# Patient Record
Sex: Male | Born: 1965
Health system: Southern US, Community
[De-identification: ages and names within clinical notes are randomized; demographics above are authoritative.]

## PROBLEM LIST (undated history)

## (undated) DIAGNOSIS — K219 Gastro-esophageal reflux disease without esophagitis: Secondary | ICD-10-CM

## (undated) DIAGNOSIS — Z8489 Family history of other specified conditions: Secondary | ICD-10-CM

## (undated) DIAGNOSIS — E785 Hyperlipidemia, unspecified: Secondary | ICD-10-CM

## (undated) DIAGNOSIS — G4730 Sleep apnea, unspecified: Secondary | ICD-10-CM

## (undated) DIAGNOSIS — M2669 Other specified disorders of temporomandibular joint: Secondary | ICD-10-CM

## (undated) DIAGNOSIS — R011 Cardiac murmur, unspecified: Secondary | ICD-10-CM

## (undated) DIAGNOSIS — F419 Anxiety disorder, unspecified: Secondary | ICD-10-CM

## (undated) DIAGNOSIS — E119 Type 2 diabetes mellitus without complications: Secondary | ICD-10-CM

## (undated) DIAGNOSIS — I10 Essential (primary) hypertension: Secondary | ICD-10-CM

## (undated) HISTORY — DX: Gastro-esophageal reflux disease without esophagitis: K21.9

## (undated) HISTORY — PX: COLONOSCOPY: SHX174

## (undated) HISTORY — DX: Other specified disorders of temporomandibular joint: M26.69

## (undated) HISTORY — DX: Hyperlipidemia, unspecified: E78.5

## (undated) HISTORY — DX: Essential (primary) hypertension: I10

## (undated) HISTORY — PX: WISDOM TOOTH EXTRACTION: SHX21

## (undated) HISTORY — DX: Type 2 diabetes mellitus without complications: E11.9

## (undated) HISTORY — DX: Cardiac murmur, unspecified: R01.1

---
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
## 2009-08-27 DIAGNOSIS — E785 Hyperlipidemia, unspecified: Secondary | ICD-10-CM

## 2009-08-27 HISTORY — DX: Hyperlipidemia, unspecified: E78.5

## 2011-07-09 ENCOUNTER — Telehealth: Payer: Self-pay | Admitting: Internal Medicine

## 2011-07-09 ENCOUNTER — Encounter: Payer: Self-pay | Admitting: Internal Medicine

## 2011-07-09 ENCOUNTER — Ambulatory Visit (INDEPENDENT_AMBULATORY_CARE_PROVIDER_SITE_OTHER): Payer: 59 | Admitting: Internal Medicine

## 2011-07-09 VITALS — BP 100/70 | HR 83 | Temp 98.3°F | Resp 18 | Ht 68.0 in | Wt 184.0 lb

## 2011-07-09 DIAGNOSIS — E785 Hyperlipidemia, unspecified: Secondary | ICD-10-CM

## 2011-07-09 DIAGNOSIS — Z79899 Other long term (current) drug therapy: Secondary | ICD-10-CM

## 2011-07-09 DIAGNOSIS — R229 Localized swelling, mass and lump, unspecified: Secondary | ICD-10-CM

## 2011-07-09 DIAGNOSIS — R2230 Localized swelling, mass and lump, unspecified upper limb: Secondary | ICD-10-CM

## 2011-07-09 DIAGNOSIS — I10 Essential (primary) hypertension: Secondary | ICD-10-CM

## 2011-07-09 HISTORY — PX: NO PAST SURGERIES: SHX2092

## 2011-07-09 NOTE — Telephone Encounter (Signed)
Lab orders entered for February 2013. 

## 2011-07-09 NOTE — Patient Instructions (Signed)
Please schedule cbc 401.9, chem7 v58.69 and lipid/lft 272.4 prior to next visit

## 2011-07-11 DIAGNOSIS — I10 Essential (primary) hypertension: Secondary | ICD-10-CM | POA: Insufficient documentation

## 2011-07-11 NOTE — Assessment & Plan Note (Signed)
Clinical suspicion for possible benign growth however given axillary location cannot exclude more serious etiology. Discussed further evaluation and recommend general surgery consult for consideration of possible excision.

## 2011-07-11 NOTE — Assessment & Plan Note (Signed)
Normotensive and stable. Continue current regimen. Monitor bp as outpt and followup in clinic as scheduled.  

## 2011-07-11 NOTE — Progress Notes (Signed)
  Subjective:    Patient ID: Shawn Perez, male    DOB: [birth year], 45 y.o.   MRN: [id number]  HPI Pt presents to clinic for evaluation of soft tissue mass. Notes one year h/o right axillary ST mass progressively increasing in size. Area is nontender and without drainage. Denies fever, sweats, fatigue or wt loss and notes no other areas of mass/enlargement. States has undergone ?CT of the area vs ct of chest apparently unremarkable. No alleviating or exacerbating factors. H/o HTN maintained on arb with good control and no side effects. H/o hyperlipidemia maintained on daily medication unspecified but thought to be a statin. No associated myalgias. Declines flu vaccine. No other complaints.  Past Medical History  Diagnosis Date  . Heart murmur     since birth  . Hypertension   . Hyperlipidemia 2011   Past Surgical History  Procedure Date  . No past surgeries 07/09/2011    Denies surgical history    reports that he has quit smoking. He has never used smokeless tobacco. He reports that he drinks alcohol. He reports that he does not use illicit drugs. family history includes Diabetes in his mother and Hypertension in his mother. Allergies  Allergen Reactions  . Penicillins     Rash       Review of Systems  Constitutional: Negative for fever, chills, diaphoresis, fatigue and unexpected weight change.  All other systems reviewed and are negative.       Objective:   Physical Exam  Nursing note and vitals reviewed. Constitutional: He appears well-developed and well-nourished.  HENT:  Head: Normocephalic and atraumatic.  Right Ear: External ear normal.  Left Ear: External ear normal.  Eyes: Conjunctivae are normal. No scleral icterus.  Neck: Neck supple.  Cardiovascular: Normal rate, regular rhythm and normal heart sounds.   Pulmonary/Chest: Effort normal and breath sounds normal. No respiratory distress. He has no wheezes. He has no rales.  Lymphadenopathy:    He has no  cervical adenopathy.  Neurological: He is alert.  Skin: Skin is warm and dry. He is not diaphoretic.       Right axilla: ST mass ~2cm. Mobile and nt. No expressible discharge. Well circumscribed.  Psychiatric: He has a normal mood and affect.          Assessment & Plan:

## 2011-07-24 ENCOUNTER — Ambulatory Visit (INDEPENDENT_AMBULATORY_CARE_PROVIDER_SITE_OTHER): Payer: 59 | Admitting: General Surgery

## 2011-07-30 ENCOUNTER — Ambulatory Visit (INDEPENDENT_AMBULATORY_CARE_PROVIDER_SITE_OTHER): Payer: 59 | Admitting: Surgery

## 2011-08-10 ENCOUNTER — Ambulatory Visit (INDEPENDENT_AMBULATORY_CARE_PROVIDER_SITE_OTHER): Payer: 59 | Admitting: Surgery

## 2011-08-13 ENCOUNTER — Encounter (INDEPENDENT_AMBULATORY_CARE_PROVIDER_SITE_OTHER): Payer: Self-pay | Admitting: Surgery

## 2011-08-13 ENCOUNTER — Ambulatory Visit (INDEPENDENT_AMBULATORY_CARE_PROVIDER_SITE_OTHER): Payer: 59 | Admitting: Surgery

## 2011-08-13 VITALS — BP 126/78 | HR 68 | Temp 97.4°F | Resp 18 | Ht 68.0 in | Wt 181.2 lb

## 2011-08-13 DIAGNOSIS — L723 Sebaceous cyst: Secondary | ICD-10-CM

## 2011-08-13 NOTE — Progress Notes (Signed)
Subjective:     Patient ID: Shawn Perez, male   DOB: 1966/04/04, 45 y.o.   MRN: [id number]  HPI This is a pleasant gentleman referred by Dr. Rodena Medin for evaluation of a small right axillary mass. The patient reports it has been there over a year. It has got slightly larger. It causes no discomfort. He has had no drainage. He is otherwise without complaints.  Review of Systems Unremarkable    Objective:   Physical Exam    On exam, his lungs are clear bilaterally. Cardiovascular is regular rate and rhythm. There is a small 1-1/2 cm subcutaneous nodule in the skin of the right axilla. It is consistent with a sebaceous cyst. There is no erythema. There is no axillary adenopathy Assessment:     Patient with right axilla sebaceous cyst    Plan:     I discussed the diagnosis with the patient. We are going to follow this expectantly as he has no symptoms. I did discuss removal with him. As this is superficial I believe I can do in the office. He will call me back if he wants me to remove the mass

## 2011-09-28 ENCOUNTER — Ambulatory Visit (INDEPENDENT_AMBULATORY_CARE_PROVIDER_SITE_OTHER): Payer: BC Managed Care – PPO | Admitting: Internal Medicine

## 2011-09-28 ENCOUNTER — Telehealth: Payer: Self-pay | Admitting: Internal Medicine

## 2011-09-28 ENCOUNTER — Encounter: Payer: Self-pay | Admitting: Internal Medicine

## 2011-09-28 VITALS — BP 110/62 | HR 76 | Temp 97.7°F | Resp 18 | Wt 181.0 lb

## 2011-09-28 DIAGNOSIS — R109 Unspecified abdominal pain: Secondary | ICD-10-CM

## 2011-09-28 DIAGNOSIS — E785 Hyperlipidemia, unspecified: Secondary | ICD-10-CM

## 2011-09-28 LAB — BASIC METABOLIC PANEL
BUN: 24 mg/dL — ABNORMAL HIGH (ref 6–23)
Calcium: 10.3 mg/dL (ref 8.4–10.5)
Creat: 1.17 mg/dL (ref 0.50–1.35)
Glucose, Bld: 96 mg/dL (ref 70–99)
Potassium: 4.3 mEq/L (ref 3.5–5.3)

## 2011-09-28 LAB — CBC WITH DIFFERENTIAL/PLATELET
Basophils Absolute: 0 10*3/uL (ref 0.0–0.1)
Basophils Relative: 0 % (ref 0–1)
Eosinophils Absolute: 0.1 10*3/uL (ref 0.0–0.7)
HCT: 43.7 % (ref 39.0–52.0)
Hemoglobin: 14.6 g/dL (ref 13.0–17.0)
MCH: 31.1 pg (ref 26.0–34.0)
MCHC: 33.4 g/dL (ref 30.0–36.0)
Monocytes Absolute: 0.6 10*3/uL (ref 0.1–1.0)
Monocytes Relative: 9 % (ref 3–12)
Neutro Abs: 3.8 10*3/uL (ref 1.7–7.7)
RDW: 12.3 % (ref 11.5–15.5)

## 2011-09-28 MED ORDER — VALSARTAN 160 MG PO TABS: 160.0000 mg | ORAL_TABLET | Freq: Every day | ORAL | Status: DC

## 2011-09-28 MED ORDER — CHOLINE FENOFIBRATE 135 MG PO CPDR: 135.0000 mg | DELAYED_RELEASE_CAPSULE | Freq: Every day | ORAL | Status: DC

## 2011-09-28 MED ORDER — LEVOFLOXACIN 500 MG PO TABS: 500.0000 mg | ORAL_TABLET | Freq: Every day | ORAL | Status: AC

## 2011-09-28 NOTE — Patient Instructions (Signed)
Please schedule lipid/lft 272.4 prior to next visit  

## 2011-09-29 LAB — URINALYSIS, ROUTINE W REFLEX MICROSCOPIC
Bilirubin Urine: NEGATIVE
Nitrite: NEGATIVE
Protein, ur: NEGATIVE mg/dL
Urobilinogen, UA: 1 mg/dL (ref 0.0–1.0)

## 2011-10-01 ENCOUNTER — Encounter: Payer: Self-pay | Admitting: Internal Medicine

## 2011-10-04 ENCOUNTER — Telehealth: Payer: Self-pay | Admitting: *Deleted

## 2011-10-04 NOTE — Telephone Encounter (Signed)
Ben called back stating he checked with pt's previous pharmacy and verified they have not filled plain Diovan in the last 6 months. Last refill was 09/12/11 for diovan HCT 160/12.5mg . Please advise.

## 2011-10-04 NOTE — Telephone Encounter (Signed)
Received call from [place] at [facility] pharmacy stating pt told him he is supposed to be taking Diovan HCT 160/12.5mg  instead of Diovan 160mg . Historical medication shows that Diovan HCT was removed from med list in November (reason: error?) and Diovan 160mg  was added in its place. Please advise if it is ok to change med list to Diovan HCT or should pt really be taking Diovan 160mg ? Please advise.

## 2011-10-05 MED ORDER — VALSARTAN-HYDROCHLOROTHIAZIDE 160-12.5 MG PO TABS: 1.0000 | ORAL_TABLET | Freq: Every day | ORAL | Status: DC

## 2011-10-05 NOTE — Telephone Encounter (Signed)
Refill sent to pharmacy for Diovan HCT and medication list has been updated.

## 2011-10-05 NOTE — Telephone Encounter (Signed)
pls fill as diovan hct what he has been receiving from the pharmacy. Changed 11/18 by ?cna i don't recognize

## 2011-10-07 DIAGNOSIS — R109 Unspecified abdominal pain: Secondary | ICD-10-CM | POA: Insufficient documentation

## 2011-10-07 NOTE — Assessment & Plan Note (Signed)
Obtain cbc, chem7, ua. Attempt empiric levaquin x10d. Schedule close follow up.

## 2011-10-07 NOTE — Progress Notes (Signed)
  Subjective:    Patient ID: Shawn Perez, male    DOB: 26-Jul-1966, 46 y.o.   MRN: [id number]  HPI Pt presents to clinic for evaluation of back pain. Notes 34month h/o right lbp that radiates to groin. Initially began as rlq/inguinal pain that radiated to right testicle/scrotum. Then progressed to rlb pain with possible mild radiation down right leg without leg weakness, injury or paresthesia. Denies f/c, urethral discharge, or urinary sx's. No other alleviating or exacerbating factors. No other complaints.  Past Medical History  Diagnosis Date  . Heart murmur     since birth  . Hypertension   . Hyperlipidemia 2011   Past Surgical History  Procedure Date  . No past surgeries 07/09/2011    Denies surgical history    reports that he has quit smoking. He has never used smokeless tobacco. He reports that he drinks alcohol. He reports that he does not use illicit drugs. family history includes Diabetes in his mother and Hypertension in his mother. Allergies  Allergen Reactions  . Penicillins     Rash     Review of Systems see hpi     Objective:   Physical Exam  Nursing note and vitals reviewed. Constitutional: He appears well-developed and well-nourished. No distress.  HENT:  Head: Normocephalic and atraumatic.  Right Ear: External ear normal.  Left Ear: External ear normal.  Eyes: Conjunctivae are normal. No scleral icterus.  Neck: Neck supple.  Abdominal: Soft. Bowel sounds are normal. He exhibits no distension and no mass. There is no tenderness. There is no rebound and no guarding.  Genitourinary:       Possible mild discomfort to palptation along right epididymis. No testicular mass  Neurological: He is alert.  Skin: Skin is warm and dry. He is not diaphoretic.  Psychiatric: He has a normal mood and affect.          Assessment & Plan:

## 2011-10-07 NOTE — Assessment & Plan Note (Signed)
Obtain lipid/lft prior to next visit 

## 2011-10-08 ENCOUNTER — Ambulatory Visit: Payer: 59 | Admitting: Internal Medicine

## 2011-10-09 ENCOUNTER — Telehealth (INDEPENDENT_AMBULATORY_CARE_PROVIDER_SITE_OTHER): Payer: Self-pay | Admitting: Surgery

## 2011-10-09 ENCOUNTER — Encounter (INDEPENDENT_AMBULATORY_CARE_PROVIDER_SITE_OTHER): Payer: Self-pay | Admitting: Surgery

## 2011-10-09 ENCOUNTER — Ambulatory Visit (INDEPENDENT_AMBULATORY_CARE_PROVIDER_SITE_OTHER): Payer: BC Managed Care – PPO | Admitting: Surgery

## 2011-10-09 VITALS — BP 108/66 | HR 70 | Temp 97.8°F | Resp 18 | Ht 68.0 in | Wt 182.6 lb

## 2011-10-09 DIAGNOSIS — L089 Local infection of the skin and subcutaneous tissue, unspecified: Secondary | ICD-10-CM

## 2011-10-09 DIAGNOSIS — L723 Sebaceous cyst: Secondary | ICD-10-CM

## 2011-10-09 NOTE — Progress Notes (Signed)
Subjective:     Patient ID: Shawn Perez, male   DOB: 1966/03/23, 46 y.o.   MRN: [id number]  HPI This is a gentleman that I saw with an asymptomatic sebaceous cyst in his right axilla last year. He now presents with an infected.  Review of Systems     Objective:   Physical Exam On examination, there is erythema and abscess with infected sebaceous cyst in the right axilla    Assessment:     Infected sebaceous cyst    Plan:     After obtaining consent, I cleaned the area Betadine. I then anesthetized with lidocaine. I made an incision with the scalpel. I then drained the purulence and sebaceous debris. I was also able to excise a significant amount of the capsule. I then packed the wound with gauze. I did start him on antibiotics. I also wrote for hydrocodone. I will see him back next Monday.

## 2011-10-15 ENCOUNTER — Encounter (INDEPENDENT_AMBULATORY_CARE_PROVIDER_SITE_OTHER): Payer: Self-pay | Admitting: Surgery

## 2011-10-15 ENCOUNTER — Ambulatory Visit (INDEPENDENT_AMBULATORY_CARE_PROVIDER_SITE_OTHER): Payer: BC Managed Care – PPO | Admitting: Surgery

## 2011-10-15 VITALS — BP 128/80 | HR 77 | Temp 97.5°F | Resp 18 | Ht 68.0 in | Wt 184.2 lb

## 2011-10-15 DIAGNOSIS — Z09 Encounter for follow-up examination after completed treatment for conditions other than malignant neoplasm: Secondary | ICD-10-CM

## 2011-10-15 NOTE — Progress Notes (Signed)
Subjective:     Patient ID: Shawn Perez, male   DOB: 1966/07/08, 46 y.o.   MRN: [id number]  HPI He is here today for recheck of the infected sebaceous cyst which was drained last week. He is doing well and has no complaints. He is finishing his course of antibiotics  Review of Systems     Objective:   Physical Exam On exam, the incision is healing well. There is no erythema or purulence    Assessment:     Infected sebaceous cyst status post incision and drainage    Plan:     He will see me back in one month. If the area of recurrence, he will need to be formally excised

## 2011-10-16 ENCOUNTER — Other Ambulatory Visit: Payer: Self-pay | Admitting: *Deleted

## 2011-10-16 ENCOUNTER — Encounter: Payer: Self-pay | Admitting: Internal Medicine

## 2011-10-16 ENCOUNTER — Ambulatory Visit (INDEPENDENT_AMBULATORY_CARE_PROVIDER_SITE_OTHER): Payer: BC Managed Care – PPO | Admitting: Internal Medicine

## 2011-10-16 ENCOUNTER — Telehealth: Payer: Self-pay | Admitting: Internal Medicine

## 2011-10-16 VITALS — BP 100/60 | HR 48 | Temp 97.7°F | Resp 16 | Ht 68.0 in | Wt 182.0 lb

## 2011-10-16 DIAGNOSIS — Z79899 Other long term (current) drug therapy: Secondary | ICD-10-CM

## 2011-10-16 DIAGNOSIS — N419 Inflammatory disease of prostate, unspecified: Secondary | ICD-10-CM

## 2011-10-16 DIAGNOSIS — E785 Hyperlipidemia, unspecified: Secondary | ICD-10-CM

## 2011-10-16 DIAGNOSIS — I10 Essential (primary) hypertension: Secondary | ICD-10-CM

## 2011-10-16 DIAGNOSIS — I499 Cardiac arrhythmia, unspecified: Secondary | ICD-10-CM

## 2011-10-16 LAB — LIPID PANEL
Cholesterol: 187 mg/dL (ref 0–200)
Triglycerides: 185 mg/dL — ABNORMAL HIGH (ref <150)
VLDL: 37 mg/dL (ref 0–40)

## 2011-10-16 LAB — BASIC METABOLIC PANEL
BUN: 23 mg/dL (ref 6–23)
Creat: 1.25 mg/dL (ref 0.50–1.35)
Potassium: 4.9 mEq/L (ref 3.5–5.3)

## 2011-10-16 LAB — HEPATIC FUNCTION PANEL
ALT: 30 U/L (ref 0–53)
AST: 23 U/L (ref 0–37)
Alkaline Phosphatase: 50 U/L (ref 39–117)
Indirect Bilirubin: 0.4 mg/dL (ref 0.0–0.9)
Total Protein: 7 g/dL (ref 6.0–8.3)

## 2011-10-16 NOTE — Telephone Encounter (Signed)
Lab orders entered for July 2013. 

## 2011-10-16 NOTE — Progress Notes (Signed)
  Subjective:    Patient ID: Shawn Perez, male    DOB: 19-Aug-1966, 46 y.o.   MRN: [id number]  HPI Pt presents to clinic for followup of multiple medical problems. Recently has infected sebaceous cyst now s/p I&D by surgery. Near the end of course of septra per surgery-notes mild nausea with medication. Seen two weeks ago with possible prostatitis vs epididymitis tx'ed with 10 day course of levaquin. Tolerated medication without adverse effect. Sx's resolved 2-3 days ago. No back pain, testicular or groin pain. Heartbeat noted with brief irregularity. asx without palpitations.   Past Medical History  Diagnosis Date  . Heart murmur     since birth  . Hypertension   . Hyperlipidemia 2011  . Sebaceous cyst     axillary   Past Surgical History  Procedure Date  . No past surgeries 07/09/2011    Denies surgical history    reports that he has quit smoking. He has never used smokeless tobacco. He reports that he drinks alcohol. He reports that he does not use illicit drugs. family history includes Diabetes in his mother and Hypertension in his mother. Allergies  Allergen Reactions  . Penicillins Rash    Happened in childhood      Review of Systems see hpi     Objective:   Physical Exam  Nursing note and vitals reviewed. Constitutional: He appears well-developed and well-nourished.  HENT:  Head: Normocephalic and atraumatic.  Neurological: He is alert.  Psychiatric: He has a normal mood and affect.          Assessment & Plan:

## 2011-10-16 NOTE — Assessment & Plan Note (Signed)
Resolved s/p abx. °

## 2011-10-16 NOTE — Assessment & Plan Note (Signed)
Obtain lipid/lft. 

## 2011-10-16 NOTE — Assessment & Plan Note (Signed)
EKG obtained shows NSR with nl intervals and axis. No arrythmia. Pt asx without palpitations. May have represented transient pac or pvc. If develops palpitations proceed with cardiac monitor.

## 2011-10-16 NOTE — Patient Instructions (Signed)
Please schedule chem7-v58.69 and lipid/lft 272.4 prior to next visit 

## 2011-10-17 LAB — CBC
MCV: 92.1 fL (ref 78.0–100.0)
Platelets: 318 10*3/uL (ref 150–400)
RDW: 12.6 % (ref 11.5–15.5)
WBC: 4.4 10*3/uL (ref 4.0–10.5)

## 2011-10-24 ENCOUNTER — Other Ambulatory Visit: Payer: Self-pay | Admitting: Internal Medicine

## 2011-10-24 DIAGNOSIS — R739 Hyperglycemia, unspecified: Secondary | ICD-10-CM

## 2011-11-06 ENCOUNTER — Encounter (INDEPENDENT_AMBULATORY_CARE_PROVIDER_SITE_OTHER): Payer: BC Managed Care – PPO | Admitting: Surgery

## 2011-11-30 ENCOUNTER — Ambulatory Visit (INDEPENDENT_AMBULATORY_CARE_PROVIDER_SITE_OTHER): Payer: BC Managed Care – PPO | Admitting: Surgery

## 2011-11-30 ENCOUNTER — Encounter (INDEPENDENT_AMBULATORY_CARE_PROVIDER_SITE_OTHER): Payer: Self-pay | Admitting: Surgery

## 2011-11-30 VITALS — BP 122/81 | HR 68 | Temp 97.4°F | Resp 14 | Ht 68.0 in | Wt 180.4 lb

## 2011-11-30 DIAGNOSIS — L723 Sebaceous cyst: Secondary | ICD-10-CM

## 2011-11-30 NOTE — Progress Notes (Signed)
Subjective:     Patient ID: Shawn Perez, male   DOB: 1966-07-14, 46 y.o.   MRN: [id number]  HPI He is doing well and has no complaints. He does not feel that the cyst is recurred  Review of Systems     Objective:   Physical Exam On exam, the incision in the axilla is well healed. There is no evidence of recurrent sebaceous cyst    Assessment:     Patient status post incision and drainage of sebaceous cyst    Plan:     I will see him back as needed. He will return should he notice a cyst

## 2011-12-13 ENCOUNTER — Telehealth: Payer: Self-pay | Admitting: *Deleted

## 2011-12-13 MED ORDER — LEVOFLOXACIN 500 MG PO TABS: 500.0000 mg | ORAL_TABLET | Freq: Every day | ORAL | Status: AC

## 2011-12-13 NOTE — Telephone Encounter (Signed)
Rx sent to pharmacy and pt notified  ?

## 2011-12-13 NOTE — Telephone Encounter (Signed)
Received message from pt stating his UTI has returned. Pt states he was told that if his symptoms returned to call us and we would send in an abx. Please advise.

## 2011-12-13 NOTE — Telephone Encounter (Signed)
levaquin 500mg  po qd x 10d

## 2011-12-26 ENCOUNTER — Telehealth: Payer: Self-pay | Admitting: *Deleted

## 2011-12-26 NOTE — Telephone Encounter (Signed)
Received call from pt's wife stating pt reported episode yesterday of sharp chest pain radiating to left arm. Reports family history of heart disease. Pt's wife states pt is at work today but wanted to schedule an appt for tomorrow.  Pt reported that he has only had some chest discomfort today, denies n/v or shortness of breath. Discussed request with Dr Rodena Medin, he advised that it is ok to give pt appt for tomorrow but advised pt's wife that pt should be evaluated in the ER if he has further chest pain and not to wait until appt. Pt's wife voices understanding.

## 2011-12-27 ENCOUNTER — Ambulatory Visit (INDEPENDENT_AMBULATORY_CARE_PROVIDER_SITE_OTHER): Payer: BC Managed Care – PPO | Admitting: Internal Medicine

## 2011-12-27 ENCOUNTER — Ambulatory Visit (HOSPITAL_BASED_OUTPATIENT_CLINIC_OR_DEPARTMENT_OTHER)
Admission: RE | Admit: 2011-12-27 | Discharge: 2011-12-27 | Disposition: A | Discharge status: Home / Self Care | Payer: BC Managed Care – PPO | Source: Ambulatory Visit | Attending: Internal Medicine | Admitting: Internal Medicine

## 2011-12-27 ENCOUNTER — Encounter: Payer: Self-pay | Admitting: Internal Medicine

## 2011-12-27 DIAGNOSIS — R011 Cardiac murmur, unspecified: Secondary | ICD-10-CM

## 2011-12-27 DIAGNOSIS — R079 Chest pain, unspecified: Secondary | ICD-10-CM

## 2011-12-27 DIAGNOSIS — R0602 Shortness of breath: Secondary | ICD-10-CM

## 2011-12-27 DIAGNOSIS — I10 Essential (primary) hypertension: Secondary | ICD-10-CM | POA: Insufficient documentation

## 2011-12-27 MED ORDER — ESOMEPRAZOLE MAGNESIUM 40 MG PO CPDR: 40.0000 mg | DELAYED_RELEASE_CAPSULE | Freq: Every day | ORAL | Status: DC

## 2011-12-27 NOTE — Progress Notes (Signed)
  Subjective:    Patient ID: Shawn Perez, male    DOB: Aug 18, 1966, 46 y.o.   MRN: [id number]  HPI Pt presents to clinic for evaluation of chest pain. Notes 3 d h/o intermittent left sided chest pain. Is non exertional without associated dyspnea, n/v or diaphoresis. Did on one occasion radiate down left upper arm. Duration usually several minutes followed by spontaneous resolution. One occasion belching helped relieve the discomfort. Denies GERD sx's. CAD risk factors include h/o HTN and hyperlipidemia. Currently without sx's.  Past Medical History  Diagnosis Date  . Heart murmur     since birth  . Hypertension   . Hyperlipidemia 2011  . Sebaceous cyst     axillary   Past Surgical History  Procedure Date  . No past surgeries 07/09/2011    Denies surgical history    reports that he has quit smoking. He has never used smokeless tobacco. He reports that he drinks alcohol. He reports that he does not use illicit drugs. family history includes Diabetes in his mother and Hypertension in his mother. Allergies  Allergen Reactions  . Penicillins Rash    Happened in childhood       Review of Systems see hpi     Objective:   Physical Exam  Physical Exam  Nursing note and vitals reviewed. Constitutional: Appears well-developed and well-nourished. No distress.  HENT:  Head: Normocephalic and atraumatic.  Right Ear: External ear normal.  Left Ear: External ear normal.  Eyes: Conjunctivae are normal. No scleral icterus.  Neck: Neck supple. Carotid bruit is not present.  Cardiovascular: Normal rate, regular rhythm and normal heart sounds.  Exam reveals no gallop and no friction rub.   No murmur heard. Pulmonary/Chest: Effort normal and breath sounds normal. No respiratory distress. He has no wheezes. no rales.  Lymphadenopathy:    He has no cervical adenopathy.  Neurological:Alert.  Skin: Skin is warm and dry. Not diaphoretic.  Psychiatric: Has a normal mood and affect.          Assessment & Plan:

## 2011-12-27 NOTE — Assessment & Plan Note (Signed)
Currently asx. EKG obtained shows nsr 63 with nl intervals and axis. No acute evidence of ischemic change. Begin asa 325mg  qd. Obtain CXR. Schedule nuclear stress test for further evaluation. Given nexium 40mg  qd samples #15. Close f/u scheduled.

## 2011-12-27 NOTE — Patient Instructions (Signed)
We are in the process of scheduling your heart stress test

## 2012-01-03 ENCOUNTER — Ambulatory Visit: Payer: BC Managed Care – PPO | Admitting: Internal Medicine

## 2012-01-03 ENCOUNTER — Encounter (HOSPITAL_COMMUNITY): Payer: BC Managed Care – PPO

## 2012-01-08 ENCOUNTER — Ambulatory Visit (HOSPITAL_COMMUNITY): Payer: BC Managed Care – PPO | Attending: Internal Medicine | Admitting: Radiology

## 2012-01-08 DIAGNOSIS — Z87891 Personal history of nicotine dependence: Secondary | ICD-10-CM | POA: Insufficient documentation

## 2012-01-08 DIAGNOSIS — M79609 Pain in unspecified limb: Secondary | ICD-10-CM | POA: Insufficient documentation

## 2012-01-08 DIAGNOSIS — E785 Hyperlipidemia, unspecified: Secondary | ICD-10-CM | POA: Insufficient documentation

## 2012-01-08 DIAGNOSIS — Z8249 Family history of ischemic heart disease and other diseases of the circulatory system: Secondary | ICD-10-CM | POA: Insufficient documentation

## 2012-01-08 DIAGNOSIS — R002 Palpitations: Secondary | ICD-10-CM | POA: Insufficient documentation

## 2012-01-08 DIAGNOSIS — I10 Essential (primary) hypertension: Secondary | ICD-10-CM | POA: Insufficient documentation

## 2012-01-08 DIAGNOSIS — R0602 Shortness of breath: Secondary | ICD-10-CM

## 2012-01-08 DIAGNOSIS — R0609 Other forms of dyspnea: Secondary | ICD-10-CM | POA: Insufficient documentation

## 2012-01-08 DIAGNOSIS — R079 Chest pain, unspecified: Secondary | ICD-10-CM

## 2012-01-08 DIAGNOSIS — I4949 Other premature depolarization: Secondary | ICD-10-CM

## 2012-01-08 DIAGNOSIS — R0989 Other specified symptoms and signs involving the circulatory and respiratory systems: Secondary | ICD-10-CM | POA: Insufficient documentation

## 2012-01-08 DIAGNOSIS — R110 Nausea: Secondary | ICD-10-CM | POA: Insufficient documentation

## 2012-01-08 MED ORDER — TECHNETIUM TC 99M TETROFOSMIN IV KIT
33.0000 | PACK | Freq: Once | INTRAVENOUS | Status: AC | PRN
  Administered 2012-01-08: 33 via INTRAVENOUS

## 2012-01-08 MED ORDER — TECHNETIUM TC 99M TETROFOSMIN IV KIT
11.0000 | PACK | Freq: Once | INTRAVENOUS | Status: AC | PRN
  Administered 2012-01-08: 11 via INTRAVENOUS

## 2012-01-08 NOTE — Progress Notes (Signed)
[facility] SITE 3 NUCLEAR MED [address] [place] [address] [phone]  Cardiology Nuclear Med Study  Shawn Perez is a 46 y.o. male     MRN : [id number]     DOB: Nov 26, 1965  Procedure Date: 01/08/2012  Nuclear Med Background Indication for Stress Test:  Evaluation for Ischemia History:  ~3-4 yrs ago GXT:OK per patient Cardiac Risk Factors: Family History - CAD, History of Smoking, Hypertension and Lipids  Symptoms:  Chest Pain>(L) Arm (last episode of chest discomfort was Saturday, 01/05/12), DOE, Nausea and Palpitations   Nuclear Pre-Procedure Caffeine/Decaff Intake:  None NPO After: 7:30pm   Lungs:  clear O2 Sat: 97% on room air. IV 0.9% NS with Angio Cath:  20g  IV Site: R Hand  IV Started by:  Cathlyn Parsons, RN  Chest Size (in):  42 Cup Size: n/a  Height: 5\' 8"  (1.727 m)  Weight:  178 lb (80.74 kg)  BMI:  Body mass index is 27.06 kg/(m^2). Tech Comments:  n/a    Nuclear Med Study 1 or 2 day study: 1 day  Stress Test Type:  Stress  Reading MD: Marca Ancona, MD  Order Authorizing Provider:  Clifton Custard, MD  Resting Radionuclide: Technetium 98m Tetrofosmin  Resting Radionuclide Dose: 11.0 mCi   Stress Radionuclide:  Technetium 86m Tetrofosmin  Stress Radionuclide Dose: 33.0 mCi           Stress Protocol Rest HR: 70 Stress HR: 153  Rest BP: 114/67 Stress BP: 179/72  Exercise Time (min): 10:00 METS: 11.7   Predicted Max HR: 173 bpm % Max HR: 88.44 bpm Rate Pressure Product: [id number]   Dose of Adenosine (mg):  n/a Dose of Lexiscan: n/a mg  Dose of Atropine (mg): n/a Dose of Dobutamine: n/a mcg/kg/min (at max HR)  Stress Test Technologist: Smiley Houseman, CMA-N  Nuclear Technologist:  Domenic Polite, CNMT     Rest Procedure:  Myocardial perfusion imaging was performed at rest 45 minutes following the intravenous administration of Technetium 44m Tetrofosmin.  Rest ECG: Nonspecific ST-T wave changes, rare PVC noted.  Stress  Procedure:  The patient exercised on the treadmill utilizing the Bruce protocol for ten minutes. He then stopped due to fatigue and denied any chest pain.  There were nonspecific ST-T wave changes and occasional PVC's/PAC's were noted.  Technetium 11m Tetrofosmin was injected at peak exercise and myocardial perfusion imaging was performed after a brief delay.  Stress ECG: Insignificant upsloping ST segment depression.  QPS Raw Data Images:  Normal; no motion artifact; normal heart/lung ratio. Stress Images:  Normal homogeneous uptake in all areas of the myocardium. Rest Images:  Normal homogeneous uptake in all areas of the myocardium. Subtraction (SDS):  There is no evidence of scar or ischemia. Transient Ischemic Dilatation (Normal <1.22):  0.95 Lung/Heart Ratio (Normal <0.45):  0.28  Quantitative Gated Spect Images QGS EDV:  71 ml QGS ESV:  18 ml  Impression Exercise Capacity:  Good exercise capacity. BP Response:  Normal blood pressure response. Clinical Symptoms:  Fatigue, no chest pain.  ECG Impression:  Insignificant upsloping ST segment depression. Comparison with Prior Nuclear Study: No previous nuclear study performed  Overall Impression:  Normal stress nuclear study.  LV Ejection Fraction: 75%.  LV Wall Motion:  NL LV Function; NL Wall Motion  [employer]

## 2012-01-10 ENCOUNTER — Telehealth: Payer: Self-pay | Admitting: *Deleted

## 2012-01-10 NOTE — Telephone Encounter (Signed)
Call placed to patient he was informed per Dr Rodena Medin instructions.

## 2012-01-10 NOTE — Telephone Encounter (Signed)
Message copied by Glendell Docker on Thu Jan 10, 2012 11:01 AM ------      Message from: Staci Righter.      Created: Thu Jan 10, 2012 10:07 AM       pls notify stress test nl

## 2012-02-25 ENCOUNTER — Telehealth: Payer: Self-pay | Admitting: Internal Medicine

## 2012-02-25 MED ORDER — LEVOFLOXACIN 500 MG PO TABS: 500.0000 mg | ORAL_TABLET | Freq: Every day | ORAL | Status: AC

## 2012-02-25 NOTE — Telephone Encounter (Signed)
levaquin 500mg  po qd x10d. If keeps having sx's then may need to talk about further evaluation

## 2012-02-25 NOTE — Telephone Encounter (Signed)
THE PATIENT STATES  DR HODGIN SAID IF HE DEVELOPED A UTI AGAIN HE WOULD CALL HIM IN AN RX

## 2012-02-25 NOTE — Telephone Encounter (Signed)
Rx to pharmacy; patient informed/SLS 

## 2012-03-11 ENCOUNTER — Ambulatory Visit: Payer: BC Managed Care – PPO | Admitting: Internal Medicine

## 2012-03-13 ENCOUNTER — Ambulatory Visit (INDEPENDENT_AMBULATORY_CARE_PROVIDER_SITE_OTHER): Payer: BC Managed Care – PPO | Admitting: Internal Medicine

## 2012-03-13 ENCOUNTER — Encounter: Payer: Self-pay | Admitting: Internal Medicine

## 2012-03-13 VITALS — BP 116/72 | HR 67 | Temp 97.8°F | Resp 16 | Wt 179.0 lb

## 2012-03-13 DIAGNOSIS — I10 Essential (primary) hypertension: Secondary | ICD-10-CM

## 2012-03-13 DIAGNOSIS — K219 Gastro-esophageal reflux disease without esophagitis: Secondary | ICD-10-CM

## 2012-03-13 DIAGNOSIS — E785 Hyperlipidemia, unspecified: Secondary | ICD-10-CM

## 2012-03-13 DIAGNOSIS — F419 Anxiety disorder, unspecified: Secondary | ICD-10-CM

## 2012-03-13 DIAGNOSIS — R7309 Other abnormal glucose: Secondary | ICD-10-CM

## 2012-03-13 DIAGNOSIS — R739 Hyperglycemia, unspecified: Secondary | ICD-10-CM

## 2012-03-13 DIAGNOSIS — Z79899 Other long term (current) drug therapy: Secondary | ICD-10-CM

## 2012-03-13 DIAGNOSIS — F411 Generalized anxiety disorder: Secondary | ICD-10-CM

## 2012-03-13 LAB — HEPATIC FUNCTION PANEL
ALT: 20 U/L (ref 0–53)
AST: 17 U/L (ref 0–37)
Albumin: 4.7 g/dL (ref 3.5–5.2)
Alkaline Phosphatase: 44 U/L (ref 39–117)
Indirect Bilirubin: 0.5 mg/dL (ref 0.0–0.9)
Total Protein: 7 g/dL (ref 6.0–8.3)

## 2012-03-13 LAB — HEMOGLOBIN A1C
Hgb A1c MFr Bld: 6.2 % — ABNORMAL HIGH (ref <5.7)
Mean Plasma Glucose: 131 mg/dL — ABNORMAL HIGH (ref <117)

## 2012-03-13 MED ORDER — ESOMEPRAZOLE MAGNESIUM 40 MG PO CPDR: 40.0000 mg | DELAYED_RELEASE_CAPSULE | Freq: Every day | ORAL | Status: DC

## 2012-03-13 NOTE — Patient Instructions (Signed)
5-HTP or theanine may help with anxiety. Both are over the counter. Please schedule fasting labs prior to next visit Chem-v58.69 and lipid/lft-272.4

## 2012-03-14 DIAGNOSIS — F419 Anxiety disorder, unspecified: Secondary | ICD-10-CM | POA: Insufficient documentation

## 2012-03-14 DIAGNOSIS — K219 Gastro-esophageal reflux disease without esophagitis: Secondary | ICD-10-CM | POA: Insufficient documentation

## 2012-03-14 LAB — BASIC METABOLIC PANEL
Calcium: 10.1 mg/dL (ref 8.4–10.5)
Chloride: 101 mEq/L (ref 96–112)
Creat: 1.07 mg/dL (ref 0.50–1.35)
Sodium: 138 mEq/L (ref 135–145)

## 2012-03-14 LAB — LIPID PANEL: LDL Cholesterol: 128 mg/dL — ABNORMAL HIGH (ref 0–99)

## 2012-03-14 NOTE — Progress Notes (Signed)
  Subjective:    Patient ID: Shawn Perez, male    DOB: February 13, 1966, 46 y.o.   MRN: [id number]  HPI Pt presents to clinic for followup of multiple medical problems. Recent episode of presumed prostatitis resolved with course of levaquin. BP reviewed normotensive. Notes intermittent anxiety without obvious trigger. Not taking medication for the problem. Plans on beginning nexium today for GERD sx's.   Past Medical History  Diagnosis Date  . Heart murmur     since birth  . Hypertension   . Hyperlipidemia 2011  . Sebaceous cyst     axillary   Past Surgical History  Procedure Date  . No past surgeries 07/09/2011    Denies surgical history    reports that he has quit smoking. He has never used smokeless tobacco. He reports that he drinks alcohol. He reports that he does not use illicit drugs. family history includes Diabetes in his mother and Hypertension in his mother. Allergies  Allergen Reactions  . Penicillins Rash    Happened in childhood      Review of Systems see hpi     Objective:   Physical Exam  Physical Exam  Nursing note and vitals reviewed. Constitutional: Appears well-developed and well-nourished. No distress.  HENT:  Head: Normocephalic and atraumatic.  Right Ear: External ear normal.  Left Ear: External ear normal.  Eyes: Conjunctivae are normal. No scleral icterus.  Neck: Neck supple. Carotid bruit is not present.  Cardiovascular: Normal rate, regular rhythm and normal heart sounds.  Exam reveals no gallop and no friction rub.   No murmur heard. Pulmonary/Chest: Effort normal and breath sounds normal. No respiratory distress. He has no wheezes. no rales.  Lymphadenopathy:    He has no cervical adenopathy.  Neurological:Alert.  Skin: Skin is warm and dry. Not diaphoretic.  Psychiatric: Has a normal mood and affect.        Assessment & Plan:

## 2012-03-14 NOTE — Assessment & Plan Note (Signed)
Obtain lipid/lft. 

## 2012-03-14 NOTE — Assessment & Plan Note (Signed)
Discussed treatment options. Currently wishes to avoid prescription medication. Attempt either otc 5HTP or theanine. Followup if no improvement or worsening.

## 2012-03-14 NOTE — Assessment & Plan Note (Signed)
Obtain chem7, a1c 

## 2012-03-14 NOTE — Assessment & Plan Note (Signed)
Normotensive and stable. Continue current regimen. Monitor bp as outpt and followup in clinic as scheduled.  

## 2012-03-24 ENCOUNTER — Telehealth: Payer: Self-pay | Admitting: *Deleted

## 2012-03-24 DIAGNOSIS — Z79899 Other long term (current) drug therapy: Secondary | ICD-10-CM

## 2012-03-24 DIAGNOSIS — E785 Hyperlipidemia, unspecified: Secondary | ICD-10-CM

## 2012-03-24 DIAGNOSIS — R7309 Other abnormal glucose: Secondary | ICD-10-CM

## 2012-03-24 NOTE — Telephone Encounter (Signed)
Patient informed; understood & agreed; future lab orders placed w/added A1C/SLS

## 2012-03-24 NOTE — Telephone Encounter (Signed)
Message copied by Regis Bill on Mon Mar 24, 2012  4:02 PM ------      Message from: Edwyna Perfect      Created: Sat Mar 22, 2012  8:18 AM       Chol and sugar mildly elevated. Low sugar/carb/fat diet, exercise and weight loss. pls add a1c (hyperglycemia) to next visit labs

## 2012-05-05 ENCOUNTER — Other Ambulatory Visit: Payer: Self-pay | Admitting: Internal Medicine

## 2012-05-06 NOTE — Telephone Encounter (Signed)
Rx done/SLS 

## 2012-06-06 ENCOUNTER — Other Ambulatory Visit: Payer: Self-pay | Admitting: *Deleted

## 2012-06-06 MED ORDER — VALSARTAN-HYDROCHLOROTHIAZIDE 160-12.5 MG PO TABS: 1.0000 | ORAL_TABLET | Freq: Every day | ORAL | Status: DC

## 2012-06-10 ENCOUNTER — Other Ambulatory Visit: Payer: Self-pay | Admitting: Internal Medicine

## 2012-09-08 ENCOUNTER — Telehealth: Payer: Self-pay | Admitting: *Deleted

## 2012-09-08 NOTE — Telephone Encounter (Signed)
Received call from [place] at [facility] pharmacy stating pt's insurance is requiring prior authorization for Brand name Diovan HCT. He has faxed form to [place]. Also states that Trilipix is no longer on pt's formulary and pt is requesting a cheaper alternative. He states that Antara 130mg  would be a cheaper alternative for him.  Please advise if this is an appropriate substitution?

## 2012-09-08 NOTE — Telephone Encounter (Signed)
Ok change to antara (fenofibrate) 130mg  qd. Can diovan hct be changed to diovan and hct?

## 2012-09-09 MED ORDER — VALSARTAN 160 MG PO TABS: 160.0000 mg | ORAL_TABLET | Freq: Every day | ORAL | Status: DC

## 2012-09-09 MED ORDER — HYDROCHLOROTHIAZIDE 12.5 MG PO CAPS: 12.5000 mg | ORAL_CAPSULE | Freq: Every day | ORAL | Status: DC

## 2012-09-09 MED ORDER — FENOFIBRATE MICRONIZED 130 MG PO CAPS: 130.0000 mg | ORAL_CAPSULE | Freq: Every day | ORAL | Status: DC

## 2012-09-09 NOTE — Telephone Encounter (Signed)
Instructions given to [place] at pharmacy. She will run scripts and will let us know if brand name Diovan HCT will be covered by insurance if broken down to individual meds.

## 2012-09-09 NOTE — Telephone Encounter (Signed)
Spoke with Phoebe Sharps, She states brand name Diovan will be $4 and HCTZ will be $10. Antara will also have $10 co-pay and will be available for pt tomorrow. Notified pt and he is agreeable to these changes.

## 2012-09-11 ENCOUNTER — Ambulatory Visit: Payer: BC Managed Care – PPO | Admitting: Internal Medicine

## 2012-10-11 ENCOUNTER — Other Ambulatory Visit: Payer: Self-pay

## 2012-12-10 ENCOUNTER — Other Ambulatory Visit: Payer: Self-pay | Admitting: Internal Medicine

## 2012-12-10 NOTE — Telephone Encounter (Signed)
Rx request to pharmacy 30-day only; PATIENT DUE FOR FOLLOW-UP OFFICE VISIT/SLS   

## 2013-01-12 ENCOUNTER — Other Ambulatory Visit: Payer: Self-pay | Admitting: Family

## 2013-01-12 NOTE — Telephone Encounter (Signed)
Rx sent in to pharmacy. Called to advise patient to schedule ov appointment. Explained to patient that he will need to see a provider in order to receive more refills.

## 2013-02-17 ENCOUNTER — Encounter: Payer: Self-pay | Admitting: Family

## 2013-02-17 ENCOUNTER — Ambulatory Visit (INDEPENDENT_AMBULATORY_CARE_PROVIDER_SITE_OTHER): Payer: 59 | Admitting: Family

## 2013-02-17 VITALS — BP 118/72 | HR 63 | Temp 97.8°F | Resp 16 | Ht 68.0 in | Wt 180.1 lb

## 2013-02-17 DIAGNOSIS — E785 Hyperlipidemia, unspecified: Secondary | ICD-10-CM

## 2013-02-17 DIAGNOSIS — K219 Gastro-esophageal reflux disease without esophagitis: Secondary | ICD-10-CM

## 2013-02-17 DIAGNOSIS — I10 Essential (primary) hypertension: Secondary | ICD-10-CM

## 2013-02-17 DIAGNOSIS — F411 Generalized anxiety disorder: Secondary | ICD-10-CM

## 2013-02-17 DIAGNOSIS — R739 Hyperglycemia, unspecified: Secondary | ICD-10-CM

## 2013-02-17 DIAGNOSIS — F419 Anxiety disorder, unspecified: Secondary | ICD-10-CM

## 2013-02-17 DIAGNOSIS — R7309 Other abnormal glucose: Secondary | ICD-10-CM

## 2013-02-17 LAB — LIPID PANEL
Cholesterol: 184 mg/dL (ref 0–200)
HDL: 34 mg/dL — ABNORMAL LOW (ref >39)
Triglycerides: 276 mg/dL — ABNORMAL HIGH (ref <150)

## 2013-02-17 LAB — HEPATIC FUNCTION PANEL
Albumin: 4.4 g/dL (ref 3.5–5.2)
Alkaline Phosphatase: 49 U/L (ref 39–117)
Total Protein: 6.9 g/dL (ref 6.0–8.3)

## 2013-02-17 LAB — HEMOGLOBIN A1C: Hgb A1c MFr Bld: 6.1 % — ABNORMAL HIGH (ref <5.7)

## 2013-02-17 MED ORDER — HYDROCHLOROTHIAZIDE 12.5 MG PO CAPS: 12.5000 mg | ORAL_CAPSULE | ORAL | Status: DC

## 2013-02-17 MED ORDER — VALSARTAN 160 MG PO TABS: 160.0000 mg | ORAL_TABLET | Freq: Every day | ORAL | Status: DC

## 2013-02-17 MED ORDER — FENOFIBRATE MICRONIZED 130 MG PO CAPS: 130.0000 mg | ORAL_CAPSULE | Freq: Every day | ORAL | Status: DC

## 2013-02-17 NOTE — Assessment & Plan Note (Signed)
Mainly job related. Monitor off of meds.

## 2013-02-17 NOTE — Assessment & Plan Note (Addendum)
BP Readings from Last 3 Encounters:  02/17/13 118/72  03/13/12 116/72  01/08/12 114/67   BP remains stable on hctz and diovan. Obtain bmet.

## 2013-02-17 NOTE — Progress Notes (Signed)
  Subjective:    Patient ID: Shawn Perez, male    DOB: April 11, 1966, 47 y.o.   MRN: [id number]  HPI Shawn Perez is a 47 y.o. Male who presents to follow up and needs medicine reflils on Diovan, HCTZ, fenofibrate micronized.  HTN- States is well controled on Diovan.  GERD- States has improved and no longer needs medication (stopped Nexium a few months ago). No current symptoms. Anxiety- Complains of intermittent stress mostly job related. Ocassional panic attack but feels it is controled and does not want meds. Skin tags- new tags under both axilla over the past few months that bother him cosmetically. Not painful or itchy.  Glaucoma- lost vision in central field of Left eye 17 years ago. Takes Latanoprost to control pressure in right eye. No problems with right eye.   Review of Systems  Constitutional: Negative.   HENT: Negative.   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 Negative for pain, discharge, redness and itching.  Respiratory: Negative.   Cardiovascular: Negative for chest pain, palpitations and leg swelling.  Gastrointestinal: Negative.   Endocrine: Negative for polyuria.  Genitourinary: Negative for dysuria, frequency and difficulty urinating.  Neurological: Negative for dizziness, syncope, light-headedness, numbness and headaches.  Psychiatric/Behavioral: Negative.        Objective:   Physical Exam  Constitutional: He appears well-developed and well-nourished. No distress.  HENT:  Head: Normocephalic and atraumatic.  Eyes: Conjunctivae are normal. No scleral icterus.  Neck: Normal range of motion. Neck supple.  Skin: He is not diaphoretic.          Assessment & Plan:

## 2013-02-17 NOTE — Assessment & Plan Note (Signed)
Stable off of PPI  Monitor.  

## 2013-02-17 NOTE — Patient Instructions (Addendum)
Please complete your lab work prior to leaving. Please schedule a follow up appointment in 3 months.  

## 2013-02-17 NOTE — Progress Notes (Signed)
  Subjective:    Patient ID: Shawn Perez, male    DOB: Sep 13, 1965, 47 y.o.   MRN: [id number]  HPI    Review of Systems Past Medical History  Diagnosis Date  . Heart murmur     since birth  . Hypertension   . Hyperlipidemia 2011  . Sebaceous cyst     axillary    History   Social History  . Marital Status: Married    Spouse Name: N/A    Number of Children: N/A  . Years of Education: N/A   Occupational History  . Not on file.   Social History Main Topics  . Smoking status: Former Games developer  . Smokeless tobacco: Never Used     Comment: quit in 2000 smike 1 ppd  . Alcohol Use: Yes  . Drug Use: No  . Sexually Active: Not on file   Other Topics Concern  . Not on file   Social History Narrative  . No narrative on file    Past Surgical History  Procedure Laterality Date  . No past surgeries  07/09/2011    Denies surgical history    Family History  Problem Relation Age of Onset  . Hypertension Mother   . Diabetes Mother     Allergies  Allergen Reactions  . Penicillins Rash    Happened in childhood    Current Outpatient Prescriptions on File Prior to Visit  Medication Sig Dispense Refill  . latanoprost (XALATAN) 0.005 % ophthalmic solution        No current facility-administered medications on file prior to visit.    BP 118/72  Pulse 63  Temp(Src) 97.8 F (36.6 C) (Oral)  Resp 16  Ht 5\' 8"  (1.727 m)  Wt 180 lb 1.9 oz (81.702 kg)  BMI 27.39 kg/m2  SpO2 98%       Objective:   Physical Exam        Assessment & Plan:  I have personally interviewed and examined the pt and agree with Jewel Baize Paulus's PA student's assessment as above.

## 2013-02-18 ENCOUNTER — Other Ambulatory Visit: Payer: Self-pay | Admitting: Family

## 2013-02-18 LAB — BASIC METABOLIC PANEL WITH GFR
BUN: 27 mg/dL — ABNORMAL HIGH (ref 6–23)
Calcium: 9.8 mg/dL (ref 8.4–10.5)
Creat: 1.13 mg/dL (ref 0.50–1.35)
GFR, Est African American: 88 mL/min
GFR, Est Non African American: 76 mL/min

## 2013-02-18 MED ORDER — OMEGA-3 FATTY ACIDS 1000 MG PO CAPS: 2.0000 g | ORAL_CAPSULE | Freq: Two times a day (BID) | ORAL | Status: DC

## 2013-05-12 ENCOUNTER — Ambulatory Visit (INDEPENDENT_AMBULATORY_CARE_PROVIDER_SITE_OTHER): Payer: 59 | Admitting: Family

## 2013-05-12 ENCOUNTER — Encounter: Payer: Self-pay | Admitting: Family

## 2013-05-12 VITALS — BP 110/80 | HR 71 | Temp 98.1°F | Resp 16 | Wt 178.0 lb

## 2013-05-12 DIAGNOSIS — J029 Acute pharyngitis, unspecified: Secondary | ICD-10-CM

## 2013-05-12 DIAGNOSIS — J020 Streptococcal pharyngitis: Secondary | ICD-10-CM

## 2013-05-12 MED ORDER — CEFUROXIME AXETIL 500 MG PO TABS: 500.0000 mg | ORAL_TABLET | Freq: Two times a day (BID) | ORAL | Status: DC

## 2013-05-12 NOTE — Progress Notes (Signed)
Subjective:    Patient ID: Shawn Perez, male    DOB: Apr 29, 1966, 47 y.o.   MRN: [id number]  HPI  Shawn Perez is a 47 yr old male who presents today with chief complaint of sore throat. Symptoms have been present x 1 week.  Wife recently diagnosed with strep. Reports associated sinus drainage.  Denies associated fever.  Reports brief improvement with chloraseptic.   Review of Systems    see HPI  Past Medical History  Diagnosis Date  . Heart murmur     since birth  . Hypertension   . Hyperlipidemia 2011  . Sebaceous cyst     axillary    History   Social History  . Marital Status: Married    Spouse Name: N/A    Number of Children: N/A  . Years of Education: N/A   Occupational History  . Not on file.   Social History Main Topics  . Smoking status: Former Games developer  . Smokeless tobacco: Never Used     Comment: quit in 2000 smike 1 ppd  . Alcohol Use: Yes  . Drug Use: No  . Sexual Activity: Not on file   Other Topics Concern  . Not on file   Social History Narrative  . No narrative on file    Past Surgical History  Procedure Laterality Date  . No past surgeries  07/09/2011    Denies surgical history    Family History  Problem Relation Age of Onset  . Hypertension Mother   . Diabetes Mother     Allergies  Allergen Reactions  . Penicillins Rash    Happened in childhood    Current Outpatient Prescriptions on File Prior to Visit  Medication Sig Dispense Refill  . fenofibrate micronized (ANTARA) 130 MG capsule Take 1 capsule (130 mg total) by mouth daily before breakfast.  30 capsule  5  . fish oil-omega-3 fatty acids 1000 MG capsule Take 2 capsules (2 g total) by mouth 2 (two) times daily.      . hydrochlorothiazide (MICROZIDE) 12.5 MG capsule Take 1 capsule (12.5 mg total) by mouth every morning.  30 capsule  5  . latanoprost (XALATAN) 0.005 % ophthalmic solution       . valsartan (DIOVAN) 160 MG tablet Take 1 tablet (160 mg total) by mouth daily.  30  tablet  5   No current facility-administered medications on file prior to visit.    BP 110/80  Pulse 71  Temp(Src) 98.1 F (36.7 C) (Oral)  Resp 16  Wt 178 lb 0.6 oz (80.758 kg)  BMI 27.08 kg/m2  SpO2 99%    Objective:   Physical Exam  Constitutional: He is oriented to person, place, and time. He appears well-developed and well-nourished. No distress.  HENT:  Head: Normocephalic and atraumatic.  Right Ear: Tympanic membrane and ear canal normal.  Left Ear: Tympanic membrane and ear canal normal.  Mouth/Throat: No oropharyngeal exudate or posterior oropharyngeal edema.  Mild oropharyngeal erythema.  Cardiovascular: Normal rate and regular rhythm.   No murmur heard. Pulmonary/Chest: Effort normal and breath sounds normal. No respiratory distress. He has no wheezes. He has no rales. He exhibits no tenderness.  Musculoskeletal: He exhibits no edema.  Lymphadenopathy:    He has no cervical adenopathy.  Neurological: He is alert and oriented to person, place, and time.  Psychiatric: He has a normal mood and affect. His behavior is normal. Judgment and thought content normal.          Assessment &  Plan:

## 2013-05-12 NOTE — Patient Instructions (Addendum)
Strep Throat  Strep throat is an infection of the throat caused by a bacteria named Streptococcus pyogenes. Your caregiver may call the infection streptococcal "tonsillitis" or "pharyngitis" depending on whether there are signs of inflammation in the tonsils or back of the throat. Strep throat is most common in children aged 49 15 years during the cold months of the year, but it can occur in people of any age during any season. This infection is spread from person to person (contagious) through coughing, sneezing, or other close contact.  SYMPTOMS   · Fever or chills.  · Painful, swollen, red tonsils or throat.  · Pain or difficulty when swallowing.  · White or yellow spots on the tonsils or throat.  · Swollen, tender lymph nodes or "glands" of the neck or under the jaw.  · Red rash all over the body (rare).  DIAGNOSIS   Many different infections can cause the same symptoms. A test must be done to confirm the diagnosis so the right treatment can be given. A "rapid strep test" can help your caregiver make the diagnosis in a few minutes. If this test is not available, a light swab of the infected area can be used for a throat culture test. If a throat culture test is done, results are usually available in a day or two.  TREATMENT   Strep throat is treated with antibiotic medicine.  HOME CARE INSTRUCTIONS   · Gargle with 1 tsp of salt in 1 cup of warm water, 3 4 times per day or as needed for comfort.  · Family members who also have a sore throat or fever should be tested for strep throat and treated with antibiotics if they have the strep infection.  · Make sure everyone in your household washes their hands well.  · Do not share food, drinking cups, or personal items that could cause the infection to spread to others.  · You may need to eat a soft food diet until your sore throat gets better.  · Drink enough water and fluids to keep your urine clear or pale yellow. This will help prevent dehydration.  · Get plenty of  rest.  · Stay home from school, daycare, or work until you have been on antibiotics for 24 hours.  · Only take over-the-counter or prescription medicines for pain, discomfort, or fever as directed by your caregiver.  · If antibiotics are prescribed, take them as directed. Finish them even if you start to feel better.  SEEK MEDICAL CARE IF:   · The glands in your neck continue to enlarge.  · You develop a rash, cough, or earache.  · You cough up green, yellow-brown, or bloody sputum.  · You have pain or discomfort not controlled by medicines.  · Your problems seem to be getting worse rather than better.  SEEK IMMEDIATE MEDICAL CARE IF:   · You develop any new symptoms such as vomiting, severe headache, stiff or painful neck, chest pain, shortness of breath, or trouble swallowing.  · You develop severe throat pain, drooling, or changes in your voice.  · You develop swelling of the neck, or the skin on the neck becomes red and tender.  · You have a fever.  · You develop signs of dehydration, such as fatigue, dry mouth, and decreased urination.  · You become increasingly sleepy, or you cannot wake up completely.  Document Released: 08/10/2000 Document Revised: 07/30/2012 Document Reviewed: 10/12/2010  [employer]® Patient Information ©2014 [employer].

## 2013-05-12 NOTE — Assessment & Plan Note (Signed)
Rapid strep is +. Will rx with ceftin.

## 2013-05-18 ENCOUNTER — Ambulatory Visit (INDEPENDENT_AMBULATORY_CARE_PROVIDER_SITE_OTHER): Payer: 59 | Admitting: Family

## 2013-05-18 ENCOUNTER — Other Ambulatory Visit: Payer: Self-pay | Admitting: Family

## 2013-05-18 VITALS — BP 130/76 | HR 60 | Temp 97.6°F

## 2013-05-18 DIAGNOSIS — F419 Anxiety disorder, unspecified: Secondary | ICD-10-CM

## 2013-05-18 DIAGNOSIS — J020 Streptococcal pharyngitis: Secondary | ICD-10-CM

## 2013-05-18 DIAGNOSIS — R739 Hyperglycemia, unspecified: Secondary | ICD-10-CM

## 2013-05-18 DIAGNOSIS — E785 Hyperlipidemia, unspecified: Secondary | ICD-10-CM

## 2013-05-18 DIAGNOSIS — R7309 Other abnormal glucose: Secondary | ICD-10-CM

## 2013-05-18 DIAGNOSIS — I10 Essential (primary) hypertension: Secondary | ICD-10-CM

## 2013-05-18 DIAGNOSIS — F411 Generalized anxiety disorder: Secondary | ICD-10-CM

## 2013-05-18 LAB — BASIC METABOLIC PANEL
BUN: 21 mg/dL (ref 6–23)
CO2: 29 mEq/L (ref 19–32)
Calcium: 9.9 mg/dL (ref 8.4–10.5)
Creat: 0.99 mg/dL (ref 0.50–1.35)
Glucose, Bld: 118 mg/dL — ABNORMAL HIGH (ref 70–99)

## 2013-05-18 LAB — LIPID PANEL
HDL: 34 mg/dL — ABNORMAL LOW (ref >39)
LDL Cholesterol: 118 mg/dL — ABNORMAL HIGH (ref 0–99)
Triglycerides: 241 mg/dL — ABNORMAL HIGH (ref <150)

## 2013-05-18 NOTE — Progress Notes (Signed)
  Subjective:    Patient ID: Shawn Perez, male    DOB: [birth year], 47 y.o.   MRN: [id number]  HPI  Shawn Perez is a 47 yr old male who presents today for follow up of multiple medical problems:  1) Hyperlipidemia-  He continues fenofibrate and fish oil.  2) HTN- maintained on hctz and diovan. Reports tolerating medications.  3) Strep throat- reports clinically improved.  4) anxiety- reports that work is still stressful but that he is doing well.     Review of Systems See HPI  Denies polyuria.   Past Medical History  Diagnosis Date  . Heart murmur     since birth  . Hypertension   . Hyperlipidemia 2011  . Sebaceous cyst     axillary    History   Social History  . Marital Status: Married    Spouse Name: N/A    Number of Children: N/A  . Years of Education: N/A   Occupational History  . Not on file.   Social History Main Topics  . Smoking status: Former Games developer  . Smokeless tobacco: Never Used     Comment: quit in 2000 smike 1 ppd  . Alcohol Use: Yes  . Drug Use: No  . Sexual Activity: Not on file   Other Topics Concern  . Not on file   Social History Narrative  . No narrative on file    Past Surgical History  Procedure Laterality Date  . No past surgeries  07/09/2011    Denies surgical history    Family History  Problem Relation Age of Onset  . Hypertension Mother   . Diabetes Mother     Allergies  Allergen Reactions  . Penicillins Rash    Happened in childhood    Current Outpatient Prescriptions on File Prior to Visit  Medication Sig Dispense Refill  . cefUROXime (CEFTIN) 500 MG tablet Take 1 tablet (500 mg total) by mouth 2 (two) times daily.  20 tablet  0  . fenofibrate micronized (ANTARA) 130 MG capsule Take 1 capsule (130 mg total) by mouth daily before breakfast.  30 capsule  5  . fish oil-omega-3 fatty acids 1000 MG capsule Take 2 capsules (2 g total) by mouth 2 (two) times daily.      . hydrochlorothiazide (MICROZIDE) 12.5 MG  capsule Take 1 capsule (12.5 mg total) by mouth every morning.  30 capsule  5  . latanoprost (XALATAN) 0.005 % ophthalmic solution       . valsartan (DIOVAN) 160 MG tablet Take 1 tablet (160 mg total) by mouth daily.  30 tablet  5   No current facility-administered medications on file prior to visit.    BP 130/76  Pulse 60  Temp(Src) 97.6 F (36.4 C)  SpO2 98%       Objective:   Physical Exam  Constitutional: He is oriented to person, place, and time. He appears well-developed and well-nourished. No distress.  Cardiovascular: Normal rate and regular rhythm.   No murmur heard. Pulmonary/Chest: Effort normal and breath sounds normal. No respiratory distress. He has no wheezes. He has no rales. He exhibits no tenderness.  Musculoskeletal: He exhibits no edema.  Neurological: He is alert and oriented to person, place, and time.  Psychiatric: He has a normal mood and affect. His behavior is normal. Judgment and thought content normal.          Assessment & Plan:

## 2013-05-18 NOTE — Assessment & Plan Note (Signed)
Stable on current meds. Obtain BMET. 

## 2013-05-18 NOTE — Assessment & Plan Note (Signed)
Reports well controlled off meds.

## 2013-05-18 NOTE — Assessment & Plan Note (Signed)
Clinically stable obtain A1C.  

## 2013-05-18 NOTE — Assessment & Plan Note (Signed)
Obtain lipid panel. He did have some cream in his coffee this AM. Declines flu shot.

## 2013-05-18 NOTE — Assessment & Plan Note (Signed)
Resolved. Complete ceftin.

## 2013-05-19 ENCOUNTER — Other Ambulatory Visit: Payer: Self-pay | Admitting: Family

## 2013-05-19 DIAGNOSIS — E785 Hyperlipidemia, unspecified: Secondary | ICD-10-CM

## 2013-05-19 LAB — HEMOGLOBIN A1C: Mean Plasma Glucose: 148 mg/dL — ABNORMAL HIGH (ref <117)

## 2013-05-19 NOTE — Telephone Encounter (Signed)
LDL and trigs above goal.  I would recommend low dose statin (pended below) along with fenofibrate and fish oil.   Repeat lft and flp in 6 weeks fasting (dx hyperlipidemia). Call if unusual muscle pain develops while on statin.  Diabetes remains at goal but worse from last visit. A1C increased from 6.1 to 6.8.

## 2013-05-20 MED ORDER — ATORVASTATIN CALCIUM 10 MG PO TABS: 10.0000 mg | ORAL_TABLET | Freq: Every day | ORAL | Status: DC

## 2013-05-20 NOTE — Telephone Encounter (Signed)
Notified pt and he voices understanding. Rx sent to pharmacy. Lab order placed.

## 2013-07-02 ENCOUNTER — Other Ambulatory Visit: Payer: Self-pay

## 2013-07-17 ENCOUNTER — Telehealth: Payer: Self-pay | Admitting: Family

## 2013-07-17 NOTE — Telephone Encounter (Signed)
Patient is requesting to transfer care to [facility].

## 2013-07-17 NOTE — Telephone Encounter (Signed)
OK with me.

## 2013-07-20 ENCOUNTER — Ambulatory Visit (INDEPENDENT_AMBULATORY_CARE_PROVIDER_SITE_OTHER): Payer: 59 | Admitting: Nurse Practitioner

## 2013-07-20 ENCOUNTER — Encounter: Payer: Self-pay | Admitting: Nurse Practitioner

## 2013-07-20 VITALS — BP 108/58 | HR 85 | Temp 97.6°F | Ht 68.0 in | Wt 177.2 lb

## 2013-07-20 DIAGNOSIS — R7309 Other abnormal glucose: Secondary | ICD-10-CM

## 2013-07-20 DIAGNOSIS — I10 Essential (primary) hypertension: Secondary | ICD-10-CM

## 2013-07-20 DIAGNOSIS — R7989 Other specified abnormal findings of blood chemistry: Secondary | ICD-10-CM

## 2013-07-20 DIAGNOSIS — R079 Chest pain, unspecified: Secondary | ICD-10-CM

## 2013-07-20 DIAGNOSIS — E785 Hyperlipidemia, unspecified: Secondary | ICD-10-CM

## 2013-07-20 DIAGNOSIS — F1010 Alcohol abuse, uncomplicated: Secondary | ICD-10-CM

## 2013-07-20 MED ORDER — ESOMEPRAZOLE MAGNESIUM 40 MG PO CPDR: 40.0000 mg | DELAYED_RELEASE_CAPSULE | Freq: Every day | ORAL | Status: DC

## 2013-07-20 NOTE — Patient Instructions (Addendum)
Get labs done in 4 weeks. Our office will call you with results. In the meantime, limit alcohol intake to no more than 2 beers daily or 10 oz wine. Continue to abstain from sweet foods and beverages (EXCEPT FRUIT). Exercise 30 minutes daily-take a walk or whatever you like to do to help with carbohydrate metabolism. Contact [facility] for the pre-diabetes program [phone]. The director of the program is Doran Durand.  Take 40 mg nexium daily for the next 4 weeks. The episodes you have had may be related to ulcer disease. Other possibilities are gallbladder, possibly cardiac. Your stress test 5/13 was normal and your ECG today is normal.  Continue to take fenofibrate and 1000 to 2000 mg fish oil daily. We will re-evaluate medicines after your labs are done.  Great to meet you!

## 2013-07-20 NOTE — Progress Notes (Signed)
Pre-visit discussion using our clinic review tool. No additional management support is needed unless otherwise documented below in the visit note.  

## 2013-07-21 ENCOUNTER — Other Ambulatory Visit (HOSPITAL_BASED_OUTPATIENT_CLINIC_OR_DEPARTMENT_OTHER): Payer: 59

## 2013-07-21 ENCOUNTER — Ambulatory Visit (HOSPITAL_BASED_OUTPATIENT_CLINIC_OR_DEPARTMENT_OTHER)
Admission: RE | Admit: 2013-07-21 | Discharge: 2013-07-21 | Disposition: A | Discharge status: Home / Self Care | Payer: 59 | Source: Ambulatory Visit | Attending: Nurse Practitioner | Admitting: Nurse Practitioner

## 2013-07-21 DIAGNOSIS — R079 Chest pain, unspecified: Secondary | ICD-10-CM

## 2013-07-21 DIAGNOSIS — R935 Abnormal findings on diagnostic imaging of other abdominal regions, including retroperitoneum: Secondary | ICD-10-CM | POA: Insufficient documentation

## 2013-07-21 DIAGNOSIS — R0789 Other chest pain: Secondary | ICD-10-CM | POA: Insufficient documentation

## 2013-07-21 DIAGNOSIS — I10 Essential (primary) hypertension: Secondary | ICD-10-CM | POA: Insufficient documentation

## 2013-07-22 DIAGNOSIS — E785 Hyperlipidemia, unspecified: Secondary | ICD-10-CM | POA: Insufficient documentation

## 2013-07-22 NOTE — Progress Notes (Signed)
Subjective:    Patient ID: Shawn Perez, male    DOB: 1965-09-21, 47 y.o.   MRN: [id number]  HPI Comments: Accompanied by wife, Shawn Perez.  Gastrophageal Reflux He complains of chest pain (raditates to back between shoulder blades, causes bilat arm numbness & flush of heat & diaphoresis to head & face) and heartburn. He reports no abdominal pain, no coughing, no nausea, no sore throat, no water brash or no wheezing. about 5 episodes, lasting several minutes. This is a new (gerd is longstanding, cp episodes w/radiation to back are new) problem. The current episode started 1 to 4 weeks ago (3 episodes over last 5d). The problem occurs occasionally. The problem has been unchanged. The heartburn duration is several minutes. The heartburn is located in the substernum. The heartburn is of moderate intensity. The heartburn does not wake him from sleep. The heartburn limits his activity. The heartburn doesn't change with position. Nothing (episodes have occured while at rest & with activity-twice while drinking coffee) aggravates the symptoms. Pertinent negatives include no fatigue. Risk factors include caffeine use, ETOH use and lack of exercise (former smoker). He has tried a PPI and ETOH reduction (recently stopped taking esomeprazole 20 mg qd. Recent reduction of alcohol-less than 2 beers daily rather than 6.) for the symptoms. The treatment provided no relief.  Rest stress myoview 5/13: Normal stress nuclear study. Multiple ECG-all nml..      Review of Systems  Constitutional: Positive for diaphoresis and activity change (drinking less ETOH since episodes occuring). Negative for fever, chills, appetite change, fatigue and unexpected weight change.  HENT: Negative for congestion, sore throat, trouble swallowing and voice change.   Respiratory: Negative for cough, chest tightness, shortness of breath and wheezing.   Cardiovascular: Positive for chest pain (raditates to back between shoulder blades, causes  bilat arm numbness & flush of heat & diaphoresis to head & face). Negative for palpitations and leg swelling.  Gastrointestinal: Positive for heartburn and abdominal distention (epigastric). Negative for nausea, vomiting, abdominal pain and diarrhea.  Musculoskeletal: Positive for back pain (pain radiates to back between shoulders).  Skin: Positive for color change (gets pale with episodes of pain & sweating).  Neurological: Positive for numbness (bilat arm numbness). Negative for dizziness, light-headedness and headaches.  Hematological: Negative for adenopathy.       Objective:   Physical Exam  Vitals reviewed. Constitutional: He is oriented to person, place, and time. He appears well-developed and well-nourished. No distress.  HENT:  Head: Normocephalic and atraumatic.  Eyes: Conjunctivae are normal. Right eye exhibits no discharge. Left eye exhibits no discharge.  Neck: No tracheal deviation present. No thyromegaly present.  Cardiovascular: Normal rate, regular rhythm and normal heart sounds.   No murmur heard. Pulmonary/Chest: Effort normal and breath sounds normal. No respiratory distress. He has no wheezes.  Abdominal: Soft. Bowel sounds are normal. He exhibits no distension and no mass. There is no tenderness. There is no rebound and no guarding.  Musculoskeletal: He exhibits no edema (no LE extremity edema).  Neurological: He is alert and oriented to person, place, and time.  Skin: Skin is warm and dry.  Psychiatric: He has a normal mood and affect. His behavior is normal. Thought content normal.          Assessment & Plan:  1. Chest pain radiating to back between shoulder blades, accompanied by bilat arm numbness, nausea, diaphoresis, pallor Cardiac Risk factors: Strong fam Hx CVD-mother & brother, dyslipidemia: elevated triglycerides & LDL, low HDL. 10 yr  risk ASCVD is 4.12 %. Advised against statin for now. GI Risk factors: excessive ETOH, former smoker, overweight DD:  cardiogenic, PUD, GERD, alcohol induced gastritis, GB disease - EKG 12-Lead-NSR - US Abdomen Complete- no GB disease, slightly thickened liver wall c/w fatty liver, o/w nml scan - esomeprazole (NEXIUM) 40 MG capsule; Take 1 capsule (40 mg total) by mouth daily at 12 noon.  Dispense: 30 capsule; Refill: 2  2. Elevated hemoglobin A1c 6.8 from 6.1. Advise abstain or limit ETOH to 2 beers daily. Discussed high fiber carbs. Recommend pre-diabetes program at [facility]. Contact info given.   3 Hypertension. Well controlled. Taking HCTZ & Diovan. Pt Wants to stop HCTZ. Advise to check BP at home, if above 140/90, resume med. Instructed on BP monitoring.   4 dyslipidemia:Tot 200/tri 241/LDL 118/HDL 34. 10 yr risk ASCVD is 4.12 %. Advised against statin for now. Pt did not start lipitor-hesitant as his mother could not tolerate. Continue fenofibrate & 2000 mg omega 3. F/u 4 weeks or sooner if episodes do not improve w/nexium & ETOH reduction.

## 2013-07-27 ENCOUNTER — Telehealth: Payer: Self-pay | Admitting: Nurse Practitioner

## 2013-07-27 NOTE — Telephone Encounter (Signed)
Abd US shows no GB disease. CP likely r/t PUD. Pt had nml cardiac work up last year & nml ECG in ofc. Should continue esomeprazole & ETOH abstainence.

## 2013-07-30 ENCOUNTER — Other Ambulatory Visit: Payer: Self-pay | Admitting: Nurse Practitioner

## 2013-07-30 ENCOUNTER — Telehealth: Payer: Self-pay | Admitting: *Deleted

## 2013-07-30 DIAGNOSIS — K219 Gastro-esophageal reflux disease without esophagitis: Secondary | ICD-10-CM

## 2013-07-30 MED ORDER — OMEPRAZOLE 40 MG PO CPDR: 40.0000 mg | DELAYED_RELEASE_CAPSULE | Freq: Every day | ORAL | Status: DC

## 2013-07-30 NOTE — Progress Notes (Signed)
Patient notified of rx change. 

## 2013-07-30 NOTE — Telephone Encounter (Signed)
Patients Nexium prior authorization was denied. Patient must have tried or been unable to take omeprazole or pantoprazole to be approved of Nexium. Please advise?

## 2013-08-18 ENCOUNTER — Ambulatory Visit (INDEPENDENT_AMBULATORY_CARE_PROVIDER_SITE_OTHER): Payer: 59 | Admitting: Nurse Practitioner

## 2013-08-18 ENCOUNTER — Telehealth: Payer: Self-pay | Admitting: Nurse Practitioner

## 2013-08-18 ENCOUNTER — Encounter: Payer: Self-pay | Admitting: Nurse Practitioner

## 2013-08-18 VITALS — BP 120/70 | HR 67 | Temp 98.0°F | Ht 68.0 in | Wt 176.2 lb

## 2013-08-18 DIAGNOSIS — F411 Generalized anxiety disorder: Secondary | ICD-10-CM

## 2013-08-18 DIAGNOSIS — M26609 Unspecified temporomandibular joint disorder, unspecified side: Secondary | ICD-10-CM

## 2013-08-18 DIAGNOSIS — I10 Essential (primary) hypertension: Secondary | ICD-10-CM

## 2013-08-18 DIAGNOSIS — K219 Gastro-esophageal reflux disease without esophagitis: Secondary | ICD-10-CM

## 2013-08-18 MED ORDER — VALSARTAN 80 MG PO TABS: 80.0000 mg | ORAL_TABLET | Freq: Every day | ORAL | Status: DC

## 2013-08-18 MED ORDER — FLUOXETINE HCL 20 MG PO TABS: 20.0000 mg | ORAL_TABLET | Freq: Every day | ORAL | Status: DC

## 2013-08-18 MED ORDER — FLUOXETINE HCL 10 MG PO CAPS: 10.0000 mg | ORAL_CAPSULE | Freq: Every day | ORAL | Status: DC

## 2013-08-18 NOTE — Progress Notes (Signed)
Pre-visit discussion using our clinic review tool. No additional management support is needed unless otherwise documented below in the visit note.  

## 2013-08-18 NOTE — Telephone Encounter (Signed)
LMOVM for patient to return call concerning Bp medication changes.

## 2013-08-18 NOTE — Telephone Encounter (Signed)
Decrease losartan to 80 mg from 160 mg.

## 2013-08-18 NOTE — Patient Instructions (Signed)
Continue prilosec 40 mg for 4 more weeks, then we will consider cutting back to 20 mg. Continue to limit alcohol. The stool test for H. Pylori (causes gastritis) works best when you are not taking prilosec. Let's hold off until next visit. Start prozac. You will increase dose in 1 week. This may help TMJ. Let's re-evaluate in 6 weeks. Exercise goal: walk 30 minutes most days of the week. This helps with stress and lowers your risk for many diseases. Merry Christmas!  Temporomandibular Problems  Temporomandibular joint (TMJ) dysfunction means there are problems with the joint between your jaw and your skull. This is a joint lined by cartilage like other joints in your body but also has a small disc in the joint which keeps the bones from rubbing on each other. These joints are like other joints and can get inflamed (sore) from arthritis and other problems. When this joint gets sore, it can cause headaches and pain in the jaw and the face. CAUSES  Usually the arthritic types of problems are caused by soreness in the joint. Soreness in the joint can also be caused by overuse. This may come from grinding your teeth. It may also come from mis-alignment in the joint. DIAGNOSIS Diagnosis of this condition can often be made by history and exam. Sometimes your caregiver may need X-rays or an MRI scan to determine the exact cause. It may be necessary to see your dentist to determine if your teeth and jaws are lined up correctly. TREATMENT  Most of the time this problem is not serious; however, sometimes it can persist (become chronic). When this happens medications that will cut down on inflammation (soreness) help. Sometimes a shot of cortisone into the joint will be helpful. If your teeth are not aligned it may help for your dentist to make a splint for your mouth that can help this problem. If no physical problems can be found, the problem may come from tension. If tension is found to be the cause, biofeedback  or relaxation techniques may be helpful. HOME CARE INSTRUCTIONS   Later in the day, applications of ice packs may be helpful. Ice can be used in a plastic bag with a towel around it to prevent frostbite to skin. This may be used about every 2 hours for 20 to 30 minutes, as needed while awake, or as directed by your caregiver.  Only take over-the-counter or prescription medicines for pain, discomfort, or fever as directed by your caregiver.  If physical therapy was prescribed, follow your caregiver's directions.  Wear mouth appliances as directed if they were given. Document Released: 05/08/2001 Document Revised: 11/05/2011 Document Reviewed: 08/15/2008 [facility] Patient Information 2014 [place], [place].

## 2013-08-18 NOTE — Progress Notes (Signed)
Subjective:     Shawn Perez is a 47 y.o. male and is here for f/u of reflux. In addition, he mentions that he grinds his teeth at night & day causing considerable pain, and often wakes at night, having trouble falling back to sleep.  Regarding reflux, he is feeling better since increasing PPI to 40 mg daily and cutting back alcohol. He had 1 more episode of epigastric discomfort since ;last appointment. GB studies were normal. Regarding TMJ-he was diagnosed in the past, used OTC mouth guard without relief. He finds himself grinding teeth during the day-gets his attention when his jaw starts hurting. He is not taking anything for pain. In addition, he describes often feeling fidgety and has trouble staying asleep, although, he is sleeping better since he cut back ETOH. He states he has had no ETOH in 10 days.   History   Social History  . Marital Status: Married    Spouse Name: Larita Fife    Number of Children: N/A  . Years of Education: N/A   Occupational History  .     Social History Main Topics  . Smoking status: Former Games developer  . Smokeless tobacco: Never Used     Comment: quit in 2000 smike 1 ppd  . Alcohol Use: Yes  . Drug Use: No  . Sexual Activity: Not on file   Other Topics Concern  . Not on file   Social History Narrative  . No narrative on file   Health Maintenance  Topic Date Due  . Influenza Vaccine  07/20/2014  . Tetanus/tdap  09/28/2015    The following portions of the patient's history were reviewed and updated as appropriate: allergies, current medications, past medical history, past surgical history and problem list.  Review of Systems Constitutional: negative for chills, fatigue, fevers and night sweats Cardiovascular: negative for chest pain, chest pressure/discomfort, irregular heart beat, lower extremity edema and near-syncope Gastrointestinal: positive for epigastric pain times 1 since last visit. overall reflux symptoms better with prilosec & less ETOH.,  negative for change in bowel habits, nausea and vomiting Musculoskeletal:positive for jaw pain, grindoing teeth during night & day Behavioral/Psych: positive for anxiety and describes feeling fidgety & grinding teeth for many years, negative for decreased appetite, depression, illegal drug usage, tobacco use and has cut back on ETOH   Objective:    BP 120/70  Pulse 67  Temp(Src) 98 F (36.7 C) (Oral)  Ht 5\' 8"  (1.727 m)  Wt 176 lb 4 oz (79.946 kg)  BMI 26.80 kg/m2  SpO2 97% General appearance: alert, cooperative, appears stated age and no distress Head: Normocephalic, without obvious abnormality, atraumatic Eyes: negative findings: lids and lashes normal and conjunctivae and sclerae normal    Assessment:    1 HTN, well controlled. Pt states home readings are usually 110/60. Taking valsartan 160 mg & 12.5 HCTZ. 2 GERD, hx epigastric pain episodes assoc w/ nausea. NML cardiac & GB studies. Improvement since increased prilosec & cut back ETOH. 3 TMJ/ jaw pain. Grinds teeth day & night. Mouth guard not helpful. .    4 anxiety -historical & currently reports feeling fidgety, trouble staying asleep, grinds teeth. Plan:    1 Decrease valsartan from 160 mg to 80 mg. Continue exercise. 2 Continue prilosec 40 mg. H pylori stool test in 6 wks-will stop prilosec for 2 weeks prior. Cont to limit ETOH. 3 & 4 start prozac low dose. NSAIDS sparingly. Will refer to ENT or oral surgeon if prozac not helpful.  See  After Visit Summary for Counseling Recommendations

## 2013-08-19 NOTE — Telephone Encounter (Signed)
Patient returned call and was given info

## 2013-08-19 NOTE — Telephone Encounter (Signed)
LMOVM, 2nd message.

## 2013-08-24 ENCOUNTER — Other Ambulatory Visit: Payer: Self-pay | Admitting: Family

## 2013-08-24 ENCOUNTER — Other Ambulatory Visit: Payer: Self-pay | Admitting: Nurse Practitioner

## 2013-08-24 DIAGNOSIS — E785 Hyperlipidemia, unspecified: Secondary | ICD-10-CM

## 2013-08-24 NOTE — Telephone Encounter (Signed)
Patient advised that he needs to get his labs done. Patient stated that he would have labs done.

## 2013-08-24 NOTE — Telephone Encounter (Signed)
Please call pt ask him to get labs done at [facility] in [place]. I don't know if this med is working because he hasn't gotten labs done.

## 2013-08-25 ENCOUNTER — Other Ambulatory Visit: Payer: Self-pay | Admitting: Nurse Practitioner

## 2013-08-25 ENCOUNTER — Other Ambulatory Visit: Payer: Self-pay | Admitting: *Deleted

## 2013-08-25 DIAGNOSIS — D582 Other hemoglobinopathies: Secondary | ICD-10-CM

## 2013-08-25 DIAGNOSIS — I10 Essential (primary) hypertension: Secondary | ICD-10-CM

## 2013-08-25 DIAGNOSIS — F1010 Alcohol abuse, uncomplicated: Secondary | ICD-10-CM

## 2013-08-25 DIAGNOSIS — E785 Hyperlipidemia, unspecified: Secondary | ICD-10-CM

## 2013-08-25 LAB — HEMOGLOBIN A1C
Hgb A1c MFr Bld: 6.8 % — ABNORMAL HIGH (ref <5.7)
Mean Plasma Glucose: 148 mg/dL — ABNORMAL HIGH (ref <117)

## 2013-08-25 LAB — LIPID PANEL
Cholesterol: 178 mg/dL (ref 0–200)
HDL: 32 mg/dL — ABNORMAL LOW (ref >39)
Total CHOL/HDL Ratio: 5.6 Ratio
VLDL: 47 mg/dL — ABNORMAL HIGH (ref 0–40)

## 2013-08-25 LAB — CBC
HCT: 42.7 % (ref 39.0–52.0)
Hemoglobin: 14.6 g/dL (ref 13.0–17.0)
MCH: 30.5 pg (ref 26.0–34.0)
Platelets: 456 10*3/uL — ABNORMAL HIGH (ref 150–400)
RBC: 4.78 MIL/uL (ref 4.22–5.81)

## 2013-08-26 ENCOUNTER — Telehealth: Payer: Self-pay | Admitting: Nurse Practitioner

## 2013-08-26 ENCOUNTER — Other Ambulatory Visit: Payer: Self-pay | Admitting: Nurse Practitioner

## 2013-08-26 DIAGNOSIS — R7309 Other abnormal glucose: Secondary | ICD-10-CM

## 2013-08-26 DIAGNOSIS — E1169 Type 2 diabetes mellitus with other specified complication: Secondary | ICD-10-CM

## 2013-08-26 DIAGNOSIS — I10 Essential (primary) hypertension: Secondary | ICD-10-CM

## 2013-08-26 LAB — COMPREHENSIVE METABOLIC PANEL

## 2013-08-26 LAB — GLUTAMIC ACID DECARBOXYLASE AUTO ABS

## 2013-08-26 MED ORDER — ROSUVASTATIN CALCIUM 10 MG PO TABS: 10.0000 mg | ORAL_TABLET | Freq: Every day | ORAL | Status: DC

## 2013-08-26 MED ORDER — VALSARTAN 80 MG PO TABS: 80.0000 mg | ORAL_TABLET | Freq: Every day | ORAL | Status: DC

## 2013-08-26 NOTE — Telephone Encounter (Signed)
Spoke w/pharmacy. [facility] ins co wants PA for brand name. Pt wants brand name. Will not be able to fill out pa until next week. Spoke w/pt : he is willing to get a few generic tabs until paperwork can be processed. Sent order.

## 2013-08-26 NOTE — Telephone Encounter (Signed)
Pt has newly diagnosed DM: last 2 HgbA1c are 6.8. Waiting on labs to see if pt has LADA. If so, will start an insulin scretagogue. If not will start metformin. Change chol med to crestor as better trial data with diabetes r/t dyslipidemia. Pt to call & schedule ov for next week to discuss Diabetes & treatment. Will refer for diabetes ed classes.

## 2013-08-26 NOTE — Telephone Encounter (Signed)
[facility] who stated that we will still need to do a PA for this medication. Pharmacy faxed over a PA form.

## 2013-08-27 ENCOUNTER — Other Ambulatory Visit: Payer: Self-pay | Admitting: Nurse Practitioner

## 2013-08-27 DIAGNOSIS — R7309 Other abnormal glucose: Secondary | ICD-10-CM

## 2013-09-01 ENCOUNTER — Ambulatory Visit (INDEPENDENT_AMBULATORY_CARE_PROVIDER_SITE_OTHER): Payer: 59 | Admitting: Nurse Practitioner

## 2013-09-01 ENCOUNTER — Encounter: Payer: Self-pay | Admitting: Nurse Practitioner

## 2013-09-01 VITALS — BP 102/50 | HR 69 | Temp 97.8°F | Ht 68.0 in | Wt 174.4 lb

## 2013-09-01 DIAGNOSIS — E785 Hyperlipidemia, unspecified: Secondary | ICD-10-CM

## 2013-09-01 DIAGNOSIS — I10 Essential (primary) hypertension: Secondary | ICD-10-CM

## 2013-09-01 DIAGNOSIS — E119 Type 2 diabetes mellitus without complications: Secondary | ICD-10-CM

## 2013-09-01 NOTE — Patient Instructions (Signed)
Our office will call you with lab results and info about new medicine for diabetes. In the meantime, continue to limit sugar & alcoholic beverages.  If your blood pressure is under 130/80 you do not need blood pressure medicine. Please monitor blood pressure at home. Do not take blood pressure medicine if your blood pressure is consistently low. You are 10 pounds lighter than you were 2 years ago-weight loss decreases blood pressure. Headaches may be medication related. Continue cholesterol medicine for a full 10-14 days to see if headache gets better-often side effects of medicines go away after 32 weeks. If headache is persistent, call our office. Please attend diabetes education classes.   Diabetes and Exercise Exercising regularly is important. It is not just about losing weight. It has many health benefits, such as:  Improving your overall fitness, flexibility, and endurance.  Increasing your bone density.  Helping with weight control.  Decreasing your body fat.  Increasing your muscle strength.  Reducing stress and tension.  Improving your overall health. People with diabetes who exercise gain additional benefits because exercise:  Reduces appetite.  Improves the body's use of blood sugar (glucose).  Helps lower or control blood glucose.  Decreases blood pressure.  Helps control blood lipids (such as cholesterol and triglycerides).  Improves the body's use of the hormone insulin by:  Increasing the body's insulin sensitivity.  Reducing the body's insulin needs.  Decreases the risk for heart disease because exercising:  Lowers cholesterol and triglycerides levels.  Increases the levels of good cholesterol (such as high-density lipoproteins [HDL]) in the body.  Lowers blood glucose levels. YOUR ACTIVITY PLAN  Choose an activity that you enjoy and set realistic goals. Your health care provider or diabetes educator can help you make an activity plan that works for  you. You can break activities into 2 or 3 sessions throughout the day. Doing so is as good as one long session. Exercise ideas include:  Taking the dog for a walk.  Taking the stairs instead of the elevator.  Dancing to your favorite song.  Doing your favorite exercise with a friend. RECOMMENDATIONS FOR EXERCISING WITH TYPE 1 OR TYPE 2 DIABETES   Check your blood glucose before exercising. If blood glucose levels are greater than 240 mg/dL, check for urine ketones. Do not exercise if ketones are present.  Avoid injecting insulin into areas of the body that are going to be exercised. For example, avoid injecting insulin into:  The arms when playing tennis.  The legs when jogging.  Keep a record of:  Food intake before and after you exercise.  Expected peak times of insulin action.  Blood glucose levels before and after you exercise.  The type and amount of exercise you have done.  Review your records with your health care provider. Your health care provider will help you to develop guidelines for adjusting food intake and insulin amounts before and after exercising.  If you take insulin or oral hypoglycemic agents, watch for signs and symptoms of hypoglycemia. They include:  Dizziness.  Shaking.  Sweating.  Chills.  Confusion.  Drink plenty of water while you exercise to prevent dehydration or heat stroke. Body water is lost during exercise and must be replaced.  Talk to your health care provider before starting an exercise program to make sure it is safe for you. Remember, almost any type of activity is better than none. Document Released: 11/03/2003 Document Revised: 04/15/2013 Document Reviewed: 01/20/2013 [facility] Patient Information 2014 [place].

## 2013-09-01 NOTE — Progress Notes (Signed)
Pre-visit discussion using our clinic review tool. No additional management support is needed unless otherwise documented below in the visit note.  

## 2013-09-02 ENCOUNTER — Telehealth: Payer: Self-pay

## 2013-09-02 DIAGNOSIS — H4010X0 Unspecified open-angle glaucoma, stage unspecified: Secondary | ICD-10-CM | POA: Insufficient documentation

## 2013-09-02 DIAGNOSIS — E119 Type 2 diabetes mellitus without complications: Secondary | ICD-10-CM | POA: Insufficient documentation

## 2013-09-02 LAB — COMPREHENSIVE METABOLIC PANEL
ALK PHOS: 62 U/L (ref 39–117)
ALT: 53 U/L (ref 0–53)
AST: 43 U/L — AB (ref 0–37)
Albumin: 4.8 g/dL (ref 3.5–5.2)
BUN: 20 mg/dL (ref 6–23)
CO2: 30 mEq/L (ref 19–32)
Calcium: 10 mg/dL (ref 8.4–10.5)
Chloride: 103 mEq/L (ref 96–112)
Creatinine, Ser: 1.1 mg/dL (ref 0.4–1.5)
GFR: 78.72 mL/min (ref >60.00)
Glucose, Bld: 123 mg/dL — ABNORMAL HIGH (ref 70–99)
Potassium: 4.2 mEq/L (ref 3.5–5.1)
Sodium: 140 mEq/L (ref 135–145)
Total Bilirubin: 0.7 mg/dL (ref 0.3–1.2)
Total Protein: 7.2 g/dL (ref 6.0–8.3)

## 2013-09-02 NOTE — Telephone Encounter (Signed)
Relevant patient education assigned to patient using Emmi. ° °

## 2013-09-02 NOTE — Progress Notes (Signed)
Subjective:    Shawn Perez is a 48 y.o. male who presents for an initial discussion of newly diagnosed diabetes mellitus. Pt is slightly over weight, BMI 26. I wonder if he has LADA (Latent autoimmune diabetes in adults). Anti-glutamic acid test is pending. Current symptoms/problems include hyperglycemia, nausea and feels jittery at times and have been stable. Symptoms have been present for several months. He has had 2 elevated HgbA1C results 3 mos apart: both 6.8. He is being treated for hyperlipidemia, hypertension, glaucoma, and reflux. Today I will discuss the disease state, diagnosis, treatment plan, how it affects his body, lifestyle changes.  The patient was initially diagnosed with Type 2 diabetes mellitus based on the following criteria:  2 separate HgbA1C tests, both 6.8; 4 fasting glucose readings: 213, 113, 130, 118.  Known diabetic complications: retinopathy Cardiovascular risk factors: diabetes mellitus, dyslipidemia, hypertension, male gender and smoking/ tobacco exposure Current diabetic medications include none.   Eye exam current (within one year): yes Weight trend: decreasing steadily Prior visit with dietician: no Current diet: in general, an "unhealthy" diet, does not eat a lot of sugar, has made recent changes-limiting alcohol intake. Current exercise: goes to gym few days week  Current monitoring regimen: none  Is He on ACE inhibitor or angiotensin II receptor blocker?  Yes   valsartan (Diovan)  The following portions of the patient's history were reviewed and updated as appropriate: allergies, current medications, past medical history, past social history, past surgical history and problem list.  Review of Systems Constitutional: negative for chills, fatigue, fevers and positive for intentional weight loss as he has made healthy diet changes. Eyes: positive for contacts/glasses and glaucoma Cardiovascular: positive for irregular heart beat Gastrointestinal:  positive for abdominal pain, dyspepsia, nausea and reflux symptoms Integument/breast: positive for history of axillary absess Neurological: positive for headaches and pt states HA went away once he was diagnosed w/HTN in his 20's. Recently, HA has returned w/med changes: decreased valsartan to 80 mg from 160 mg & d/c fenofibrate & started crestor. Behavioral/Psych: positive for anxiety, excessive alcohol consumption and remote Hx tobacco use-quit 15 ya., recently dramatically cut back on ETOH Endocrine: positive for diabetic symptoms including blurry vision and increased fatigue, negative for diabetic symptoms including polydipsia, polyphagia and polyuria    Objective:    BP 102/50  Pulse 69  Temp(Src) 97.8 F (36.6 C) (Oral)  Ht 5\' 8"  (1.727 m)  Wt 174 lb 6 oz (79.096 kg)  BMI 26.52 kg/m2  SpO2 96%  General:  alert, cooperative, appears stated age and no distress                                       Lab Review Glucose, Bld (mg/dL)  Date Value  08/25/2013 TEST NOT PERFORMED   05/18/2013 118*  02/17/2013 130*     CO2 (mEq/L)  Date Value  08/25/2013 TEST NOT PERFORMED   05/18/2013 29   02/17/2013 28      BUN (mg/dL)  Date Value  08/25/2013 TEST NOT PERFORMED   05/18/2013 21   02/17/2013 27*     Creat (mg/dL)  Date Value  08/25/2013 TEST NOT PERFORMED   05/18/2013 0.99   02/17/2013 1.13      Assessment:    Diabetes Mellitus , May have LADA, test pending  HTN-home readings 120-110/80. Low in ofc today in spite of decreasing valsartan by half. Hyperlipidemia d/c fenofibrate, started  10 mg crestor few days ago-pt having HA Anxiety-started prozac, Pt took for about 1 week, made feel drowsy & stopped.  Plan:    1.  Plan to start metformin once labs result.  Education: Reviewed 'ABCs' of diabetes management (respective goals in parentheses):  A1C (<7), blood pressure (<130/80), and cholesterol (LDL <100). Compliance at present is estimated to be excellent. Efforts  to improve compliance (if necessary) will be directed at dietary modifications: cut out sugar & alcohol, learn about quality carbs high in fiber, increased exercise and regular blood sugar monitoring: 2  times daily. 2. Adv to monitor BP at home. If pressure below 130/80, stop valsartan 3 Continue crestor for another week, if HA persists, stop med, if HA goes away, will restart fenofibrate 4 will plan on ativan PRN if pt desires, but will not another new med now.

## 2013-09-07 ENCOUNTER — Telehealth: Payer: Self-pay | Admitting: *Deleted

## 2013-09-07 DIAGNOSIS — E119 Type 2 diabetes mellitus without complications: Secondary | ICD-10-CM

## 2013-09-07 DIAGNOSIS — K219 Gastro-esophageal reflux disease without esophagitis: Secondary | ICD-10-CM

## 2013-09-07 LAB — GLUTAMIC ACID DECARBOXYLASE AUTO ABS: Glutamic Acid Decarb Ab: <1 U/mL (ref <=1.0)

## 2013-09-07 MED ORDER — OMEPRAZOLE 20 MG PO CPDR: 20.0000 mg | DELAYED_RELEASE_CAPSULE | Freq: Every day | ORAL | Status: DC

## 2013-09-07 MED ORDER — METFORMIN HCL 500 MG PO TABS: ORAL_TABLET | ORAL | Status: DC

## 2013-09-07 NOTE — Telephone Encounter (Signed)
Patient left vm requesting results for labs he had done on 09/01/13.

## 2013-09-07 NOTE — Telephone Encounter (Signed)
No glutamic acid antibodies, so no LADA. Will treat as Type 2 . Start metformin 500 mg qd. F/u 3 mos. AST slightly elevated, will monitor. Home bp ranging 120-108/50, no dizziness or lightheadedness on 80 mg valsartan. Headaches have resolved. Continue crestor. No more epigastric /CP episodes since starting omeprazole. Will continue at reduced dose 20 mg. F/u in 3 mos.

## 2013-09-08 NOTE — Telephone Encounter (Signed)
Called patient no answer. Will call again later. Need to schedule follow-up appointment in March for diabetes medication, labs & Bp.

## 2013-09-15 ENCOUNTER — Telehealth: Payer: Self-pay | Admitting: *Deleted

## 2013-09-15 NOTE — Telephone Encounter (Signed)
Patient wanted you to know that he has stopped taking his Crestor. Patient stated that he started having back pain shortly after starting Crestor, so yesterday patient decided to stop taking medication to see if there was a difference with his back pain. Patient stated that the back pain was less today, he wants to stay off Crestor for a while and see if it is the med causing the pain.

## 2013-09-15 NOTE — Telephone Encounter (Signed)
Patient informed of follow-up appointment.

## 2013-09-25 ENCOUNTER — Telehealth: Payer: Self-pay | Admitting: Nurse Practitioner

## 2013-09-25 NOTE — Telephone Encounter (Signed)
Relevant patient education mailed to patient.  

## 2013-10-05 ENCOUNTER — Ambulatory Visit (INDEPENDENT_AMBULATORY_CARE_PROVIDER_SITE_OTHER): Payer: 59 | Admitting: Nurse Practitioner

## 2013-10-05 ENCOUNTER — Telehealth: Payer: Self-pay | Admitting: Nurse Practitioner

## 2013-10-05 ENCOUNTER — Encounter: Payer: Self-pay | Admitting: Nurse Practitioner

## 2013-10-05 VITALS — BP 133/72 | HR 74 | Temp 97.7°F | Resp 18 | Ht 68.0 in | Wt 178.0 lb

## 2013-10-05 DIAGNOSIS — M549 Dorsalgia, unspecified: Secondary | ICD-10-CM

## 2013-10-05 MED ORDER — CYCLOBENZAPRINE HCL 10 MG PO TABS: ORAL_TABLET | ORAL | Status: DC

## 2013-10-05 NOTE — Telephone Encounter (Signed)
Patient Information:  Caller Name: Nicolae  Phone: [phone]  Patient: Shawn Perez, Shawn Perez  Gender: Male  DOB: [birth year]  Age: 48 Years  PCP: Nicky Pugh  Office Follow Up:  Does the office need to follow up with this patient?: No  Instructions For The Office: N/A   Symptoms  Reason For Call & Symptoms: Pt started Crestor about 07/2013 and now has stopped taking it as he thinks this is causing his back pain.    Now he is having back pain that started 08/2013 and pain is on his left flank area.  He is also having some numbness in his right leg.  Reviewed Health History In EMR: Yes  Reviewed Medications In EMR: Yes  Reviewed Allergies In EMR: Yes  Reviewed Surgeries / Procedures: Yes  Date of Onset of Symptoms: 08/10/2013  Treatments Tried: ice packs  Treatments Tried Worked: No  Guideline(s) Used:  Flank Pain  Disposition Per Guideline:   Go to ED Now (or to Office with PCP Approval)  Reason For Disposition Reached:   Weakness of a leg or foot (e.g., unable to bear weight, dragging foot)  Advice Given:  N/A  Patient Will Follow Care Advice:  YES  Appointment Scheduled:  10/05/2013 15:30:00 Appointment Scheduled Provider:  Nicky Pugh

## 2013-10-05 NOTE — Patient Instructions (Signed)
Do back exercises & stretches daily. Take 400 mg ibuprophen twice daily for 2-3 days then as needed for back pain. Caution: ibuprophen can flare gastritis. Stop taking if you experience stomach pain or heart burn. If no improvement, I recommend short course prednisone and physical therapy. Let us know if no improvement or if you develop worse pain, leg weakness, bowel or bladder incontinence. Our office will call with lab results.  Back Exercises Back exercises help treat and prevent back injuries. The goal of back exercises is to increase the strength of your abdominal and back muscles and the flexibility of your back. These exercises should be started when you no longer have back pain. Back exercises include:  Pelvic Tilt. Lie on your back with your knees bent. Tilt your pelvis until the lower part of your back is against the floor. Hold this position 5 to 10 sec and repeat 5 to 10 times.  Knee to Chest. Pull first 1 knee up against your chest and hold for 20 to 30 seconds, repeat this with the other knee, and then both knees. This may be done with the other leg straight or bent, whichever feels better.  Sit-Ups or Curl-Ups. Bend your knees 90 degrees. Start with tilting your pelvis, and do a partial, slow sit-up, lifting your trunk only 30 to 45 degrees off the floor. Take at least 2 to 3 seconds for each sit-up. Do not do sit-ups with your knees out straight. If partial sit-ups are difficult, simply do the above but with only tightening your abdominal muscles and holding it as directed.  Hip-Lift. Lie on your back with your knees flexed 90 degrees. Push down with your feet and shoulders as you raise your hips a couple inches off the floor; hold for 10 seconds, repeat 5 to 10 times.  Shoulder-Lifts. Lie face down with arms beside your body. Keep hips and torso pressed to floor as you slowly lift your head and shoulders off the floor. Do not overdo your exercises, especially in the beginning.  Exercises may cause you some mild back discomfort which lasts for a few minutes; however, if the pain is more severe, or lasts for more than 15 minutes, do not continue exercises until you see your caregiver. Improvement with exercise therapy for back problems is slow.  See your caregivers for assistance with developing a proper back exercise program. Document Released: 09/20/2004 Document Revised: 11/05/2011 Document Reviewed: 06/14/2011 [facility] Patient Information 2014 [place].

## 2013-10-05 NOTE — Progress Notes (Signed)
   Subjective:    Patient ID: Shawn Perez, male    DOB: Sep 12, 1965, 48 y.o.   MRN: [id number]  HPI Comments: Pain started when started crestor. Pt thought crestor was causing pain. He stopped med for few days, pain persisted. He restarted crestor. Does not exacerbate pain.  Back Pain This is a chronic problem. The current episode started 1 to 4 weeks ago (4 wks). The problem occurs daily. The problem is unchanged. The pain is present in the sacro-iliac. The quality of the pain is described as aching. The pain radiates to the left thigh. The pain is moderate. The pain is worse during the night. The symptoms are aggravated by bending, lying down and sitting. Associated symptoms include leg pain (L lateral thigh) and weakness. Pertinent negatives include no abdominal pain, bladder incontinence, bowel incontinence, chest pain, dysuria, fever, headaches, numbness, paresis, paresthesias, pelvic pain, perianal numbness, tingling or weight loss. Risk factors include lack of exercise and sedentary lifestyle. He has tried nothing for the symptoms.      Review of Systems  Constitutional: Negative for fever, chills, weight loss, activity change, appetite change and fatigue.  HENT: Negative for congestion.   Respiratory: Negative for cough.   Cardiovascular: Negative for chest pain.  Gastrointestinal: Negative for nausea, abdominal pain, diarrhea, rectal pain and bowel incontinence.  Genitourinary: Positive for flank pain. Negative for bladder incontinence, dysuria, frequency, hematuria, penile pain, testicular pain and pelvic pain.  Musculoskeletal: Positive for back pain.  Neurological: Positive for weakness. Negative for tingling, numbness, headaches and paresthesias.       Objective:   Physical Exam  Vitals reviewed. Constitutional: He is oriented to person, place, and time. He appears well-developed and well-nourished. No distress.  HENT:  Head: Normocephalic and atraumatic.  Eyes:  Conjunctivae are normal. Right eye exhibits no discharge. Left eye exhibits no discharge.  Cardiovascular: Normal rate.   Pulmonary/Chest: Effort normal.  Abdominal: Soft. Bowel sounds are normal. He exhibits no distension and no mass. There is no tenderness. There is no rebound and no guarding.  Musculoskeletal: He exhibits no tenderness (no CVA tenderness, no spinal tenderness, not able to reporoduce pain w/palpation, but pt c/o worse pain w/forward flexion. LROM w/forward flexion.).  No pain on sitting straight leg raise.  Neurological: He is alert and oriented to person, place, and time.  Strength equal LE.  Skin: Skin is warm and dry.  Psychiatric: He has a normal mood and affect. His behavior is normal. Thought content normal.          Assessment & Plan:  1. Back pain with radiation Likely MSK, but will r/o UTI. - cyclobenzaprine (FLEXERIL) 10 MG tablet; Take 1/2 to 1 T po BID PRN back pain.  Dispense: 30 tablet; Refill: 0 Ibuprophen PRN.  Back stretches daily.  - POCT Urinalysis, Dipstick; Standing - POCT Urinalysis, Dipstick - Urinalysis, Routine w reflex microscopic See pt instructions. - Urine culture

## 2013-10-06 ENCOUNTER — Ambulatory Visit: Payer: 59

## 2013-10-06 LAB — URINE CULTURE
Colony Count: NO GROWTH
Organism ID, Bacteria: NO GROWTH

## 2013-10-06 LAB — URINALYSIS, ROUTINE W REFLEX MICROSCOPIC
BILIRUBIN URINE: NEGATIVE
Hgb urine dipstick: NEGATIVE
Ketones, ur: NEGATIVE
Leukocytes, UA: NEGATIVE
Nitrite: NEGATIVE
PH: 7 (ref 5.0–8.0)
Specific Gravity, Urine: 1.01 (ref 1.000–1.030)
TOTAL PROTEIN, URINE-UPE24: NEGATIVE
URINE GLUCOSE: NEGATIVE
Urobilinogen, UA: 0.2 (ref 0.0–1.0)
WBC, UA: NONE SEEN — AB (ref 0–?)

## 2013-10-08 ENCOUNTER — Telehealth: Payer: Self-pay | Admitting: *Deleted

## 2013-10-08 ENCOUNTER — Other Ambulatory Visit: Payer: Self-pay | Admitting: Nurse Practitioner

## 2013-10-08 DIAGNOSIS — E785 Hyperlipidemia, unspecified: Secondary | ICD-10-CM

## 2013-10-08 MED ORDER — FENOFIBRATE MICRONIZED 130 MG PO CAPS: 130.0000 mg | ORAL_CAPSULE | Freq: Every day | ORAL | Status: DC

## 2013-10-08 NOTE — Telephone Encounter (Signed)
Patient notified

## 2013-10-08 NOTE — Telephone Encounter (Signed)
Message copied by Julieta Bellini on Thu Oct 08, 2013  1:32 PM ------      Message from: WEAVER, [place] [facility]      Created: Wed Oct 07, 2013 11:47 PM       pls call pt:      Back pain is not due to urinary infection. Probably musculoskeletal. ------

## 2013-10-08 NOTE — Telephone Encounter (Signed)
Patient took himself off Crestor. Pt would like to try fenofibrate for 30 days. Pt took this med in the past. He wants to see if it makes a difference with his back pain. Please advise?

## 2013-10-08 NOTE — Telephone Encounter (Signed)
Yes he can stop crestor. Re-start fenobibrate. I will order. PLs notify pt.

## 2013-10-13 ENCOUNTER — Ambulatory Visit: Payer: 59

## 2013-10-15 ENCOUNTER — Encounter: Payer: 59 | Attending: Nurse Practitioner

## 2013-10-15 VITALS — Ht 68.0 in | Wt 175.4 lb

## 2013-10-15 DIAGNOSIS — E119 Type 2 diabetes mellitus without complications: Secondary | ICD-10-CM | POA: Insufficient documentation

## 2013-10-16 NOTE — Progress Notes (Signed)
Patient was seen on 10/15/13 for the second of a series of three diabetes self-management courses at [facility]. The following learning objectives were met by the patient during this class:   Describe the role of different macronutrients on glucose  Explain how carbohydrates affect blood glucose  State what foods contain the most carbohydrates  Demonstrate carbohydrate counting  Demonstrate how to read Nutrition Facts food label  Describe effects of various fats on heart health  Describe the importance of good nutrition for health and healthy eating strategies  Describe techniques for managing your shopping, cooking and meal planning  List strategies to follow meal plan when dining out  Describe the effects of alcohol on glucose and how to use it safely  Goals:  Follow Diabetes Meal Plan as instructed  Eat 3 meals and 2 snacks, every 3-5 hrs  Limit carbohydrate intake to 45 grams carbohydrate/meal Limit carbohydrate intake to 15 grams carbohydrate/snack Add lean protein foods to meals/snacks  Monitor glucose levels as instructed by your doctor   Follow-Up Plan:  Attend Core 3 and core 1  Work towards following your personal food plan.

## 2013-10-20 DIAGNOSIS — E119 Type 2 diabetes mellitus without complications: Secondary | ICD-10-CM

## 2013-10-21 ENCOUNTER — Telehealth: Payer: Self-pay | Admitting: Nurse Practitioner

## 2013-10-21 NOTE — Telephone Encounter (Signed)
Pt has appointment for 11/06/2013.

## 2013-10-21 NOTE — Progress Notes (Signed)
Patient was seen on 10/20/13 for the third of a series of three diabetes self-management courses at the [facility]. The following learning objectives were met by the patient during this class:    State the amount of activity recommended for healthy living   Describe activities suitable for individual needs   Identify ways to regularly incorporate activity into daily life   Identify barriers to activity and ways to over come these barriers  Identify diabetes medications being personally used and their primary action for lowering glucose and possible side effects   Describe role of stress on blood glucose and develop strategies to address psychosocial issues   Identify diabetes complications and ways to prevent them  Explain how to manage diabetes during illness   Evaluate success in meeting personal goal   Establish 2-3 goals that they will plan to diligently work on until they return for the  17-monthfollow-up visit  Goals:  Follow Diabetes Meal Plan as instructed  Aim for 15-30 mins of physical activity daily as tolerated  Bring food record and glucose log to your follow up visit  Your patient will establish 4 month goals once he attends Core 1 which he plans to attend on 10/30/13:

## 2013-10-21 NOTE — Telephone Encounter (Signed)
Started metformin 09/07/13. Need to see pt in April to eval metformin: will check hgbA1C, med tolerance & thyroid function.

## 2013-10-30 ENCOUNTER — Encounter: Payer: 59 | Attending: Nurse Practitioner

## 2013-10-30 DIAGNOSIS — E119 Type 2 diabetes mellitus without complications: Secondary | ICD-10-CM | POA: Insufficient documentation

## 2013-11-06 ENCOUNTER — Ambulatory Visit (INDEPENDENT_AMBULATORY_CARE_PROVIDER_SITE_OTHER): Payer: 59 | Admitting: Nurse Practitioner

## 2013-11-06 ENCOUNTER — Encounter: Payer: Self-pay | Admitting: Nurse Practitioner

## 2013-11-06 ENCOUNTER — Ambulatory Visit (HOSPITAL_BASED_OUTPATIENT_CLINIC_OR_DEPARTMENT_OTHER)
Admission: RE | Admit: 2013-11-06 | Discharge: 2013-11-06 | Disposition: A | Discharge status: Home / Self Care | Payer: 59 | Source: Ambulatory Visit | Attending: Nurse Practitioner | Admitting: Nurse Practitioner

## 2013-11-06 VITALS — BP 120/74 | HR 66 | Temp 98.0°F | Ht 68.0 in | Wt 173.5 lb

## 2013-11-06 DIAGNOSIS — M5432 Sciatica, left side: Secondary | ICD-10-CM

## 2013-11-06 DIAGNOSIS — E785 Hyperlipidemia, unspecified: Secondary | ICD-10-CM

## 2013-11-06 DIAGNOSIS — M545 Low back pain, unspecified: Secondary | ICD-10-CM | POA: Insufficient documentation

## 2013-11-06 DIAGNOSIS — M5430 Sciatica, unspecified side: Secondary | ICD-10-CM

## 2013-11-06 DIAGNOSIS — E119 Type 2 diabetes mellitus without complications: Secondary | ICD-10-CM

## 2013-11-06 DIAGNOSIS — I10 Essential (primary) hypertension: Secondary | ICD-10-CM

## 2013-11-06 DIAGNOSIS — K219 Gastro-esophageal reflux disease without esophagitis: Secondary | ICD-10-CM

## 2013-11-06 LAB — HEMOGLOBIN A1C: HEMOGLOBIN A1C: 6.6 % — AB (ref 4.6–6.5)

## 2013-11-06 MED ORDER — METHYLPREDNISOLONE ACETATE 40 MG/ML IJ SUSP
40.0000 mg | Freq: Once | INTRAMUSCULAR | Status: AC
  Administered 2013-11-06: 40 mg via INTRAMUSCULAR

## 2013-11-06 MED ORDER — PREDNISONE 10 MG PO TABS: ORAL_TABLET | ORAL | Status: DC

## 2013-11-06 NOTE — Assessment & Plan Note (Addendum)
Pt reports pain worse w/standing. Lumbar & L buttock. Muscle relaxer & NSAID not helpful. No spinal tenderness. Limited forward flexion. No scoliosis or kyphosis or loss of lordosis. No CVA tenderness. Inj depomedrol today followed by prednisone taper. Xray lumbar spine. Next step PT if Xray indicates strain.

## 2013-11-06 NOTE — Patient Instructions (Signed)
I think your back pain is musculoskeletal. If steroids help pain, that will confirm that pain is musculoskeletal. Our office will let you know lab results. Continue with metformin & exercise & diet changes. Great job with weight loss! Keep up the good work!  Lumbosacral Strain Lumbosacral strain is a strain of any of the parts that make up your lumbosacral vertebrae. Your lumbosacral vertebrae are the bones that make up the lower third of your backbone. Your lumbosacral vertebrae are held together by muscles and tough, fibrous tissue (ligaments).  CAUSES  A sudden blow to your back can cause lumbosacral strain. Also, anything that causes an excessive stretch of the muscles in the low back can cause this strain. This is typically seen when people exert themselves strenuously, fall, lift heavy objects, bend, or crouch repeatedly. RISK FACTORS  Physically demanding work.  Participation in pushing or pulling sports or sports that require sudden twist of the back (tennis, golf, baseball).  Weight lifting.  Excessive lower back curvature.  Forward-tilted pelvis.  Weak back or abdominal muscles or both.  Tight hamstrings. SIGNS AND SYMPTOMS  Lumbosacral strain may cause pain in the area of your injury or pain that moves (radiates) down your leg.  DIAGNOSIS Your health care provider can often diagnose lumbosacral strain through a physical exam. In some cases, you may need tests such as X-ray exams.  TREATMENT  Treatment for your lower back injury depends on many factors that your clinician will have to evaluate. However, most treatment will include the use of anti-inflammatory medicines. HOME CARE INSTRUCTIONS   Avoid hard physical activities (tennis, racquetball, waterskiing) if you are not in proper physical condition for it. This may aggravate or create problems.  If you have a back problem, avoid sports requiring sudden body movements. Swimming and walking are generally safer  activities.  Maintain good posture.  Maintain a healthy weight.  For acute conditions, you may put ice on the injured area.  Put ice in a plastic bag.  Place a towel between your skin and the bag.  Leave the ice on for 20 minutes, 2 3 times a day.  When the low back starts healing, stretching and strengthening exercises may be recommended. SEEK MEDICAL CARE IF:  Your back pain is getting worse.  You experience severe back pain not relieved with medicines. SEEK IMMEDIATE MEDICAL CARE IF:   You have numbness, tingling, weakness, or problems with the use of your arms or legs.  There is a change in bowel or bladder control.  You have increasing pain in any area of the body, including your belly (abdomen).  You notice shortness of breath, dizziness, or feel faint.  You feel sick to your stomach (nauseous), are throwing up (vomiting), or become sweaty.  You notice discoloration of your toes or legs, or your feet get very cold. MAKE SURE YOU:   Understand these instructions.  Will watch your condition.  Will get help right away if you are not doing well or get worse. Document Released: 05/23/2005 Document Revised: 06/03/2013 Document Reviewed: 04/01/2013 [facility] Patient Information 2014 [place], [place].

## 2013-11-06 NOTE — Assessment & Plan Note (Signed)
Pt was instructed to check BP at home:If < 130/80, stop valsartan. All readings < 130/80. Pt stopped valsartan 9 da.  Well controlled in ofc.  He has made diet changes & is exercising 3 times weekly. Feels well. Newly diagnosed w/diabetes. Will monitor progress. Will consider ACEI if HgbA1C over 7.0.

## 2013-11-06 NOTE — Assessment & Plan Note (Signed)
Started metformin 10 wks ago.Pt tolerating well. Went to nutrition classes-pt states very helpful. Will check HgbA1C today. Continue metformin, diet changes, exercise. Will f/u in 3 mos.

## 2013-11-06 NOTE — Progress Notes (Signed)
Pre visit review using our clinic review tool, if applicable. No additional management support is needed unless otherwise documented below in the visit note. 

## 2013-11-06 NOTE — Progress Notes (Signed)
Subjective:     Shawn Perez is a 48 y.o. male. He returns for f/u of newly diagnosed DM, GERD, HTN, hyperlipidemia, back pain.   The following portions of the patient's history were reviewed and updated as appropriate: allergies, current medications, past medical history, past social history, past surgical history and problem list.  Review of Systems Constitutional: negative for chills, fatigue and fevers Respiratory: negative for cough and dyspnea on exertion Cardiovascular: negative for chest pain, irregular heart beat and lower extremity edema Genitourinary:positive for dysuria, negative for frequency, hematuria and hesitancy Musculoskeletal:positive for back pain Behavioral/Psych: pt has cut back ETOH intake Endocrine: negative for diabetic symptoms including polydipsia, polyphagia and polyuria    Objective:    BP 120/74  Pulse 66  Temp(Src) 98 F (36.7 C) (Oral)  Ht 5\' 8"  (1.727 m)  Wt 173 lb 8 oz (78.699 kg)  BMI 26.39 kg/m2  SpO2 98% BP 120/74  Pulse 66  Temp(Src) 98 F (36.7 C) (Oral)  Ht 5\' 8"  (1.727 m)  Wt 173 lb 8 oz (78.699 kg)  BMI 26.39 kg/m2  SpO2 98% General appearance: alert, cooperative, appears stated age and no distress Head: Normocephalic, without obvious abnormality, atraumatic Eyes: negative findings: lids and lashes normal, conjunctivae and sclerae normal and wearing glasses Back: no kyphosis present, no scoliosis present, no tenderness to percussion or palpation, limited forward flexion    Assessment:     1. Back pain with left-sided sciatica   2. Diabetes   3. HTN (hypertension)   4. GERD (gastroesophageal reflux disease)   5. Hyperlipidemia LDL goal <100         Plan:     See prob list for plans. See pt instructions.

## 2013-11-09 ENCOUNTER — Telehealth: Payer: Self-pay | Admitting: Nurse Practitioner

## 2013-11-09 NOTE — Telephone Encounter (Signed)
Patient notified of results.

## 2013-11-09 NOTE — Telephone Encounter (Signed)
Relevant patient education assigned to patient using Emmi. ° °

## 2013-11-09 NOTE — Telephone Encounter (Signed)
HgbA1C in pre-diabetes range. Pt should continue w/metformin, exercise, & diet changes. Will check again in 3 mos. Will consider stopping metformin if HgbA1C < 6.7. Alternatively, may continue as long as between 5.7- 6.6.

## 2013-11-13 ENCOUNTER — Telehealth: Payer: Self-pay | Admitting: *Deleted

## 2013-11-13 DIAGNOSIS — M545 Low back pain, unspecified: Secondary | ICD-10-CM

## 2013-11-13 DIAGNOSIS — N410 Acute prostatitis: Secondary | ICD-10-CM

## 2013-11-13 DIAGNOSIS — R1032 Left lower quadrant pain: Secondary | ICD-10-CM

## 2013-11-13 DIAGNOSIS — R1031 Right lower quadrant pain: Secondary | ICD-10-CM

## 2013-11-13 MED ORDER — OXYCODONE-ACETAMINOPHEN 5-300 MG PO TABS: 1.0000 | ORAL_TABLET | ORAL | Status: DC | PRN

## 2013-11-13 MED ORDER — CIPROFLOXACIN HCL 500 MG PO TABS: 500.0000 mg | ORAL_TABLET | Freq: Two times a day (BID) | ORAL | Status: DC

## 2013-11-13 NOTE — Telephone Encounter (Signed)
Wife called: states pt is having increased pain in back & groin, not sleeping due to pain. Muscle relaxer & prednisone have not helped.  Will start cipro. Pt will come in Monday- will check PSA & DRE. If tender, confirms prostatitis. Will repeat PSA. If no tenderness & no improvement, get imaging ( MRI lumbar & CT/abd pelvis).

## 2013-11-13 NOTE — Telephone Encounter (Signed)
Patient's wife Shawn Perez called and stated that pt is still in a lot of pain. The medication is not working for him. Pt is having pain in his groan area now and the pain is excruciating. Shawn Perez is requesting that Layne call her sometime today. I explained to Shawn Perez that it may be the end of the day before Layne will be able to talk with her. Shawn Perez said that that will be fine.

## 2013-11-16 ENCOUNTER — Ambulatory Visit (INDEPENDENT_AMBULATORY_CARE_PROVIDER_SITE_OTHER): Payer: 59 | Admitting: Nurse Practitioner

## 2013-11-16 ENCOUNTER — Encounter: Payer: Self-pay | Admitting: Nurse Practitioner

## 2013-11-16 VITALS — BP 133/76 | HR 69 | Temp 98.1°F | Ht 68.0 in | Wt 171.5 lb

## 2013-11-16 DIAGNOSIS — R1031 Right lower quadrant pain: Secondary | ICD-10-CM

## 2013-11-16 DIAGNOSIS — R109 Unspecified abdominal pain: Secondary | ICD-10-CM

## 2013-11-16 DIAGNOSIS — R1032 Left lower quadrant pain: Principal | ICD-10-CM

## 2013-11-16 NOTE — Patient Instructions (Signed)
I think groin pain is from a local infection called prostatitis. The antibiotic you are taking will treat the infection. I am not certain what is causing L sided back pain.  It may be related to the infection. The lab tests will help determine cause. Our office will call with lab results. PSA should be checked again in 6 weeks. If all pain is related to infection, all pain will be relieved with antibiotic treatment. Please call my office if you are still experiencing pain-you may need MRI of back. See you in 6 weeks.  Prostatitis The prostate gland is about the size and shape of a walnut. It is located just below your bladder. It produces one of the components of semen, which is made up of sperm and the fluids that help nourish and transport it out from the testicles. Prostatitis is inflammation of the prostate gland.  There are four types of prostatitis:  Acute bacterial prostatitis This is the least common type of prostatitis. It starts quickly and usually is associated with a bladder infection, high fever, and shaking chills. It can occur at any age.  Chronic bacterial prostatitis This is a persistent bacterial infection in the prostate. It usually develops from repeated acute bacterial prostatitis or acute bacterial prostatitis that was not properly treated. It can occur in men of any age but is most common in middle-aged men whose prostate has begun to enlarge. The symptoms are not as severe as those in acute bacterial prostatitis. Discomfort in the part of your body that is in front of your rectum and below your scrotum (perineum), lower abdomen, or in the head of your penis (glans) may represent your primary discomfort.  Chronic prostatitis (nonbacterial) This is the most common type of prostatitis. It is inflammation of the prostate gland that is not caused by a bacterial infection. The cause is unknown and may be associated with a viral infection or autoimmune disorder.  Prostatodynia (pelvic  floor disorder) This is associated with increased muscular tone in the pelvis surrounding the prostate. CAUSES The causes of bacterial prostatitis are bacterial infection. The causes of the other types of prostatitis are unknown.  SYMPTOMS  Symptoms can vary depending upon the type of prostatitis that exists. There can also be overlap in symptoms. Possible symptoms for each type of prostatitis are listed below. Acute Bacterial Prostatitis  Painful urination.  Fever or chills.  Muscle or joint pains.  Low back pain.  Low abdominal pain.  Inability to empty bladder completely. Chronic Bacterial Prostatitis, Chronic Nonbacterial Prostatitis, and Prostatodynia  Sudden urge to urinate.  Frequent urination.  Difficulty starting urine stream.  Weak urine stream.  Discharge from the urethra.  Dribbling after urination.  Rectal pain.  Pain in the testicles, penis, or tip of the penis.  Pain in the perineum.  Problems with sexual function.  Painful ejaculation.  Bloody semen. DIAGNOSIS  In order to diagnose prostatitis, your health care provider will ask about your symptoms. One or more urine samples will be taken and tested (urinalysis). If the urinalysis result is negative for bacteria, your health care provider may use a finger to feel your prostate (digital rectal exam). This exam helps your health care provider determine if your prostate is swollen and tender. It will also produce a specimen of semen that can be analyzed. TREATMENT  Treatment for prostatitis depends on the cause. If a bacterial infection is the cause, it can be treated with antibiotic medicine. In cases of chronic bacterial prostatitis, the  use of antibiotics for up to 1 month or 6 weeks may be necessary. Your health care provider may instruct you to take sitz baths to help relieve pain. A sitz bath is a bath of hot water in which your hips and buttocks are under water. This relaxes the pelvic floor muscles  and often helps to relieve the pressure on your prostate. HOME CARE INSTRUCTIONS   Take all medicines as directed by your health care provider.  Take sitz baths as directed by your health care provider. SEEK MEDICAL CARE IF:   Your symptoms get worse, not better.  You have a fever. SEEK IMMEDIATE MEDICAL CARE IF:   You have chills.  You feel nauseous or vomit.  You feel lightheaded or faint.  You are unable to urinate.  You have blood or blood clots in your urine. Document Released: 08/10/2000 Document Revised: 06/03/2013 Document Reviewed: 03/02/2013 [facility] Patient Information 2014 [place].

## 2013-11-16 NOTE — Progress Notes (Signed)
   Subjective:    Patient ID: Shawn Perez, male    DOB: 1965-09-19, 48 y.o.   MRN: [id number]  Back Pain This is a chronic problem. The current episode started 1 to 4 weeks ago (3-4 weeks). The problem occurs constantly. The problem is unchanged. The quality of the pain is described as aching. The pain does not radiate. The pain is moderate. The pain is the same all the time. The symptoms are aggravated by standing. Associated symptoms include pelvic pain (bilateral groin pain). Pertinent negatives include no abdominal pain, bladder incontinence, dysuria, fever, numbness, paresis, paresthesias, tingling or weakness. Risk factors: Hx of prostatitis. Treatments tried: no improvement with prednisone taper, muscle relaxer, cipro  The treatment provided significant (groin pain significantly improved on cipro. back pain persistent) relief.      Review of Systems  Constitutional: Negative for fever, chills, activity change, appetite change and fatigue.  Respiratory: Negative for cough and shortness of breath.   Gastrointestinal: Negative for nausea, vomiting, abdominal pain, diarrhea and constipation.  Genitourinary: Positive for flank pain (Left), testicular pain (bilateral) and pelvic pain (bilateral groin pain). Negative for bladder incontinence, dysuria, urgency, frequency, hematuria, decreased urine volume, scrotal swelling and penile pain.  Musculoskeletal: Positive for back pain (L sided thoraco- lumbar pain.). Negative for gait problem, myalgias and neck stiffness.  Neurological: Negative for tingling, weakness, numbness and paresthesias.       Objective:   Physical Exam  Vitals reviewed. Constitutional: He is oriented to person, place, and time. He appears well-developed and well-nourished. No distress.  HENT:  Head: Normocephalic and atraumatic.  Right Ear: External ear normal.  Left Ear: External ear normal.  Cardiovascular: Normal rate.   Pulmonary/Chest: Effort normal. No  respiratory distress.  Musculoskeletal: He exhibits tenderness (Mild L CVA tenderness ).  No point tenderness, ROM limited w/forward flexion. No scoliosis, kyphosis.  Neurological: He is alert and oriented to person, place, and time.  Skin: Skin is warm and dry.  Psychiatric: He has a normal mood and affect. His behavior is normal. Thought content normal.          Assessment & Plan:  1. Bilateral groin pain Likely prostatitis, pain improved in 48 hrs after starting cipro DD: orchitis, epididymitis - PSA, repeat in 6 weeks - Urine culture - POCT Urinalysis, Dipstick Continue cipro  2. Left flank pain Persistant, not relieved by prednisone, nml lumbar xray DD: prostatitis, Pyelo, musculoskeletal - Urine culture - POCT Urinalysis, Dipstick Call back if not relieved w/Tx for prostatitis, will consider PT, MRI thoracolumbar

## 2013-11-16 NOTE — Progress Notes (Signed)
Pre visit review using our clinic review tool, if applicable. No additional management support is needed unless otherwise documented below in the visit note. 

## 2013-11-17 LAB — PSA: PSA: 0.75 ng/mL (ref 0.10–4.00)

## 2013-11-19 ENCOUNTER — Telehealth: Payer: Self-pay | Admitting: Nurse Practitioner

## 2013-11-19 NOTE — Telephone Encounter (Signed)
Patient states that he would like to talk to a nurse regarding his back pain. He would not go into details. He says that he has seen Layne regarding this.

## 2013-11-19 NOTE — Telephone Encounter (Signed)
Patient is still having urinary symptoms. Pt would like a refill on cipro. Pt stated that cipro seemed to help his symptoms. Pt wanting results for urine culture.

## 2013-11-20 ENCOUNTER — Encounter: Payer: Self-pay | Admitting: Family

## 2013-11-20 NOTE — Telephone Encounter (Signed)
Please check on urine culture results.

## 2013-11-20 NOTE — Telephone Encounter (Signed)
I called [facility], they stated they didn't receive the specimen.

## 2013-12-09 NOTE — Telephone Encounter (Signed)
Lab order

## 2013-12-10 ENCOUNTER — Other Ambulatory Visit: Payer: Self-pay

## 2013-12-11 ENCOUNTER — Other Ambulatory Visit: Payer: Self-pay

## 2013-12-11 MED ORDER — VALSARTAN 80 MG PO TABS: 80.0000 mg | ORAL_TABLET | Freq: Every day | ORAL | Status: DC

## 2014-01-05 ENCOUNTER — Other Ambulatory Visit: Payer: Self-pay | Admitting: *Deleted

## 2014-01-05 DIAGNOSIS — E785 Hyperlipidemia, unspecified: Secondary | ICD-10-CM

## 2014-01-05 MED ORDER — FENOFIBRATE MICRONIZED 130 MG PO CAPS: 130.0000 mg | ORAL_CAPSULE | Freq: Every day | ORAL | Status: DC

## 2014-02-09 ENCOUNTER — Ambulatory Visit: Payer: 59 | Admitting: Nurse Practitioner

## 2014-02-15 DIAGNOSIS — J61 Pneumoconiosis due to asbestos and other mineral fibers: Secondary | ICD-10-CM | POA: Insufficient documentation

## 2014-02-15 DIAGNOSIS — H3562 Retinal hemorrhage, left eye: Secondary | ICD-10-CM | POA: Insufficient documentation

## 2014-02-15 DIAGNOSIS — R739 Hyperglycemia, unspecified: Secondary | ICD-10-CM | POA: Insufficient documentation

## 2014-02-15 DIAGNOSIS — R42 Dizziness and giddiness: Secondary | ICD-10-CM | POA: Insufficient documentation

## 2014-02-15 DIAGNOSIS — M79673 Pain in unspecified foot: Secondary | ICD-10-CM | POA: Insufficient documentation

## 2014-02-15 DIAGNOSIS — E781 Pure hyperglyceridemia: Secondary | ICD-10-CM | POA: Insufficient documentation

## 2014-02-15 DIAGNOSIS — J309 Allergic rhinitis, unspecified: Secondary | ICD-10-CM | POA: Insufficient documentation

## 2014-02-15 DIAGNOSIS — H547 Unspecified visual loss: Secondary | ICD-10-CM | POA: Insufficient documentation

## 2014-02-15 DIAGNOSIS — M2550 Pain in unspecified joint: Secondary | ICD-10-CM | POA: Insufficient documentation

## 2014-02-15 DIAGNOSIS — L570 Actinic keratosis: Secondary | ICD-10-CM | POA: Insufficient documentation

## 2014-02-15 DIAGNOSIS — J209 Acute bronchitis, unspecified: Secondary | ICD-10-CM | POA: Insufficient documentation

## 2014-02-15 DIAGNOSIS — M7740 Metatarsalgia, unspecified foot: Secondary | ICD-10-CM | POA: Insufficient documentation

## 2014-02-15 DIAGNOSIS — R7301 Impaired fasting glucose: Secondary | ICD-10-CM | POA: Insufficient documentation

## 2014-02-15 DIAGNOSIS — K760 Fatty (change of) liver, not elsewhere classified: Secondary | ICD-10-CM | POA: Insufficient documentation

## 2014-03-08 ENCOUNTER — Ambulatory Visit: Payer: 59 | Admitting: Nurse Practitioner

## 2014-03-19 ENCOUNTER — Other Ambulatory Visit: Payer: Self-pay

## 2014-03-19 MED ORDER — VALSARTAN 80 MG PO TABS: 80.0000 mg | ORAL_TABLET | Freq: Every day | ORAL | Status: DC

## 2014-04-02 ENCOUNTER — Encounter: Payer: Self-pay | Admitting: Nurse Practitioner

## 2014-04-02 ENCOUNTER — Ambulatory Visit: Payer: 59 | Admitting: Nurse Practitioner

## 2014-04-02 ENCOUNTER — Ambulatory Visit (INDEPENDENT_AMBULATORY_CARE_PROVIDER_SITE_OTHER): Payer: 59 | Admitting: Nurse Practitioner

## 2014-04-02 VITALS — BP 151/75 | HR 74 | Temp 98.2°F | Ht 68.0 in | Wt 181.0 lb

## 2014-04-02 DIAGNOSIS — E785 Hyperlipidemia, unspecified: Secondary | ICD-10-CM

## 2014-04-02 DIAGNOSIS — I10 Essential (primary) hypertension: Secondary | ICD-10-CM

## 2014-04-02 DIAGNOSIS — Z87898 Personal history of other specified conditions: Secondary | ICD-10-CM

## 2014-04-02 DIAGNOSIS — E119 Type 2 diabetes mellitus without complications: Secondary | ICD-10-CM

## 2014-04-02 DIAGNOSIS — R1012 Left upper quadrant pain: Secondary | ICD-10-CM

## 2014-04-02 DIAGNOSIS — Z87438 Personal history of other diseases of male genital organs: Secondary | ICD-10-CM

## 2014-04-02 DIAGNOSIS — Z8249 Family history of ischemic heart disease and other diseases of the circulatory system: Secondary | ICD-10-CM

## 2014-04-02 LAB — HEMOGLOBIN A1C
Hgb A1c MFr Bld: 6.3 % — ABNORMAL HIGH (ref <5.7)
Mean Plasma Glucose: 134 mg/dL — ABNORMAL HIGH (ref <117)

## 2014-04-02 MED ORDER — FLUVASTATIN SODIUM 40 MG PO CAPS: 40.0000 mg | ORAL_CAPSULE | Freq: Every day | ORAL | Status: DC

## 2014-04-02 MED ORDER — ESOMEPRAZOLE MAGNESIUM 40 MG PO CPDR: 40.0000 mg | DELAYED_RELEASE_CAPSULE | Freq: Every day | ORAL | Status: DC

## 2014-04-02 MED ORDER — VALSARTAN 80 MG PO TABS: 80.0000 mg | ORAL_TABLET | Freq: Every day | ORAL | Status: DC

## 2014-04-02 MED ORDER — METFORMIN HCL 500 MG PO TABS: ORAL_TABLET | ORAL | Status: DC

## 2014-04-02 NOTE — Progress Notes (Signed)
Pre visit review using our clinic review tool, if applicable. No additional management support is needed unless otherwise documented below in the visit note. 

## 2014-04-02 NOTE — Patient Instructions (Addendum)
See cardiology.  Consider reading Eat to Live by Excell Seltzer and begin implementing principles. You can reverse heart disease and diabetes.  Start fluvastatin for cholesterol. Stop fenofibrate. Studies show lower death rates related to heart disease in people with diabetes taking statins.   Start omeprazole. I think stomach pain is related to gastritis/possibly ulcer. I will check pancreatitis labs.  My office will call with lab results.  I want to see you back in 3 months.  I am sorry to hear of your brother's passing.

## 2014-04-03 DIAGNOSIS — Z8249 Family history of ischemic heart disease and other diseases of the circulatory system: Secondary | ICD-10-CM | POA: Insufficient documentation

## 2014-04-03 DIAGNOSIS — R1012 Left upper quadrant pain: Secondary | ICD-10-CM | POA: Insufficient documentation

## 2014-04-03 LAB — PSA: PSA: 0.82 ng/mL (ref <=4.00)

## 2014-04-03 LAB — COMPREHENSIVE METABOLIC PANEL
ALT: 28 U/L (ref 0–53)
AST: 18 U/L (ref 0–37)
Albumin: 4.7 g/dL (ref 3.5–5.2)
Alkaline Phosphatase: 54 U/L (ref 39–117)
BILIRUBIN TOTAL: 0.5 mg/dL (ref 0.2–1.2)
BUN: 21 mg/dL (ref 6–23)
CO2: 27 mEq/L (ref 19–32)
CREATININE: 1.16 mg/dL (ref 0.50–1.35)
Calcium: 9.7 mg/dL (ref 8.4–10.5)
Chloride: 102 mEq/L (ref 96–112)
Glucose, Bld: 113 mg/dL — ABNORMAL HIGH (ref 70–99)
Potassium: 4 mEq/L (ref 3.5–5.3)
Sodium: 138 mEq/L (ref 135–145)
Total Protein: 6.8 g/dL (ref 6.0–8.3)

## 2014-04-03 LAB — MICROALBUMIN / CREATININE URINE RATIO
Creatinine, Urine: 99.9 mg/dL
Microalb Creat Ratio: 5 mg/g (ref 0.0–30.0)
Microalb, Ur: 0.5 mg/dL (ref 0.00–1.89)

## 2014-04-03 LAB — LIPASE: LIPASE: 20 U/L (ref 0–75)

## 2014-04-03 LAB — AMYLASE: AMYLASE: 42 U/L (ref 0–105)

## 2014-04-03 NOTE — Assessment & Plan Note (Signed)
Recent 10 lb. Weight gain. Not exercising. Compliant w/meds, no intol SE. Continue metformin. Get back to exercise & healthy diet. A1C, CMET today

## 2014-04-03 NOTE — Assessment & Plan Note (Signed)
PSA today

## 2014-04-03 NOTE — Assessment & Plan Note (Signed)
Given strong fam Hx of early death & MI, goal is now LDL under 2. Change to statin: ins will cover fluvastatin. Start at 40 mg qd. Must exercise. Cut out refined sugars, flours, ETOH. Lipids today.

## 2014-04-03 NOTE — Assessment & Plan Note (Signed)
Continue current med. Recent 10 lb. Weight gain. Get back to exercise & healthy diet.

## 2014-04-03 NOTE — Assessment & Plan Note (Addendum)
No abd tenderness.No HSM or masses. No diarrhea or constipation. Sometimes associated w/nausea. Prior imaging: ABD [place] 11/14- IMPRESSION: Possibly fatty liver, o/w nml DD: PUD, gastritis, pancreatitis, GB disease, heart disease Re-start nexium 40 mg qd. F/u 4 weeks.

## 2014-04-03 NOTE — Assessment & Plan Note (Signed)
LDL goal <70,  Daily exercise, healthy diet. Control HTN, ECG 11/14 NSR. Stress myoview 2013. Ref to cardio for eval & Tx.

## 2014-04-03 NOTE — Progress Notes (Signed)
Subjective:     Shawn Perez is a 48 y.o. male presents for follow up of chronic conditions: newly diagnosed DM, hyperlipidemia, HTN. He has new complaint of LUQ pain. In addition, he is concerned about his family Hx of premature heart disease as his 30 yo. brother died suddenly last week, from apparent MI. Mr. Muhl missed has last f/u appointment. It appears he went to see his historical provider, 1 month ago, Dr Ruben Gottron, w/[facility]. He has gained 10 llbs. in 4 mos., states he stopped exercising and reverted back to poor eating habits. Last OV he was c/o back pain which appeared to be MSK but began to radiate into groin. I treated him for prostatitis w/10 days cipro, pain resolved & not returned.  He is tolerating low dose metformin w/o SE.  He continues on fenofibrate because he has been resistant to switch to statin, although I have explained that studies show lower mortality rates in DM due to CVD. He thought statin caused back pain last spring, but appears to have been prostatitis.  I updated his family Hx: 3 of 5 siblings have had MI w/stent, 1 sibling dies at 14 of MI, Mother & MGM died before 21 of MI. Mat aunts & uncles had MI.  Regarding LUQ pain: started few days ago, radiates to back. Sometimes nauseous. No diarrhea or constipation, fever. He has had RUQ pain in past that radiated to back & resolved w/cutting back ETOH & starting nexium.   The following portions of the patient's history were reviewed and updated as appropriate: allergies, current medications, past family history, past medical history, past social history, past surgical history and problem list.  Review of Systems Pertinent items are noted in HPI.    Objective:    BP 151/75  Pulse 74  Temp(Src) 98.2 F (36.8 C) (Temporal)  Ht '5\' 8"'  (1.727 m)  Wt 181 lb (82.101 kg)  BMI 27.53 kg/m2  SpO2 95% BP 151/75  Pulse 74  Temp(Src) 98.2 F (36.8 C) (Temporal)  Ht '5\' 8"'  (1.727 m)  Wt 181 lb (82.101 kg)  BMI  27.53 kg/m2  SpO2 95% General appearance: alert, cooperative, appears stated age and no distress Head: Normocephalic, without obvious abnormality, atraumatic Eyes: negative findings: lids and lashes normal and conjunctivae and sclerae normal Lungs: clear to auscultation bilaterally Heart: regular rate and rhythm, S1, S2 normal, no murmur, click, rub or gallop Abdomen: soft, non-tender; bowel sounds normal; no masses,  no organomegaly Extremities: extremities normal, atraumatic, no cyanosis or edema Pulses: 2+ and symmetric    Assessment:     1. Diabetes mellitus type 2 in nonobese - metFORMIN (GLUCOPHAGE) 500 MG tablet; Take 1 T po qd with largest meal of day.  Dispense: 30 tablet; Refill: 3 - Comprehensive metabolic panel - Hemoglobin A1c - Amylase - Lipase - Urine Microalbumin w/creat. ratio  2. Essential hypertension, benign - Comprehensive metabolic panel -microalbumin w/creat. ratio  3. Abdominal pain, left upper quadrant - Comprehensive metabolic panel - PSA - Amylase - Lipase - esomeprazole (NEXIUM) 40 MG capsule; Take 1 capsule (40 mg total) by mouth daily.  Dispense: 30 capsule; Refill: 3  4. Family history of premature CAD - Ambulatory referral to Cardiology  5. History of prostatitis - PSA 6. Hyperlipidemia LDL goal <70 - fluvastatin (LESCOL) 40 MG capsule; Take 1 capsule (40 mg total) by mouth at bedtime.  Dispense: 30 capsule; Refill: 3 - Comprehensive metabolic panel Ret for fasting appt. For lipids.  See problem list  for complete A&P See pt instructions. F/u 3 mos fasting, lipids, A1C

## 2014-04-06 ENCOUNTER — Telehealth: Payer: Self-pay | Admitting: Nurse Practitioner

## 2014-04-06 DIAGNOSIS — E785 Hyperlipidemia, unspecified: Secondary | ICD-10-CM

## 2014-04-06 MED ORDER — ROSUVASTATIN CALCIUM 10 MG PO TABS: 10.0000 mg | ORAL_TABLET | Freq: Every day | ORAL | Status: DC

## 2014-04-06 NOTE — Telephone Encounter (Signed)
Spoke w/pt, discussed lab results. A1C improved although he stopped metformin-didn't tell me that at last appt. Although he gained 10 lbs, A1C improved. Will d/c, continue w/diet changes.  Took 1 tab fluvastatin- felt dizzy next am. Wants to go back to [facility]-will prescribe. asked pt to remain w/1 provider (saw Dr Geoffry Paradise 01/2014). Pt states he will continue w/ [facility]. Has cardio appt. Next mo. Stomach pain improved w/re-starting nexium. Will continue at 40 mg for 1 mo, then decrease to 20 mg is no symptoms. Amylase & lipase nml.

## 2014-05-06 ENCOUNTER — Ambulatory Visit: Payer: 59 | Admitting: Internal Medicine

## 2014-05-14 ENCOUNTER — Ambulatory Visit (HOSPITAL_COMMUNITY): Payer: 59 | Attending: Internal Medicine | Admitting: Cardiology

## 2014-05-14 ENCOUNTER — Encounter: Payer: Self-pay | Admitting: Internal Medicine

## 2014-05-14 ENCOUNTER — Ambulatory Visit (INDEPENDENT_AMBULATORY_CARE_PROVIDER_SITE_OTHER): Payer: 59 | Admitting: Internal Medicine

## 2014-05-14 VITALS — BP 125/77 | HR 67 | Ht 68.0 in | Wt 178.0 lb

## 2014-05-14 DIAGNOSIS — E785 Hyperlipidemia, unspecified: Secondary | ICD-10-CM

## 2014-05-14 DIAGNOSIS — I10 Essential (primary) hypertension: Secondary | ICD-10-CM

## 2014-05-14 DIAGNOSIS — Z8249 Family history of ischemic heart disease and other diseases of the circulatory system: Secondary | ICD-10-CM

## 2014-05-14 NOTE — Progress Notes (Signed)
HPI Patient is a 48 yo who is referred for cardiac risk stratificaitoin. The patinet has a history of HTN, type II DM, HL   He also has a strong Fx of CAD. She was seen in medicine clinic recently.  Did have some complaints of back pain  Worse with motion of back  Not associated with walking  No SOB  He is fairly active.  Denies CP with activity.    Allergies  Allergen Reactions  . Penicillins Rash    Happened in childhood    Current Outpatient Prescriptions  Medication Sig Dispense Refill  . esomeprazole (NEXIUM) 40 MG capsule Take 1 capsule (40 mg total) by mouth daily.  30 capsule  3  . latanoprost (XALATAN) 0.005 % ophthalmic solution Place 1 drop into both eyes at bedtime.       . metFORMIN (GLUCOPHAGE) 500 MG tablet 1 TAB BY MOUTH DAILY      . rosuvastatin (CRESTOR) 10 MG tablet Take 1 tablet (10 mg total) by mouth daily.  30 tablet  3  . valsartan (DIOVAN) 80 MG tablet Take 1 tablet (80 mg total) by mouth daily.  30 tablet  3   No current facility-administered medications for this visit.    Past Medical History  Diagnosis Date  . Heart murmur     since birth  . Hypertension   . Hyperlipidemia 2011  . Sebaceous cyst     axillary  . Temporomandibular jaw dysfunction   . Diabetes mellitus without complication     Past Surgical History  Procedure Laterality Date  . No past surgeries  07/09/2011    Denies surgical history    Family History  Problem Relation Age of Onset  . Hypertension Mother   . Diabetes Mother   . Early death Mother   . Heart disease Mother   . Heart disease Sister   . Early death Brother   . Heart disease Brother   . Hyperlipidemia Brother   . Hypertension Brother   . Early death Maternal Aunt   . Heart disease Maternal Aunt   . Early death Maternal Uncle   . Heart disease Maternal Uncle   . Heart disease Maternal Grandmother   . Early death Maternal Grandmother   . Heart disease Brother     History   Social History  . Marital  Status: Married    Spouse Name: Jeani Hawking    Number of Children: N/A  . Years of Education: N/A   Occupational History  .     Social History Main Topics  . Smoking status: Former Research scientist (life sciences)  . Smokeless tobacco: Never Used     Comment: quit in 2000 smike 1 ppd  . Alcohol Use: Yes  . Drug Use: No  . Sexual Activity: Yes   Other Topics Concern  . Not on file   Social History Narrative  . No narrative on file    Review of Systems:  All systems reviewed.  They are negative to the above problem except as previously stated.  Vital Signs: BP 125/77  Pulse 67  Ht 5\' 8"  (1.727 m)  Wt 178 lb (80.74 kg)  BMI 27.07 kg/m2  Physical Exam  HEENT:  Normocephalic, atraumatic. EOMI, PERRLA.  Neck: JVP is normal.  No bruits.  Lungs: clear to auscultation. No rales no wheezes.  Heart: Regular rate and rhythm. Normal S1, S2. No S3.   No significant murmurs. PMI not displaced.  Abdomen:  Supple, nontender. Normal bowel sounds. No masses. No  hepatomegaly.  Extremities:   Good distal pulses throughout. No lower extremity edema.  Musculoskeletal :moving all extremities.  Neuro:   alert and oriented x3.  CN II-XII grossly intact.  EKG  SR 67   Assessment and Plan:  Patient is a a89 yo with no known CAD  Does have a very strong FHx of CAD  He also has HTN, HL and DM which is being treated. I am not convinced his pains are cardiac in orign  I would recomm vascular screen to look at plaquing.  2. me regimen for now  Last lipids were 2014  LDL was 95  Further goals will be determined by results of vascuscreen  Encouraged him to stay active.  Find out details on family (lipids)

## 2014-05-14 NOTE — Progress Notes (Signed)
Carotid and Aorta vascular screening performed

## 2014-05-14 NOTE — Patient Instructions (Signed)
Your physician recommends that you continue on your current medications as directed. Please refer to the Current Medication list given to you today.  Today you can schedule a vascuscreen.  This will be an ultrasound of your carotid arteries and aorta.

## 2014-07-02 ENCOUNTER — Encounter: Payer: Self-pay | Admitting: Nurse Practitioner

## 2014-07-02 ENCOUNTER — Ambulatory Visit (INDEPENDENT_AMBULATORY_CARE_PROVIDER_SITE_OTHER): Payer: 59 | Admitting: Nurse Practitioner

## 2014-07-02 VITALS — BP 134/81 | HR 72 | Temp 98.0°F | Resp 18 | Ht 68.0 in | Wt 177.0 lb

## 2014-07-02 DIAGNOSIS — M778 Other enthesopathies, not elsewhere classified: Secondary | ICD-10-CM

## 2014-07-02 DIAGNOSIS — R7989 Other specified abnormal findings of blood chemistry: Secondary | ICD-10-CM | POA: Insufficient documentation

## 2014-07-02 DIAGNOSIS — D473 Essential (hemorrhagic) thrombocythemia: Secondary | ICD-10-CM

## 2014-07-02 DIAGNOSIS — E559 Vitamin D deficiency, unspecified: Secondary | ICD-10-CM

## 2014-07-02 DIAGNOSIS — F411 Generalized anxiety disorder: Secondary | ICD-10-CM

## 2014-07-02 DIAGNOSIS — E785 Hyperlipidemia, unspecified: Secondary | ICD-10-CM

## 2014-07-02 DIAGNOSIS — M659 Synovitis and tenosynovitis, unspecified: Secondary | ICD-10-CM

## 2014-07-02 DIAGNOSIS — I10 Essential (primary) hypertension: Secondary | ICD-10-CM

## 2014-07-02 DIAGNOSIS — E119 Type 2 diabetes mellitus without complications: Secondary | ICD-10-CM

## 2014-07-02 DIAGNOSIS — IMO0002 Reserved for concepts with insufficient information to code with codable children: Secondary | ICD-10-CM | POA: Insufficient documentation

## 2014-07-02 MED ORDER — FLUOXETINE HCL 20 MG PO TABS: 20.0000 mg | ORAL_TABLET | Freq: Every day | ORAL | Status: DC

## 2014-07-02 MED ORDER — VALSARTAN 80 MG PO TABS: 80.0000 mg | ORAL_TABLET | Freq: Every day | ORAL | Status: DC

## 2014-07-02 NOTE — Patient Instructions (Signed)
Avoid torsion & bending of elbow. Apply aspercream to tendon three times daily-nickel-sized amount. See orthopedist.  Get labs done. My office will call with follow up.  Continue meds: Diovan in  Morning Metformin at lunch fluvastatin at supper Fluoxetine at bedtime.  Continue exercise & healthy food choices.  See you in 6 months.

## 2014-07-02 NOTE — Assessment & Plan Note (Signed)
Continue current med Lipids today Continue exercise & healthy food choices as discussed.

## 2014-07-02 NOTE — Assessment & Plan Note (Signed)
Pt started fluoxetine 20 mg qd about 5 weeks ago. Feels better when taking-"takes edge off". Continue meds.

## 2014-07-02 NOTE — Progress Notes (Signed)
Pre visit review using our clinic review tool, if applicable. No additional management support is needed unless otherwise documented below in the visit note. 

## 2014-07-02 NOTE — Assessment & Plan Note (Signed)
Able to reproduce pain at proximal ulnar tendon.  Aspercream tid Avoid twisting of forearm, keep elbow extended more than flexed Ref to sports med.

## 2014-07-02 NOTE — Progress Notes (Signed)
Subjective:     Shawn Perez is a 48 y.o. male returns for follow up of chronic conditions-htn, DM, dylipidemia; and has new complaint of L elbow pain that radiates to first 3 fingers & shoulder.  Re HTN, he has fair control in ofc today. RF incl male, dyslipidemia, fam Hx premature Heart disease. No microalbuminuria. CMET nml. Tolerating diovan w/no SE. Re DM: I started metformin 500 mg qd after 2 elevated A1C ( 6.8 & 6.6) . Pt took medication for a while, but stopped. Last A1C had improved w/diet & exercise-6.3. Will repeat today. No reported s&s of diabetes. 4 lb. Wt loss since last OV. Re dyslipidemia: current med fluvastatin-tolerating w/out SE. Repeat lipids today. In reviewing previous labs, platelet count slightly elevated, will repeat CBC. No Hx of bleeding easily ot thrombosis.  Shawn Perez has new complaint of L elbow pain started 1 week ago, pain radiated to shoulder, wrist & hand. He thought it was related to diabetes, so he started metformin again. Coincidentally, elbow pain is better, but he still has tingling sensation in 1st 3 fingers & wrist. Pain is low to moderate.  The following portions of the patient's history were reviewed and updated as appropriate: allergies, current medications, past family history, past medical history, past social history, past surgical history and problem list.  Review of Systems Pertinent items are noted in HPI.    Objective:    BP 134/81 mmHg  Pulse 72  Temp(Src) 98 F (36.7 C) (Temporal)  Resp 18  Ht 5\' 8"  (1.727 m)  Wt 177 lb (80.287 kg)  BMI 26.92 kg/m2  SpO2 95% BP 134/81 mmHg  Pulse 72  Temp(Src) 98 F (36.7 C) (Temporal)  Resp 18  Ht 5\' 8"  (1.727 m)  Wt 177 lb (80.287 kg)  BMI 26.92 kg/m2  SpO2 95% General appearance: alert, cooperative, appears stated age and no distress Head: Normocephalic, without obvious abnormality, atraumatic Eyes: negative findings: lids and lashes normal, conjunctivae and sclerae normal and wearing  glasses Neck: no carotid bruit and supple, symmetrical, trachea midline Lungs: clear to auscultation bilaterally Heart: regular rate and rhythm, S1, S2 normal, no murmur, click, rub or gallop Extremities: FROM in neck, FROM in shoulders & elbow. pos Tinel's sign. Tender at extensor radialis   Back: no tenderness over c-s or T-s.   Assessment:     1. Left elbow tendonitis - Ambulatory referral to Orthopedics  2. Generalized anxiety disorder - FLUoxetine (PROZAC) 20 MG tablet; Take 1 tablet (20 mg total) by mouth daily.  Dispense: 90 tablet; Refill: 1  3. Essential hypertension - valsartan (DIOVAN) 80 MG tablet; Take 1 tablet (80 mg total) by mouth daily.  Dispense: 90 tablet; Refill: 1 - CBC; Future  4. Dyslipidemia - Lipid panel; Future  5. Elevated platelet count - CBC; Future  6. Vitamin D deficiency - Vit D  25 hydroxy (rtn osteoporosis monitoring); Future  7. Diabetes mellitus type 2 in nonobese - Hemoglobin A1c; Future  See patient instructions for complete plan. See problem list for complete A&P F/u 6 mos.

## 2014-07-02 NOTE — Assessment & Plan Note (Signed)
BP well controlled RF: male, dyslipidemia, fam Hx heart ds incl premature death No microalbuminuria Continue current meds.

## 2014-07-08 ENCOUNTER — Ambulatory Visit (INDEPENDENT_AMBULATORY_CARE_PROVIDER_SITE_OTHER): Payer: 59 | Admitting: Family Medicine

## 2014-07-08 ENCOUNTER — Encounter: Payer: Self-pay | Admitting: Family Medicine

## 2014-07-08 ENCOUNTER — Other Ambulatory Visit: Payer: 59

## 2014-07-08 VITALS — BP 130/84 | HR 67 | Ht 68.0 in | Wt 171.0 lb

## 2014-07-08 DIAGNOSIS — R7989 Other specified abnormal findings of blood chemistry: Secondary | ICD-10-CM

## 2014-07-08 DIAGNOSIS — E785 Hyperlipidemia, unspecified: Secondary | ICD-10-CM

## 2014-07-08 DIAGNOSIS — I10 Essential (primary) hypertension: Secondary | ICD-10-CM

## 2014-07-08 DIAGNOSIS — M659 Synovitis and tenosynovitis, unspecified: Secondary | ICD-10-CM

## 2014-07-08 DIAGNOSIS — M25522 Pain in left elbow: Secondary | ICD-10-CM

## 2014-07-08 DIAGNOSIS — E119 Type 2 diabetes mellitus without complications: Secondary | ICD-10-CM

## 2014-07-08 DIAGNOSIS — M7712 Lateral epicondylitis, left elbow: Secondary | ICD-10-CM

## 2014-07-08 DIAGNOSIS — E559 Vitamin D deficiency, unspecified: Secondary | ICD-10-CM

## 2014-07-08 DIAGNOSIS — M778 Other enthesopathies, not elsewhere classified: Secondary | ICD-10-CM

## 2014-07-08 DIAGNOSIS — M542 Cervicalgia: Secondary | ICD-10-CM

## 2014-07-08 LAB — HEMOGLOBIN A1C
Hgb A1c MFr Bld: 6.7 % — ABNORMAL HIGH (ref <5.7)
Mean Plasma Glucose: 146 mg/dL — ABNORMAL HIGH (ref <117)

## 2014-07-08 LAB — CBC
HCT: 45.5 % (ref 39.0–52.0)
Hemoglobin: 15.7 g/dL (ref 13.0–17.0)
MCH: 31.7 pg (ref 26.0–34.0)
MCHC: 34.5 g/dL (ref 30.0–36.0)
MCV: 91.7 fL (ref 78.0–100.0)
Platelets: 273 10*3/uL (ref 150–400)
RBC: 4.96 MIL/uL (ref 4.22–5.81)
RDW: 13.2 % (ref 11.5–15.5)
WBC: 5.6 10*3/uL (ref 4.0–10.5)

## 2014-07-08 LAB — LIPID PANEL
CHOL/HDL RATIO: 4.5 ratio
Cholesterol: 183 mg/dL (ref 0–200)
HDL: 41 mg/dL (ref >39)
LDL Cholesterol: 100 mg/dL — ABNORMAL HIGH (ref 0–99)
TRIGLYCERIDES: 212 mg/dL — AB (ref <150)
VLDL: 42 mg/dL — AB (ref 0–40)

## 2014-07-08 MED ORDER — METHOCARBAMOL 500 MG PO TABS: 500.0000 mg | ORAL_TABLET | Freq: Three times a day (TID) | ORAL | Status: DC | PRN

## 2014-07-08 NOTE — Patient Instructions (Signed)
You have lateral epicondylitis Try to avoid painful activities as much as possible. Ice the area 3-4 times a day for 15 minutes at a time. Tylenol or aleve as needed for pain. Counterforce brace as directed can help unload area - wear this regularly if it provides you with relief. Hammer exercises and wrist extension with 1 pound weight 3 sets of 10 once a day Consider physical therapy for this. Consider injection for short term pain relief if the above is not helping. Consider nitro patches in future also if not improving.  You have a cervical strain. Aleve 2 tabs twice a day with food OR ibuprofen 600mg  three times a day with food for pain and inflammation. Robaxin three times a day as needed for muscle spasms (can make you sleepy - if so do not drive while taking this). Simple range of motion exercises within limits of pain to prevent further stiffness. See handout for exercises. Consider physical therapy for stretching, exercises, traction, and modalities. Call us if your insurance would cover this and it isn't too expensive and we will put the referral in. Heat 15 minutes at a time 3-4 times a day to help with spasms. Watch head position when on computers, texting, when sleeping in bed - should in line with back to prevent further spasms. Follow up with me in 5-6 weeks.

## 2014-07-09 ENCOUNTER — Telehealth: Payer: Self-pay | Admitting: Nurse Practitioner

## 2014-07-09 LAB — VITAMIN D 25 HYDROXY (VIT D DEFICIENCY, FRACTURES): Vit D, 25-Hydroxy: 35 ng/mL (ref 30–89)

## 2014-07-09 NOTE — Telephone Encounter (Signed)
LMOVM for pt to return call 

## 2014-07-09 NOTE — Telephone Encounter (Signed)
pls call pt: Advise cholsterol looks about same-slight improvement. A1c looks worse/diabetes-needs to take metformin. Vit D low-take 1000 iu D3 daily with meal.  Make sure he has f/u apt. In 3 mos.

## 2014-07-09 NOTE — Telephone Encounter (Signed)
Patient notified of results. Patient expressed understanding.  

## 2014-07-12 ENCOUNTER — Encounter: Payer: Self-pay | Admitting: Family Medicine

## 2014-07-12 ENCOUNTER — Encounter: Payer: Self-pay | Admitting: Nurse Practitioner

## 2014-07-12 DIAGNOSIS — M25522 Pain in left elbow: Secondary | ICD-10-CM | POA: Insufficient documentation

## 2014-07-12 DIAGNOSIS — M542 Cervicalgia: Secondary | ICD-10-CM | POA: Insufficient documentation

## 2014-07-12 NOTE — Assessment & Plan Note (Signed)
2/2 lateral epicondylitis.  Has improved recently.  Shown home exercises to do daily.  Counterforce brace with icing as needed.  Consider physical therapy, injection, nitro patches if not improving.

## 2014-07-12 NOTE — Progress Notes (Signed)
PCP and referred by: Irene Pap, NP  Subjective:   HPI: Patient is a 48 y.o. male here for left elbow and neck pain.  Patient reports for about a month he has had lateral left elbow pain. No known injury. 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 No swelling or bruising. Gets tingling sensations dorsal hand at times. Has improved recently however. Also reports neck pain more on right side. Constant every day pain that is worse at nighttime. This has also been going on about a month. No radiation into extremities. No prior issues with neck or elbow.  Past Medical History  Diagnosis Date  . Heart murmur     since birth  . Hypertension   . Hyperlipidemia 2011  . Sebaceous cyst     axillary  . Temporomandibular jaw dysfunction   . Diabetes mellitus without complication     Current Outpatient Prescriptions on File Prior to Visit  Medication Sig Dispense Refill  . esomeprazole (NEXIUM) 40 MG capsule Take 1 capsule (40 mg total) by mouth daily. 30 capsule 3  . FLUoxetine (PROZAC) 20 MG tablet Take 1 tablet (20 mg total) by mouth daily. 90 tablet 1  . fluvastatin (LESCOL) 40 MG capsule Take 40 mg by mouth daily.  3  . latanoprost (XALATAN) 0.005 % ophthalmic solution Place 1 drop into both eyes at bedtime.     . metFORMIN (GLUCOPHAGE) 500 MG tablet 1 TAB BY MOUTH DAILY    . valsartan (DIOVAN) 80 MG tablet Take 1 tablet (80 mg total) by mouth daily. 90 tablet 1   No current facility-administered medications on file prior to visit.    Past Surgical History  Procedure Laterality Date  . No past surgeries  07/09/2011    Denies surgical history    Allergies  Allergen Reactions  . Penicillins Rash    Happened in childhood    History   Social History  . Marital Status: Married    Spouse Name: Jeani Hawking    Number of Children: N/A  . Years of Education: N/A   Occupational History  .     Social History Main Topics  . Smoking status: Former Research scientist (life sciences)  .  Smokeless tobacco: Never Used     Comment: quit in 2000 smike 1 ppd  . Alcohol Use: 0.0 oz/week    0 Not specified per week  . Drug Use: No  . Sexual Activity: Yes   Other Topics Concern  . Not on file   Social History Narrative    Family History  Problem Relation Age of Onset  . Hypertension Mother   . Diabetes Mother   . Early death Mother   . Heart disease Mother   . Heart disease Sister   . Early death Brother   . Heart disease Brother   . Hyperlipidemia Brother   . Hypertension Brother   . Early death Maternal Aunt   . Heart disease Maternal Aunt   . Early death Maternal Uncle   . Heart disease Maternal Uncle   . Heart disease Maternal Grandmother   . Early death Maternal Grandmother   . Heart disease Brother     BP 130/84 mmHg  Pulse 67  Ht 5\' 8"  (1.727 m)  Wt 171 lb (77.565 kg)  BMI 26.01 kg/m2  Review of Systems: See HPI above.    Objective:  Physical Exam:  Gen: NAD  Left elbow: No gross deformity, swelling, bruising. Mild TTP lateral epicondyle. FROM elbow without  pain.  Mild pain resisted wrist extension only. Collateral ligaments intact. NVI distally.  Neck: No gross deformity, swelling, bruising. TTP mildly right cervical paraspinal region.  No midline/bony TTP. FROM neck - pain mildly with bilateral lateral rotations on right side. BUE strength 5/5.   Sensation intact to light touch currently.   2+ equal reflexes in triceps, biceps, brachioradialis tendons. Negative spurlings. NV intact distal BUEs.    Assessment & Plan:  1. Left elbow pain - 2/2 lateral epicondylitis.  Has improved recently.  Shown home exercises to do daily.  Counterforce brace with icing as needed.  Consider physical therapy, injection, nitro patches if not improving.  2. Neck pain - 2/2 cervical strain.  Consider physical therapy.  Handout provided as well as home exercises/stretches to do.  NSAIDs with robaxin as needed.  Ergonomic issues discussed.  F/u in 5-6  weeks.

## 2014-07-12 NOTE — Assessment & Plan Note (Signed)
2/2 cervical strain.  Consider physical therapy.  Handout provided as well as home exercises/stretches to do.  NSAIDs with robaxin as needed.  Ergonomic issues discussed.  F/u in 5-6 weeks.

## 2014-07-20 ENCOUNTER — Encounter: Payer: Self-pay | Admitting: *Deleted

## 2014-08-10 ENCOUNTER — Telehealth: Payer: Self-pay | Admitting: Nurse Practitioner

## 2014-08-10 NOTE — Telephone Encounter (Signed)
Patient called in. FreeStyle is too expensive, can patient get One Touch instead? Needs Rx called in to [facility] in [place]. He will need kits, strips, & lancets.

## 2014-08-10 NOTE — Telephone Encounter (Signed)
Please advise 

## 2014-08-11 ENCOUNTER — Other Ambulatory Visit: Payer: Self-pay | Admitting: Nurse Practitioner

## 2014-08-11 DIAGNOSIS — E119 Type 2 diabetes mellitus without complications: Secondary | ICD-10-CM

## 2014-08-11 MED ORDER — ONETOUCH ULTRA SYSTEM W/DEVICE KIT: 1.0000 | PACK | Freq: Once | Status: DC

## 2014-08-11 MED ORDER — GLUCOSE BLOOD VI STRP: ORAL_STRIP | Status: DC

## 2014-08-11 MED ORDER — ONETOUCH ULTRASOFT LANCETS MISC: Status: DC

## 2014-08-11 NOTE — Progress Notes (Signed)
Patient notified. Patient stated that he does not need any teaching.

## 2014-08-11 NOTE — Progress Notes (Signed)
LMOVM for pt to return call 

## 2014-08-12 ENCOUNTER — Ambulatory Visit: Payer: 59 | Admitting: Family Medicine

## 2014-10-11 ENCOUNTER — Telehealth: Payer: Self-pay | Admitting: Nurse Practitioner

## 2014-10-11 ENCOUNTER — Other Ambulatory Visit: Payer: Self-pay | Admitting: Nurse Practitioner

## 2014-10-11 DIAGNOSIS — E785 Hyperlipidemia, unspecified: Secondary | ICD-10-CM

## 2014-10-11 MED ORDER — FLUVASTATIN SODIUM 40 MG PO CAPS: 40.0000 mg | ORAL_CAPSULE | Freq: Every day | ORAL | Status: DC

## 2014-10-11 NOTE — Telephone Encounter (Signed)
Fluvastatin NA 40MG  Cap last fill date 09/01/14

## 2014-10-19 NOTE — Telephone Encounter (Signed)
Epic shows that rx was filled 10/11/14 #30 x3 refills. Called pharmacy to make sure they received rx. Pharmacy did receive rx and they stated they pt had already picked med up. Done.

## 2014-11-17 ENCOUNTER — Telehealth: Payer: Self-pay | Admitting: Family Medicine

## 2014-11-17 NOTE — Telephone Encounter (Signed)
Pt insisted that he talk to [facility].  He didn't want to speak to anyone else.  Please advise.

## 2014-11-17 NOTE — Telephone Encounter (Signed)
LMOVM asking patient to CB 

## 2014-11-17 NOTE — Telephone Encounter (Signed)
pls call pt: Advise I am with patients. Ask what is nature of call.

## 2014-11-18 NOTE — Telephone Encounter (Signed)
LMOVM, will await a call back. I am going to close encounter.

## 2014-12-06 DIAGNOSIS — R5383 Other fatigue: Secondary | ICD-10-CM | POA: Insufficient documentation

## 2014-12-06 DIAGNOSIS — G4719 Other hypersomnia: Secondary | ICD-10-CM | POA: Insufficient documentation

## 2014-12-13 ENCOUNTER — Other Ambulatory Visit: Payer: Self-pay

## 2014-12-13 DIAGNOSIS — E119 Type 2 diabetes mellitus without complications: Secondary | ICD-10-CM

## 2014-12-13 MED ORDER — METFORMIN HCL 500 MG PO TABS: 500.0000 mg | ORAL_TABLET | Freq: Every day | ORAL | Status: DC

## 2014-12-13 NOTE — Telephone Encounter (Signed)
Please Advise Refill Request? Refill request for- Metformin 500mg  Last filled by MD on - 04/02/14 Last Appt - 07/02/14        Next Appt - 12/31/14 Pharmacy- [facility] [place]

## 2014-12-31 ENCOUNTER — Ambulatory Visit: Payer: 59 | Admitting: Nurse Practitioner

## 2015-02-08 ENCOUNTER — Ambulatory Visit: Payer: 59 | Admitting: Nurse Practitioner

## 2015-02-16 ENCOUNTER — Other Ambulatory Visit: Payer: Self-pay | Admitting: Family Medicine

## 2015-02-16 DIAGNOSIS — F411 Generalized anxiety disorder: Secondary | ICD-10-CM

## 2015-02-16 DIAGNOSIS — E119 Type 2 diabetes mellitus without complications: Secondary | ICD-10-CM

## 2015-02-16 MED ORDER — METFORMIN HCL 500 MG PO TABS: 500.0000 mg | ORAL_TABLET | Freq: Every day | ORAL | Status: DC

## 2015-02-16 MED ORDER — FLUOXETINE HCL 20 MG PO TABS: 20.0000 mg | ORAL_TABLET | Freq: Every day | ORAL | Status: DC

## 2015-02-16 NOTE — Telephone Encounter (Signed)
I sent 30 days of meds. He needs OV. Thnx

## 2015-02-21 ENCOUNTER — Other Ambulatory Visit: Payer: Self-pay | Admitting: Family Medicine

## 2015-02-21 DIAGNOSIS — E785 Hyperlipidemia, unspecified: Secondary | ICD-10-CM

## 2015-02-21 MED ORDER — FLUVASTATIN SODIUM 40 MG PO CAPS: 40.0000 mg | ORAL_CAPSULE | Freq: Every day | ORAL | Status: DC

## 2015-02-21 NOTE — Telephone Encounter (Signed)
Pt was to come in feb, didn't.  I will send in 30 days of fluvastatin. No more until he comes in for appt & labs. He needs to come fasting.

## 2015-02-21 NOTE — Telephone Encounter (Signed)
Patient aware.

## 2015-02-21 NOTE — Telephone Encounter (Signed)
Patient requesting rf of fluvastatin.  Upcoming appointment 03/02/15.

## 2015-03-02 ENCOUNTER — Encounter: Payer: Self-pay | Admitting: Nurse Practitioner

## 2015-03-02 ENCOUNTER — Ambulatory Visit (HOSPITAL_BASED_OUTPATIENT_CLINIC_OR_DEPARTMENT_OTHER)
Admission: RE | Admit: 2015-03-02 | Discharge: 2015-03-02 | Disposition: A | Discharge status: Home / Self Care | Payer: 59 | Source: Ambulatory Visit | Attending: Nurse Practitioner | Admitting: Nurse Practitioner

## 2015-03-02 ENCOUNTER — Ambulatory Visit (INDEPENDENT_AMBULATORY_CARE_PROVIDER_SITE_OTHER): Payer: 59 | Admitting: Nurse Practitioner

## 2015-03-02 ENCOUNTER — Telehealth: Payer: Self-pay | Admitting: Nurse Practitioner

## 2015-03-02 VITALS — BP 131/83 | HR 69 | Temp 98.4°F | Resp 16 | Ht 68.0 in | Wt 180.0 lb

## 2015-03-02 DIAGNOSIS — R05 Cough: Secondary | ICD-10-CM

## 2015-03-02 DIAGNOSIS — E119 Type 2 diabetes mellitus without complications: Secondary | ICD-10-CM | POA: Diagnosis not present

## 2015-03-02 DIAGNOSIS — E785 Hyperlipidemia, unspecified: Secondary | ICD-10-CM | POA: Diagnosis not present

## 2015-03-02 DIAGNOSIS — I10 Essential (primary) hypertension: Secondary | ICD-10-CM | POA: Diagnosis not present

## 2015-03-02 DIAGNOSIS — G4730 Sleep apnea, unspecified: Secondary | ICD-10-CM

## 2015-03-02 DIAGNOSIS — E559 Vitamin D deficiency, unspecified: Secondary | ICD-10-CM | POA: Diagnosis not present

## 2015-03-02 DIAGNOSIS — R059 Cough, unspecified: Secondary | ICD-10-CM | POA: Insufficient documentation

## 2015-03-02 DIAGNOSIS — R062 Wheezing: Secondary | ICD-10-CM

## 2015-03-02 DIAGNOSIS — R0989 Other specified symptoms and signs involving the circulatory and respiratory systems: Secondary | ICD-10-CM | POA: Diagnosis not present

## 2015-03-02 DIAGNOSIS — F411 Generalized anxiety disorder: Secondary | ICD-10-CM

## 2015-03-02 LAB — COMPREHENSIVE METABOLIC PANEL
ALK PHOS: 70 U/L (ref 39–117)
ALT: 36 U/L (ref 0–53)
AST: 24 U/L (ref 0–37)
Albumin: 4.3 g/dL (ref 3.5–5.2)
BILIRUBIN TOTAL: 0.7 mg/dL (ref 0.2–1.2)
BUN: 22 mg/dL (ref 6–23)
CO2: 27 mEq/L (ref 19–32)
Calcium: 9.7 mg/dL (ref 8.4–10.5)
Chloride: 101 mEq/L (ref 96–112)
Creatinine, Ser: 0.86 mg/dL (ref 0.40–1.50)
GFR: 99.6 mL/min (ref >60.00)
Glucose, Bld: 120 mg/dL — ABNORMAL HIGH (ref 70–99)
Potassium: 4.7 mEq/L (ref 3.5–5.1)
Sodium: 139 mEq/L (ref 135–145)
Total Protein: 7.1 g/dL (ref 6.0–8.3)

## 2015-03-02 LAB — LIPID PANEL
Cholesterol: 185 mg/dL (ref 0–200)
HDL: 39 mg/dL — AB (ref >39.00)
NONHDL: 146
TRIGLYCERIDES: 288 mg/dL — AB (ref 0.0–149.0)
Total CHOL/HDL Ratio: 5
VLDL: 57.6 mg/dL — AB (ref 0.0–40.0)

## 2015-03-02 LAB — LDL CHOLESTEROL, DIRECT: LDL DIRECT: 101 mg/dL

## 2015-03-02 LAB — VITAMIN D 25 HYDROXY (VIT D DEFICIENCY, FRACTURES): VITD: 23.68 ng/mL — ABNORMAL LOW (ref 30.00–100.00)

## 2015-03-02 LAB — HEMOGLOBIN A1C: Hgb A1c MFr Bld: 6.7 % — ABNORMAL HIGH (ref 4.6–6.5)

## 2015-03-02 LAB — MICROALBUMIN / CREATININE URINE RATIO
Creatinine,U: 151 mg/dL
MICROALB UR: 0.7 mg/dL (ref 0.0–1.9)
MICROALB/CREAT RATIO: 0.5 mg/g (ref 0.0–30.0)

## 2015-03-02 LAB — VITAMIN B12: Vitamin B-12: 344 pg/mL (ref 211–911)

## 2015-03-02 MED ORDER — METFORMIN HCL 500 MG PO TABS
500.0000 mg | ORAL_TABLET | Freq: Every day | ORAL | Status: DC
Start: 2015-03-02 — End: 2015-10-17

## 2015-03-02 MED ORDER — FLUOXETINE HCL 20 MG PO TABS
20.0000 mg | ORAL_TABLET | Freq: Every day | ORAL | Status: DC
Start: 2015-03-02 — End: 2015-09-21

## 2015-03-02 MED ORDER — FLUVASTATIN SODIUM 40 MG PO CAPS: 40.0000 mg | ORAL_CAPSULE | Freq: Every day | ORAL | Status: DC

## 2015-03-02 MED ORDER — VALSARTAN 80 MG PO TABS: 80.0000 mg | ORAL_TABLET | Freq: Every day | ORAL | Status: DC

## 2015-03-02 NOTE — Telephone Encounter (Signed)
pls call pt: Advise CXR indicates acute bronchitis. This is viral infection. No change in instructions.

## 2015-03-02 NOTE — Progress Notes (Signed)
Pre visit review using our clinic review tool, if applicable. No additional management support is needed unless otherwise documented below in the visit note. 

## 2015-03-02 NOTE — Progress Notes (Signed)
Subjective:     AADIL SUR is a 49 y.o. male returns for f/u of HTN, DM2, hyperlipidemia, anxiety, vit d def, GERD. He has new c/o cough. HTN: good control on valsartan. RF: male, DM, fam Hx premature heart disease. No apparent end organ damage. DM2: taking 500 mg metformin. Fairly new onset. Home bs range 98-120. No intol SE from metformin. Will check B12. Hyperlipidemia: taking fluvastatin w/no intol SE.  Anxiety: taking fluoxetine 20 mg w/no intol SE. Feels less "uptight" happy with med effects.  vit d def: check level today. GERD: Taking nexium 40 mg PRN for about 1 mo. No increase in symptoms. Takes about 1-2/mo. new c/o cough: onset 1 week. Wife & grand daughter have been sick. No fever, sore throat, nasal congestion. Cough is productive & has occasional wheeze. No CP w/inspiration. Pt reports he was evaluated for sleep apnea at [facility]. He started using Cpap about 10 days ago.   The following portions of the patient's history were reviewed and updated as appropriate: allergies, current medications, past family history, past medical history, past social history, past surgical history and problem list.  Review of Systems Pertinent items are noted in HPI.    Objective:    BP 131/83 mmHg  Pulse 69  Temp(Src) 98.4 F (36.9 C) (Temporal)  Resp 16  Ht 5\' 8"  (1.727 m)  Wt 180 lb (81.647 kg)  BMI 27.38 kg/m2  SpO2 96% BP 131/83 mmHg  Pulse 69  Temp(Src) 98.4 F (36.9 C) (Temporal)  Resp 16  Ht 5\' 8"  (1.727 m)  Wt 180 lb (81.647 kg)  BMI 27.38 kg/m2  SpO2 96% General appearance: alert, cooperative, appears stated age and no distress Eyes: negative findings: lids and lashes normal, conjunctivae and sclerae normal and wearing glasses Ears: normal TM's and external ear canals both ears Throat: lips, mucosa, and tongue normal; teeth and gums normal Lungs: wheezes R lung fields, BS louder R lung fields, L lung fields clear and . Heart: regular rate and rhythm, S1, S2 normal, no  murmur, click, rub or gallop Neurologic: Grossly normal    Assessment:Plan     1. Type 2 diabetes mellitus without complication Continue metformin, exercise, diet changes - Hemoglobin A1c - Lipid panel - Vitamin B12  2. Vitamin D deficiency - Vit D  25 hydroxy (rtn osteoporosis monitoring)  3. Essential hypertension, benign Continue valsartan - Comprehensive metabolic panel - Microalbumin / creatinine urine ratio  4. Cough Likely viral illness, but will r/o pneumonia - DG Chest 2 View; Future  5. Wheezing - DG Chest 2 View; Future  6. Sleep apnea F/u w/provider  F/u 6 mos-DM, HTN, anxiety, lipids

## 2015-03-02 NOTE — Telephone Encounter (Signed)
LM for pt to CB

## 2015-03-02 NOTE — Telephone Encounter (Signed)
Patient aware.

## 2015-03-02 NOTE — Patient Instructions (Signed)
Continue medicines as directed.  My office will call with lab results and any medication changes.  Start mucinex. Take twice day for about 1 week. Sip fluids every hour for best results. Cough may last 3 weeks. Let us know if you develop chest pain with inspiration or fever. Please get chest xray. My office will call with results.  Stay active. Eat a low sugar diet. Minimize alcohol intake. Eat a plant rich diet for best health.  It has been a pleasure to care for you!

## 2015-03-07 ENCOUNTER — Telehealth: Payer: Self-pay | Admitting: Nurse Practitioner

## 2015-03-07 DIAGNOSIS — E559 Vitamin D deficiency, unspecified: Secondary | ICD-10-CM

## 2015-03-07 MED ORDER — VITAMIN D3 1.25 MG (50000 UT) PO CAPS: 1.0000 | ORAL_CAPSULE | ORAL | Status: DC

## 2015-03-07 NOTE — Telephone Encounter (Signed)
pls call pt: Advise A few concerns regarding labs: vit D is too low. Start prescription D3-script sent. Take 1 capsule weekly for 12 weeks, then have level checked again.   B12 is borderline. Start OTC B complex to get 1200 mcg or 1 MG of B12 daily. Level will be checked with vit D in 3 mos.  Cholesterol panel is about the same-increasing exercise will improve good cholesterol. Cut out sweet foods & beverages, limit alcoholic drinks to 2/daily to decrease triglycerides & best diabetes management. Refer to diet hand out given.  A1c has not changed-still 6.7. Take meds daily. Needs to f/u in 3 mos.

## 2015-03-07 NOTE — Telephone Encounter (Signed)
Called patient and advised of the following: A few concerns regarding labs: vit D is too low. Start prescription D3-script sent. Take 1 capsule weekly for 12 weeks, then have level checked again.   B12 is borderline. Start OTC B complex to get 1200 mcg or 1 MG of B12 daily. Level will be checked with vit D in 3 mos.  Cholesterol panel is about the same-increasing exercise will improve good cholesterol. Cut out sweet foods & beverages, limit alcoholic drinks to 2/daily to decrease triglycerides & best diabetes management. Refer to diet hand out given.  A1c has not changed-still 6.7. Take meds daily. Needs to f/u in 3 mos.

## 2015-05-19 ENCOUNTER — Encounter: Payer: Self-pay | Admitting: Family Medicine

## 2015-05-19 ENCOUNTER — Ambulatory Visit (INDEPENDENT_AMBULATORY_CARE_PROVIDER_SITE_OTHER): Payer: 59 | Admitting: Family Medicine

## 2015-05-19 VITALS — BP 130/78 | HR 65 | Temp 98.0°F | Resp 16 | Wt 178.5 lb

## 2015-05-19 DIAGNOSIS — R319 Hematuria, unspecified: Secondary | ICD-10-CM | POA: Diagnosis not present

## 2015-05-19 DIAGNOSIS — G8929 Other chronic pain: Secondary | ICD-10-CM | POA: Insufficient documentation

## 2015-05-19 DIAGNOSIS — M545 Low back pain, unspecified: Secondary | ICD-10-CM | POA: Insufficient documentation

## 2015-05-19 LAB — POCT URINALYSIS DIPSTICK
Bilirubin, UA: NEGATIVE
GLUCOSE UA: NEGATIVE
KETONES UA: NEGATIVE
Leukocytes, UA: NEGATIVE
Nitrite, UA: NEGATIVE
Protein, UA: NEGATIVE
Urobilinogen, UA: 0.2
pH, UA: 6

## 2015-05-19 MED ORDER — MELOXICAM 15 MG PO TABS: 15.0000 mg | ORAL_TABLET | Freq: Every day | ORAL | Status: DC

## 2015-05-19 NOTE — Progress Notes (Signed)
   Subjective:    Patient ID: Shawn Perez, male    DOB: 1966/06/19, 49 y.o.   MRN: [id number]  HPI Back pain- bilateral low back pain.  sxs started ~3 weeks ago.  Will radiate into buttocks but no radiation of pain down legs.  No weakness or numbness of legs.  No burning w/ urination.  Denies frequency of urination.  No recent change in activity, heavy lifting, long car rides.  Pt has hx of similar on R side and was tx'd for UTI.  Nothing improves pain.  Pain worsens w/ increased water intake.  No worsening of pain w/ movement.  Has not taken any OTC meds for pain relief.   Review of Systems For ROS see HPI     Objective:   Physical Exam  Constitutional: He is oriented to person, place, and time. He appears well-developed and well-nourished. No distress.  HENT:  Head: Normocephalic and atraumatic.  Cardiovascular: Intact distal pulses.   Pulmonary/Chest: Effort normal. No respiratory distress.  Abdominal: Soft. Bowel sounds are normal. He exhibits no distension. There is no tenderness. There is no rebound and no guarding.  Musculoskeletal: He exhibits tenderness (TTP over SI joints bilaterally).  Good forward flexion and extension of back  Neurological: He is alert and oriented to person, place, and time. He has normal reflexes. No cranial nerve deficit. Coordination normal.  (-) SLR bilaterally  Skin: Skin is warm and dry.  Psychiatric: He has a normal mood and affect. His behavior is normal.  Vitals reviewed.         Assessment & Plan:

## 2015-05-19 NOTE — Progress Notes (Signed)
Pre visit review using our clinic review tool, if applicable. No additional management support is needed unless otherwise documented below in the visit note. 

## 2015-05-19 NOTE — Patient Instructions (Signed)
Follow up as needed- particularly if not improving We'll notify you of your urine culture results and treat any infection if present.  If no infection, we'll have you come in and repeat your urine to make sure the small amounts of blood have cleared Increase your water intake Start the Mobic once daily- take w/ food- for pain/inflammation.  Take daily for 5-7 days and then as needed Use a heating pad for pain relief Call with any questions or concerns Hang in there!!!

## 2015-05-20 LAB — URINE CULTURE: Colony Count: 1000

## 2015-05-22 NOTE — Assessment & Plan Note (Signed)
New to provider.  Pt w/ hx of similar.  No sxs of UTI on exam but will send urine for culture as pt had trace blood present on UA.  Pt's pain is localized to SI joints.  Start daily NSAID.  Reviewed supportive care and red flags that should prompt return.  Pt expressed understanding and is in agreement w/ plan.

## 2015-08-19 ENCOUNTER — Other Ambulatory Visit: Payer: Self-pay | Admitting: *Deleted

## 2015-08-19 DIAGNOSIS — E119 Type 2 diabetes mellitus without complications: Secondary | ICD-10-CM

## 2015-08-19 MED ORDER — GLUCOSE BLOOD VI STRP: ORAL_STRIP | Status: DC

## 2015-09-19 ENCOUNTER — Other Ambulatory Visit: Payer: Self-pay | Admitting: *Deleted

## 2015-09-19 DIAGNOSIS — E785 Hyperlipidemia, unspecified: Secondary | ICD-10-CM

## 2015-09-19 MED ORDER — FLUVASTATIN SODIUM 40 MG PO CAPS: 40.0000 mg | ORAL_CAPSULE | Freq: Every day | ORAL | Status: DC

## 2015-09-19 MED FILL — FLUVASTATIN NA 40 MG CAP: 40 | 30 days supply | Qty: 30 | Fill #0

## 2015-09-19 MED FILL — VALSARTAN 80 MG TABLET: 80 | 25 days supply | Qty: 25 | Fill #5

## 2015-09-19 MED FILL — metFORMIN HCL 500 MG TABS: 500 | 30 days supply | Qty: 30 | Fill #4

## 2015-09-19 NOTE — Telephone Encounter (Signed)
Refilled flovastatin for 30 day supply per Rx protocol patient needs office visit prior to anymore refills.

## 2015-09-21 ENCOUNTER — Other Ambulatory Visit: Payer: Self-pay | Admitting: *Deleted

## 2015-09-21 DIAGNOSIS — F411 Generalized anxiety disorder: Secondary | ICD-10-CM

## 2015-09-21 MED ORDER — FLUOXETINE HCL 20 MG PO TABS: 20.0000 mg | ORAL_TABLET | Freq: Every day | ORAL | Status: DC

## 2015-09-21 MED FILL — FLUoxetine HCL 20 MG TABS: 20 | 30 days supply | Qty: 30 | Fill #0

## 2015-09-21 NOTE — Telephone Encounter (Signed)
Refill 30 day supply of prozac sent to pharmacy . Patient needs OV prior to anymore refills.

## 2015-10-17 ENCOUNTER — Encounter: Payer: Self-pay | Admitting: Family Medicine

## 2015-10-17 ENCOUNTER — Ambulatory Visit (INDEPENDENT_AMBULATORY_CARE_PROVIDER_SITE_OTHER): Payer: BLUE CROSS/BLUE SHIELD | Admitting: Family Medicine

## 2015-10-17 VITALS — BP 132/73 | HR 71 | Temp 98.6°F | Resp 20 | Wt 187.5 lb

## 2015-10-17 DIAGNOSIS — I10 Essential (primary) hypertension: Secondary | ICD-10-CM | POA: Diagnosis not present

## 2015-10-17 DIAGNOSIS — F411 Generalized anxiety disorder: Secondary | ICD-10-CM

## 2015-10-17 DIAGNOSIS — F419 Anxiety disorder, unspecified: Secondary | ICD-10-CM

## 2015-10-17 DIAGNOSIS — E119 Type 2 diabetes mellitus without complications: Secondary | ICD-10-CM | POA: Diagnosis not present

## 2015-10-17 DIAGNOSIS — G4730 Sleep apnea, unspecified: Secondary | ICD-10-CM

## 2015-10-17 DIAGNOSIS — E785 Hyperlipidemia, unspecified: Secondary | ICD-10-CM

## 2015-10-17 LAB — POCT GLYCOSYLATED HEMOGLOBIN (HGB A1C): Hemoglobin A1C: 6.8

## 2015-10-17 MED ORDER — VALSARTAN 80 MG PO TABS: 80.0000 mg | ORAL_TABLET | Freq: Every day | ORAL | Status: DC

## 2015-10-17 MED ORDER — FLUVASTATIN SODIUM 40 MG PO CAPS: 40.0000 mg | ORAL_CAPSULE | Freq: Every day | ORAL | Status: DC

## 2015-10-17 MED ORDER — METFORMIN HCL 500 MG PO TABS: 1000.0000 mg | ORAL_TABLET | Freq: Every day | ORAL | Status: DC

## 2015-10-17 MED ORDER — VALSARTAN 80 MG PO TABS
80.0000 mg | ORAL_TABLET | Freq: Every day | ORAL | Status: DC
Start: 2015-10-17 — End: 2016-04-25

## 2015-10-17 MED ORDER — FLUOXETINE HCL 20 MG PO TABS: 20.0000 mg | ORAL_TABLET | Freq: Every day | ORAL | Status: DC

## 2015-10-17 MED FILL — FLUVASTATIN NA 40 MG CAP: 40 | 90 days supply | Qty: 90 | Fill #0

## 2015-10-17 MED FILL — metFORMIN HCL 500 MG TABS: 500 | 90 days supply | Qty: 180 | Fill #0

## 2015-10-17 MED FILL — FLUoxetine HCL 20 MG TABS: 20 | 90 days supply | Qty: 90 | Fill #0

## 2015-10-17 MED FILL — VALSARTAN 80 MG TABLET: 80 | 90 days supply | Qty: 90 | Fill #0

## 2015-10-17 NOTE — Patient Instructions (Signed)
Increase metformin to 1000 mg (2 pills with breakfast). Check fasting blood glucose a few times a week, if consistently above 130 will need to see at 3 months.  Diet and exercise as we discussed today  Basic Carbohydrate Counting for Diabetes Mellitus Carbohydrate counting is a method for keeping track of the amount of carbohydrates you eat. Eating carbohydrates naturally increases the level of sugar (glucose) in your blood, so it is important for you to know the amount that is okay for you to have in every meal. Carbohydrate counting helps keep the level of glucose in your blood within normal limits. The amount of carbohydrates allowed is different for every person. A dietitian can help you calculate the amount that is right for you. Once you know the amount of carbohydrates you can have, you can count the carbohydrates in the foods you want to eat. Carbohydrates are found in the following foods:  Grains, such as breads and cereals.  Dried beans and soy products.  Starchy vegetables, such as potatoes, peas, and corn.  Fruit and fruit juices.  Milk and yogurt.  Sweets and snack foods, such as cake, cookies, candy, chips, soft drinks, and fruit drinks. CARBOHYDRATE COUNTING There are two ways to count the carbohydrates in your food. You can use either of the methods or a combination of both. Reading the "Nutrition Facts" on [place] The "Nutrition Facts" is an area that is included on the labels of almost all packaged food and beverages in the [place]. It includes the serving size of that food or beverage and information about the nutrients in each serving of the food, including the grams (g) of carbohydrate per serving.  Decide the number of servings of this food or beverage that you will be able to eat or drink. Multiply that number of servings by the number of grams of carbohydrate that is listed on the label for that serving. The total will be the amount of carbohydrates you will  be having when you eat or drink this food or beverage. Learning Standard Serving Sizes of Food When you eat food that is not packaged or does not include "Nutrition Facts" on the label, you need to measure the servings in order to count the amount of carbohydrates.A serving of most carbohydrate-rich foods contains about 15 g of carbohydrates. The following list includes serving sizes of carbohydrate-rich foods that provide 15 g ofcarbohydrate per serving:   1 slice of bread (1 oz) or 1 six-inch tortilla.    of a hamburger bun or English muffin.  4-6 crackers.   cup unsweetened dry cereal.    cup hot cereal.   cup rice or pasta.    cup mashed potatoes or  of a large baked potato.  1 cup fresh fruit or one small piece of fruit.    cup canned or frozen fruit or fruit juice.  1 cup milk.   cup plain fat-free yogurt or yogurt sweetened with artificial sweeteners.   cup cooked dried beans or starchy vegetable, such as peas, corn, or potatoes.  Decide the number of standard-size servings that you will eat. Multiply that number of servings by 15 (the grams of carbohydrates in that serving). For example, if you eat 2 cups of strawberries, you will have eaten 2 servings and 30 g of carbohydrates (2 servings x 15 g = 30 g). For foods such as soups and casseroles, in which more than one food is mixed in, you will need to count the carbohydrates  in each food that is included. EXAMPLE OF CARBOHYDRATE COUNTING Sample Dinner  3 oz chicken breast.   cup of brown rice.   cup of corn.  1 cup milk.   1 cup strawberries with sugar-free whipped topping.  Carbohydrate Calculation Step 1: Identify the foods that contain carbohydrates:   Rice.   Corn.   Milk.   Strawberries. Step 2:Calculate the number of servings eaten of each:   2 servings of rice.   1 serving of corn.   1 serving of milk.   1 serving of strawberries. Step 3: Multiply each of those  number of servings by 15 g:   2 servings of rice x 15 g = 30 g.   1 serving of corn x 15 g = 15 g.   1 serving of milk x 15 g = 15 g.   1 serving of strawberries x 15 g = 15 g. Step 4: Add together all of the amounts to find the total grams of carbohydrates eaten: 30 g + 15 g + 15 g + 15 g = 75 g.   This information is not intended to replace advice given to you by your health care provider. Make sure you discuss any questions you have with your health care provider.   Document Released: 08/13/2005 Document Revised: 09/03/2014 Document Reviewed: 07/10/2013 Elsevier Interactive Patient Education [employer].

## 2015-10-17 NOTE — Progress Notes (Signed)
Subjective:    Patient ID: Shawn Perez, male    DOB: 06/15/1966, 50 y.o.   MRN: [id number]  HPI  Patient presents for 6 month follow-up on chronic medical issues  Diabetes: Patient states he was started on 500 mg daily of metformin approximately 6 months ago. At that time his A1c was 6.7. He admits he does not follow a routine exercise regimen. He has been attempting to watch his diet, limiting any type of sugary foods. He does not watch the carbohydrate content of his diet. He does take his blood sugars, although they are not always fasting, the highest his seen is 132. He denies any numbness or tingling of his extremities. He denies any nonhealing wounds of his feet. He is up-to-date with eye exam, but has not had a diabetic eye exam as of yet.   Hypertension: Patient reports compliance with diovan 80 dialy, good control. He denies any side effects to this medication. He denies any chest pain, shortness of breath or lower extremity edema. He is not following a routine exercise regimen, but his getting ready to start one.  He has a history of hyperlipidemia and is on a statin medication. Does not take a daily baby aspirin.  Anxiety: Patient states he has been on 20 mg of Prozac for about a year, and he feels his anxiety is  controlled on this medication. He denies any negative side effects, he is requesting refills today.   Past Medical History  Diagnosis Date  . Heart murmur     since birth  . Hypertension   . Hyperlipidemia 2011  . Sebaceous cyst     axillary  . Temporomandibular jaw dysfunction   . Diabetes mellitus without complication (HCC)    Allergies  Allergen Reactions  . Penicillins Rash    Happened in childhood   Past Surgical History  Procedure Laterality Date  . No past surgeries  07/09/2011    Denies surgical history   Social History   Social History  . Marital Status: Married    Spouse Name: Jeani Hawking  . Number of Children: N/A  . Years of Education: N/A    Occupational History  .     Social History Main Topics  . Smoking status: Former Research scientist (life sciences)  . Smokeless tobacco: Never Used     Comment: quit in 2000 smike 1 ppd  . Alcohol Use: 0.0 oz/week    0 Standard drinks or equivalent per week  . Drug Use: No  . Sexual Activity: Yes   Other Topics Concern  . Not on file   Social History Narrative    Review of Systems Negative, with the exception of above mentioned in HPI     Objective:   Physical Exam BP 132/73 mmHg  Pulse 71  Temp(Src) 98.6 F (37 C)  Resp 20  Wt 187 lb 8 oz (85.049 kg)  SpO2 95% Gen: Afebrile. No acute distress. Nontoxic in appearance, well-developed, well-nourished, Caucasian male. HENT: AT. [place].  MMM.  Eyes:Pupils Equal Round Reactive to light, Extraocular movements intact,  Conjunctiva without redness, discharge or icterus. CV: RRR, no edema Chest: CTAB, no wheeze or crackles Abd: Soft. NTND. BS present.  Neuro: Normal gait. PERLA. EOMi. Alert. Oriented x3 Psych: Normal affect, dress and demeanor. Normal speech. Normal thought content and judgment    Assessment & Plan:  1. Diabetes mellitus without complication ([place]) - POCT HgB A1C--> 6.8. - Discussed the need to increase his exercise to at least 150 minutes weekly.  Start to monitor the carbohydrate as well as sure content in his diet. AVS was provided to him today on carbohydrate counting. - Increased metformin to 1000 mg with breakfast. - Patient was encouraged to monitoring his fasting blood glucose 2-3 times a week and write this down. If consistently above 130 I will need to see him in 3 months, otherwise we will follow-up in 6 months with an A1c prior to rooming - metFORMIN (GLUCOPHAGE) 500 MG tablet; Take 2 tablets (1,000 mg total) by mouth daily with breakfast.  Dispense: 180 tablet; Refill: 1  2. Essential hypertension - Controlled today. Again discussed diet and exercise, low-salt diet. - valsartan (DIOVAN) 80 MG tablet; Take 1 tablet (80 mg  total) by mouth daily.  Dispense: 90 tablet; Refill: 1  3. Generalized anxiety disorder - Controlled on Prozac 20 mg. Refills provided today. - FLUoxetine (PROZAC) 20 MG tablet; Take 1 tablet (20 mg total) by mouth daily.  Dispense: 90 tablet; Refill: 1  4. Hyperlipidemia  - refills on statin medication provided today. Patient will need fasting labs with his appointment in July. - fluvastatin (LESCOL) 40 MG capsule; Take 1 capsule (40 mg total) by mouth daily.  Dispense: 90 capsule; Refill: 1  3 month follow-up if diabetes is above 130 fasting glucose routinely at home. Otherwise 6 month follow-up with fasting labs.

## 2016-01-24 MED FILL — FLUVASTATIN NA 40 MG CAP: 40 | 90 days supply | Qty: 90 | Fill #1

## 2016-01-24 MED FILL — metFORMIN HCL 500 MG TABS: 500 | 90 days supply | Qty: 180 | Fill #1

## 2016-01-24 MED FILL — FLUoxetine HCL 20 MG TABS: 20 | 90 days supply | Qty: 90 | Fill #1

## 2016-01-24 MED FILL — VALSARTAN 80 MG TABLET: 80 | 90 days supply | Qty: 90 | Fill #1

## 2016-04-25 ENCOUNTER — Telehealth: Payer: Self-pay | Admitting: Family Medicine

## 2016-04-25 ENCOUNTER — Other Ambulatory Visit: Payer: Self-pay | Admitting: Family Medicine

## 2016-04-25 DIAGNOSIS — F411 Generalized anxiety disorder: Secondary | ICD-10-CM

## 2016-04-25 DIAGNOSIS — E119 Type 2 diabetes mellitus without complications: Secondary | ICD-10-CM

## 2016-04-25 DIAGNOSIS — E785 Hyperlipidemia, unspecified: Secondary | ICD-10-CM

## 2016-04-25 DIAGNOSIS — I10 Essential (primary) hypertension: Secondary | ICD-10-CM

## 2016-04-25 MED FILL — FLUoxetine HCL 20 MG TABS: 20 | 30 days supply | Qty: 30 | Fill #0

## 2016-04-25 MED FILL — metFORMIN HCL 500 MG TABS: 500 | 30 days supply | Qty: 60 | Fill #0

## 2016-04-25 MED FILL — VALSARTAN 80 MG TABLET: 80 | 30 days supply | Qty: 30 | Fill #0

## 2016-04-25 MED FILL — FLUVASTATIN NA 40 MG CAP: 40 | 30 days supply | Qty: 30 | Fill #0

## 2016-04-25 NOTE — Telephone Encounter (Signed)
I have received multiple prescription request for this patient. He is overdue for follow up on all chronic medical conditions, last seen over 6 months ago.  - please make sure he schedules an appt for conditions, a1c before rooming. - if he is scheduled or schedules we can give him a 1 month script to hold him over until appt. - make certain he is not already scheduled also.

## 2016-04-25 NOTE — Telephone Encounter (Signed)
Scheduled follow up appt. Refills sent for 30 day supply

## 2016-05-02 ENCOUNTER — Encounter: Payer: Self-pay | Admitting: Family Medicine

## 2016-05-02 ENCOUNTER — Ambulatory Visit (INDEPENDENT_AMBULATORY_CARE_PROVIDER_SITE_OTHER): Payer: BLUE CROSS/BLUE SHIELD | Admitting: Family Medicine

## 2016-05-02 VITALS — BP 132/81 | HR 74 | Temp 97.3°F | Resp 20 | Ht 68.0 in | Wt 177.8 lb

## 2016-05-02 DIAGNOSIS — E538 Deficiency of other specified B group vitamins: Secondary | ICD-10-CM

## 2016-05-02 DIAGNOSIS — R202 Paresthesia of skin: Secondary | ICD-10-CM | POA: Insufficient documentation

## 2016-05-02 DIAGNOSIS — E559 Vitamin D deficiency, unspecified: Secondary | ICD-10-CM | POA: Diagnosis not present

## 2016-05-02 DIAGNOSIS — E119 Type 2 diabetes mellitus without complications: Secondary | ICD-10-CM | POA: Diagnosis not present

## 2016-05-02 DIAGNOSIS — I10 Essential (primary) hypertension: Secondary | ICD-10-CM

## 2016-05-02 DIAGNOSIS — Z23 Encounter for immunization: Secondary | ICD-10-CM | POA: Diagnosis not present

## 2016-05-02 DIAGNOSIS — F411 Generalized anxiety disorder: Secondary | ICD-10-CM

## 2016-05-02 DIAGNOSIS — E1165 Type 2 diabetes mellitus with hyperglycemia: Secondary | ICD-10-CM | POA: Insufficient documentation

## 2016-05-02 LAB — BASIC METABOLIC PANEL
BUN: 17 mg/dL (ref 6–23)
CALCIUM: 9.2 mg/dL (ref 8.4–10.5)
CO2: 30 meq/L (ref 19–32)
CREATININE: 0.78 mg/dL (ref 0.40–1.50)
Chloride: 98 mEq/L (ref 96–112)
GFR: 110.97 mL/min (ref >60.00)
GLUCOSE: 111 mg/dL — AB (ref 70–99)
Potassium: 4.7 mEq/L (ref 3.5–5.1)
Sodium: 136 mEq/L (ref 135–145)

## 2016-05-02 LAB — VITAMIN B12: Vitamin B-12: 278 pg/mL (ref 211–911)

## 2016-05-02 LAB — VITAMIN D 25 HYDROXY (VIT D DEFICIENCY, FRACTURES): VITD: 35.21 ng/mL (ref 30.00–100.00)

## 2016-05-02 LAB — POCT GLYCOSYLATED HEMOGLOBIN (HGB A1C): Hemoglobin A1C: 6.5

## 2016-05-02 MED ORDER — METFORMIN HCL 500 MG PO TABS: 1000.0000 mg | ORAL_TABLET | Freq: Every day | ORAL | 1 refills | Status: DC

## 2016-05-02 MED ORDER — FLUOXETINE HCL 20 MG PO TABS: 20.0000 mg | ORAL_TABLET | Freq: Every day | ORAL | 1 refills | Status: DC

## 2016-05-02 MED ORDER — VALSARTAN 80 MG PO TABS: 80.0000 mg | ORAL_TABLET | Freq: Every day | ORAL | 1 refills | Status: DC

## 2016-05-02 NOTE — Patient Instructions (Signed)
Please have your diabetic eye exam completed and have notes sent to use.  I will call you with your lab results once available.  Continue metformin 1000 mg daily (can take together), and exercise > 150 minutes a week.  Watch carbohydrate and sugary meals.   Follow up in 4 months on diabetes and Hypertension.

## 2016-05-02 NOTE — Progress Notes (Signed)
Subjective:    Patient ID: Shawn Perez, male    DOB: Jan 23, 1966, 50 y.o.   MRN: [id number]  HPI  Patient presents for 6 month follow-up on chronic medical issues  Diabetes: Patient  Minutes to compliance with metformin 500 mg in the morning, he states he sometimes forgets the evening dose 500 mg. He admits he does not follow a routine exercise regimen. He has been attempting to watch his diet, limiting any type of sugary/carb foods.  He does take his blood sugars, although they are not always fasting, 108-130. He admits to intermittent  Numbness/tingling of his bilateral index/thumb tips, which is new for him. He denies any nonhealing wounds of his feet. He is over-due for his eye exam.   Hypertension: Patient reports compliance with diovan 80 daily, good control. He denies any side effects to this medication. He denies any chest pain, shortness of breath or lower extremity edema. He is not following a routine exercise regimen.  He has a history of hyperlipidemia and is on a statin medication.   Anxiety: Patient states he has been on 20 mg of Prozac  Since 2015. He feels his anxiety is  controlled on this medication. He denies any negative side effects, he is requesting refills today.   Past Medical History:  Diagnosis Date  . Diabetes mellitus without complication ([place])   . Heart murmur    since birth  . Hyperlipidemia 2011  . Hypertension   . Sebaceous cyst    axillary  . Temporomandibular jaw dysfunction    Allergies  Allergen Reactions  . Fenofibrate Rash  . Penicillins Rash    Happened in childhood   Past Surgical History:  Procedure Laterality Date  . NO PAST SURGERIES  07/09/2011   Denies surgical history   Social History   Social History  . Marital status: Married    Spouse name: Jeani Hawking  . Number of children: N/A  . Years of education: N/A   Occupational History  .  [employer]   Social History Main Topics  . Smoking status: Former Research scientist (life sciences)  . Smokeless  tobacco: Never Used     Comment: quit in 2000 smike 1 ppd  . Alcohol use 0.0 oz/week  . Drug use: No  . Sexual activity: Yes   Other Topics Concern  . Not on file   Social History Narrative  . No narrative on file    Review of Systems Negative, with the exception of above mentioned in HPI     Objective:   Physical Exam BP 132/81 (BP Location: Left Arm, Patient Position: Sitting, Cuff Size: Large)   Pulse 74   Temp 97.3 F (36.3 C) (Oral)   Resp 20   Ht 5\' 8"  (1.727 m)   Wt 177 lb 12.8 oz (80.6 kg)   SpO2 97%   BMI 27.03 kg/m  Gen: Afebrile. No acute distress. Nontoxic in appearance, well-developed, well-nourished, Caucasian male. HENT: AT. [place].  MMM.  Eyes:Pupils Equal Round Reactive to light, Extraocular movements intact,  Conjunctiva without redness, discharge or icterus. CV: RRR, no edema Chest: CTAB, no wheeze or crackles Abd: Soft. NTND. BS present.  Neuro: Normal gait. PERLA. EOMi. Alert. Oriented x3.  Bilateral hand sensation intact , negative Tinel's at wrist and elbow. Neg Phalen's. NV intact distally.  Psych: Normal affect, dress and demeanor. Normal speech. Normal thought content and judgment  Diabetic Foot Exam - Simple   Simple Foot Form Diabetic Foot exam was performed with the following  findings:  Yes 05/02/2016 10:05 AM  Visual Inspection No deformities, no ulcerations, no other skin breakdown bilaterally:  Yes Sensation Testing Intact to touch and monofilament testing bilaterally:  Yes Pulse Check Posterior Tibialis and Dorsalis pulse intact bilaterally:  Yes Comments Mildly elongated nails.         Assessment & Plan:  Diabetes mellitus without complication (HCC) - POCT HgB A1C--> 6.8--> 6.3  today - Discussed the need to increase his exercise to at least 150 minutes weekly.  - Patient was encouraged to monitoring his fasting blood glucose 2-3 times a week and write this down. - metFORMIN (GLUCOPHAGE) 500 MG tablet; Take 2 tablets (1,000 mg  total) by mouth daily with breakfast.  Dispense: 180 tablet; Refill: 1 - Opth: over 1 year since last appointment , he will make an appt by next DM f/u and have notes sent to  [place] office. He does not recall the doctors name (located in [place]). - Foot exam: 05/02/2016, discussed diabetic foot care.  - Imm: prevanar 13 today, PSSV23 04/2017 - On an ARB.  - bmp  Yearly collected today. - 4 month  Follow-up  Essential hypertension - Controlled today. Discussed diet and exercise, low-salt diet. - valsartan (DIOVAN) 80 MG tablet; Take 1 tablet (80 mg total) by mouth daily.  Dispense: 90 tablet; Refill: 1 -  BMP yearly, collected today. -  Follow-up 4-6 months  Generalized anxiety disorder - Controlled on Prozac 20 mg. Refills provided today. - FLUoxetine (PROZAC) 20 MG tablet; Take 1 tablet (20 mg total) by mouth daily.  Dispense: 90 tablet; Refill: 1 -  Follow-up 4-6 months  numbness/tingling upper extremities/b12 deficiency/Vit D deficiency: -  New problem -  Patient with a history of diabetes, B-12 deficiency, vitamin D deficiency and anxiety.  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 -  Discussed home treatment for carpal tunnel with stretches , if symptoms are worsening patient will need to make an appointment for further studies. - B12 and vitamin D levels elected today.  > 25 minutes spent with patient, >50% of time spent face to face counseling patient and coordinating care.   Electronically Signed by: Howard Pouch, DO [facility] [place]

## 2016-05-03 ENCOUNTER — Telehealth: Payer: Self-pay | Admitting: Family Medicine

## 2016-05-03 DIAGNOSIS — E538 Deficiency of other specified B group vitamins: Secondary | ICD-10-CM

## 2016-05-03 MED ORDER — VITAMIN B-12 1000 MCG PO TABS: ORAL_TABLET | ORAL | 0 refills | Status: DC

## 2016-05-03 MED FILL — VITAMIN B-12 1,000 MCG TABL: 1000 | 130 days supply | Qty: 130 | Fill #0

## 2016-05-03 NOTE — Telephone Encounter (Signed)
Please call pt: - his kidney function loks good by labs.  - his vit d is low normal and his B12 is very low.  - I have prescribed B12 regimen, once completed he should take a daily OTC b12.  - He should also be encouraged to take a daily OTC Vit D 800u daily.

## 2016-05-03 NOTE — Telephone Encounter (Signed)
Spoke with patient reviewed lab results and instructions. Patient verbalized understanding. 

## 2016-05-25 ENCOUNTER — Other Ambulatory Visit: Payer: Self-pay | Admitting: Family Medicine

## 2016-05-25 DIAGNOSIS — E785 Hyperlipidemia, unspecified: Secondary | ICD-10-CM

## 2016-05-25 MED ORDER — FLUVASTATIN SODIUM 40 MG PO CAPS: 40.0000 mg | ORAL_CAPSULE | Freq: Every day | ORAL | 0 refills | Status: DC

## 2016-05-25 MED FILL — metFORMIN HCL 500 MG TABS: 500 | 90 days supply | Qty: 180 | Fill #0

## 2016-05-25 MED FILL — FLUoxetine HCL 20 MG TABS: 20 | 90 days supply | Qty: 90 | Fill #0

## 2016-05-25 MED FILL — FLUVASTATIN NA 40 MG CAP: 40 | 90 days supply | Qty: 90 | Fill #0

## 2016-05-25 MED FILL — VALSARTAN 80 MG TABLET: 80 | 90 days supply | Qty: 90 | Fill #0

## 2016-05-25 NOTE — Progress Notes (Signed)
Please call pt: - I have received a refill request for his cholesterol medication. I have refilled this for him, but we will need to collect fasting labs on his next appt since it has been almost 2 years since cholesterol check.

## 2016-05-29 ENCOUNTER — Other Ambulatory Visit: Payer: Self-pay | Admitting: Family Medicine

## 2016-05-29 DIAGNOSIS — E785 Hyperlipidemia, unspecified: Secondary | ICD-10-CM

## 2016-08-29 ENCOUNTER — Other Ambulatory Visit: Payer: Self-pay | Admitting: Family Medicine

## 2016-08-29 DIAGNOSIS — E785 Hyperlipidemia, unspecified: Secondary | ICD-10-CM

## 2016-08-29 MED FILL — FLUoxetine HCL 20 MG TABS: 20 | 30 days supply | Qty: 30 | Fill #1

## 2016-08-29 MED FILL — VALSARTAN 80 MG TABLET: 80 | 30 days supply | Qty: 30 | Fill #1

## 2016-08-30 MED FILL — FLUVASTATIN NA 40 MG CAP: 40 | 30 days supply | Qty: 30 | Fill #0

## 2016-09-03 ENCOUNTER — Ambulatory Visit: Payer: BLUE CROSS/BLUE SHIELD | Admitting: Family Medicine

## 2016-09-24 ENCOUNTER — Ambulatory Visit (INDEPENDENT_AMBULATORY_CARE_PROVIDER_SITE_OTHER): Payer: 59 | Admitting: Family Medicine

## 2016-09-24 ENCOUNTER — Encounter: Payer: Self-pay | Admitting: Family Medicine

## 2016-09-24 ENCOUNTER — Other Ambulatory Visit: Payer: Self-pay | Admitting: Family Medicine

## 2016-09-24 VITALS — BP 133/87 | HR 77 | Temp 98.4°F | Resp 20 | Wt 186.8 lb

## 2016-09-24 DIAGNOSIS — E538 Deficiency of other specified B group vitamins: Secondary | ICD-10-CM

## 2016-09-24 DIAGNOSIS — J101 Influenza due to other identified influenza virus with other respiratory manifestations: Secondary | ICD-10-CM

## 2016-09-24 DIAGNOSIS — J209 Acute bronchitis, unspecified: Secondary | ICD-10-CM

## 2016-09-24 LAB — POCT INFLUENZA A/B
INFLUENZA A, POC: POSITIVE — AB
Influenza B, POC: NEGATIVE

## 2016-09-24 MED ORDER — AZITHROMYCIN 250 MG PO TABS: ORAL_TABLET | ORAL | 0 refills | Status: DC

## 2016-09-24 MED ORDER — OSELTAMIVIR PHOSPHATE 75 MG PO CAPS: 75.0000 mg | ORAL_CAPSULE | Freq: Two times a day (BID) | ORAL | 0 refills | Status: DC

## 2016-09-24 MED FILL — VITAMIN B-12 1,000 MCG TABL: 1000 | 130 days supply | Qty: 130 | Fill #0

## 2016-09-24 MED FILL — OSELTAMIVIR PHOS 75 MG CAP: 75 | 5 days supply | Qty: 10 | Fill #0

## 2016-09-24 MED FILL — AZITHROMYCIN 250 MG TABLET: 250 | 5 days supply | Qty: 6 | Fill #0

## 2016-09-24 NOTE — Progress Notes (Signed)
Shawn Perez , 01/01/1966, 51 y.o., male MRN: [id number] Patient Care Team    Relationship Specialty Notifications Start End  Ma Hillock, DO PCP - General Family Medicine  04/25/16     CC: cough Subjective: Pt presents for an acute OV with complaints of cough  of 2 days duration.  Associated symptoms include cough, fever (103F), chills, body aches.   He had declined his flu shot this year. Pt has tried tylenol/advil to ease their symptoms. He is eating and drinking alright.   Allergies  Allergen Reactions  . Fenofibrate Rash  . Penicillins Rash    Happened in childhood   Social History  Substance Use Topics  . Smoking status: Former Research scientist (life sciences)  . Smokeless tobacco: Never Used     Comment: quit in 2000 smike 1 ppd  . Alcohol use 0.0 oz/week   Past Medical History:  Diagnosis Date  . Diabetes mellitus without complication ([place])   . Heart murmur    since birth  . Hyperlipidemia 2011  . Hypertension   . Sebaceous cyst    axillary  . Temporomandibular jaw dysfunction    Past Surgical History:  Procedure Laterality Date  . NO PAST SURGERIES  07/09/2011   Denies surgical history   Family History  Problem Relation Age of Onset  . Hypertension Mother   . Diabetes Mother   . Early death Mother   . Heart disease Mother   . Heart disease Sister   . Early death Brother   . Heart disease Brother   . Hyperlipidemia Brother   . Hypertension Brother   . Early death Maternal Aunt   . Heart disease Maternal Aunt   . Early death Maternal Uncle   . Heart disease Maternal Uncle   . Heart disease Maternal Grandmother   . Early death Maternal Grandmother   . Heart disease Brother    Allergies as of 09/24/2016      Reactions   Fenofibrate Rash   Penicillins Rash   Happened in childhood      Medication List       Accurate as of 09/24/16  8:54 AM. Always use your most recent med list.          FLUoxetine 20 MG tablet Commonly known as:  PROZAC Take 1 tablet (20 mg  total) by mouth daily.   fluvastatin 40 MG capsule Commonly known as:  LESCOL TAKE 1 CAPSULE (40 MG TOTAL) BY MOUTH DAILY.   glucose blood test strip Commonly known as:  ONE TOUCH ULTRA TEST Test 1 time daily.   latanoprost 0.005 % ophthalmic solution Commonly known as:  XALATAN Place 1 drop into both eyes at bedtime.   metFORMIN 500 MG tablet Commonly known as:  GLUCOPHAGE Take 2 tablets (1,000 mg total) by mouth daily with breakfast.   ONE TOUCH ULTRA SYSTEM KIT w/Device Kit 1 kit by Does not apply route once.   onetouch ultrasoft lancets Use as instructed   valsartan 80 MG tablet Commonly known as:  DIOVAN Take 1 tablet (80 mg total) by mouth daily.   vitamin B-12 1000 MCG tablet Commonly known as:  CYANOCOBALAMIN 1000 mcg daily for 7 days and then 1000 mcg weekly       No results found for this or any previous visit (from the past 24 hour(s)). No results found.   ROS: Negative, with the exception of above mentioned in HPI   Objective:  BP 133/87 (BP Location: Left Arm, Patient Position: Sitting,  Cuff Size: Large)   Pulse 77   Temp 98.4 F (36.9 C)   Resp 20   Wt 186 lb 12 oz (84.7 kg)   SpO2 97%   BMI 28.40 kg/m  Body mass index is 28.4 kg/m. Gen: Afebrile. No acute distress. Nontoxic in appearance, well developed, well nourished.  HENT: AT. . Bilateral TM visualized WNL. MMM, no oral lesions. Bilateral nares with erythema, dariange. Throat without erythema or exudates. Cough and hoarseness present.  Eyes:Pupils Equal Round Reactive to light, Extraocular movements intact,  Conjunctiva without redness, discharge or icterus. Neck/lymp/endocrine: Supple,no lymphadenopathy CV: RRR  Chest: CTAB, no wheeze or crackles. Good air movement, normal resp effort.  Abd: Soft.. NTND. BS present.   Results for orders placed or performed in visit on 09/24/16 (from the past 24 hour(s))  POCT Influenza A/B     Status: Abnormal   Collection Time: 09/24/16  9:19 AM    Result Value Ref Range   Influenza A, POC Positive (A) Negative   Influenza B, POC Negative Negative     Assessment/Plan: Shawn Perez is a 51 y.o. male present for acute OV for  Viral upper respiratory illness Acute bronchitis, unspecified organism - rest, hydrate.  - Flu positive today - Mucinex, advil/tylenol.  - Z-pack for bronchitis like sx in diabetic.   - F/U PRN   electronically signed by:  Howard Pouch, DO  [place]

## 2016-09-24 NOTE — Patient Instructions (Addendum)
Your flu test was Positive.  Treating you for bronchitis and flu.  est, hydrate. Use plain Mucinex or Mucinex DM.  Start Z-pack for bronchitis symptom.    Please help [place] help you:  It is a privilege to be able to take care of great patients such as yourself. We are honored you have chosen [facility] for your Primary Care home. Below you will find basic instructions that you may need to access in the future. Please help [place] help you by reading the instructions, which cover many of the frequent questions we experience.   Prescription refills and request:  -In order to allow more efficient response time, please call your pharmacy for all refills. They will forward the request electronically to [place]. This allows for the quickest possible response. Request left on a nurse line can take longer to refill, since these are checked as time allows between office patients and other phone calls.  - refill request can take up to 3-5 working days to complete.  - If request is sent electronically and request is appropiate, it is usually completed in 1-2 business days.  - all patients will need to be seen routinely for all chronic medical conditions requiring prescription medications (see follow-up below). If you are overdue for follow up on your condition, you will be asked to make an appointment and we will call in enough medication to cover you until your appointment (up to 30 days).  - all controlled substances will require a face to face visit to request/refill.  - if you desire your prescriptions to go through a new pharmacy, and have an active script at original pharmacy, you will need to call your pharmacy and have scripts transferred to new pharmacy. This is completed between the pharmacy locations and not by your provider.    Results: If any images or labs were ordered, it can take up to 1 week to get results depending on the test ordered and the lab/facility running and resulting the test. - Normal  or stable results, which do not need further discussion, will be released to your mychart immediately with attached note to you. A call will not be generated for normal results. Please make certain to sign up for mychart. If you have questions on how to activate your mychart you can call the front office.  - If your results need further discussion, our office will attempt to contact you via phone, and if unable to reach you after 2 attempts, we will release your abnormal result to your mychart with instructions.  - All results will be automatically released in mychart after 1 week.  - Your provider will provide you with explanation and instruction on all relevant material in your results. Please keep in mind, results and labs may appear confusing or abnormal to the untrained eye, but it does not mean they are actually abnormal for you personally. If you have any questions about your results that are not covered, or you desire more detailed explanation than what was provided, you should make an appointment with your provider to do so.   Our office handles many outgoing and incoming calls daily. If we have not contacted you within 1 week about your results, please check your mychart to see if there is a message first and if not, then contact our office.  In helping with this matter, you help decrease call volume, and therefore allow [place] to be able to respond to patients needs more efficiently.   Acute  office visits (sick visit):  An acute visit is intended for a new problem and are scheduled in shorter time slots to allow schedule openings for patients with new problems. This is the appropriate visit to discuss a new problem. In order to provide you with excellent quality medical care with proper time for you to explain your problem, have an exam and receive treatment with instructions, these appointments should be limited to one new problem per visit. If you experience a new problem, in which you desire to be  addressed, please make an acute office visit, we save openings on the schedule to accommodate you. Please do not save your new problem for any other type of visit, let us take care of it properly and quickly for you.   Follow up visits:  Depending on your condition(s) your provider will need to see you routinely in order to provide you with quality care and prescribe medication(s). Most chronic conditions (Example: hypertension, Diabetes, depression/anxiety... etc), require visits a couple times a year. Your provider will instruct you on proper follow up for your personal medical conditions and history. Please make certain to make follow up appointments for your condition as instructed. Failing to do so could result in lapse in your medication treatment/refills. If you request a refill, and are overdue to be seen on a condition, we will always provide you with a 30 day script (once) to allow you time to schedule.    Medicare wellness (well visit): - we have a wonderful Nurse Maudie Mercury), that will meet with you and provide you will yearly medicare wellness visits. These visits should occur yearly (can not be scheduled less than 1 calendar year apart) and cover preventive health, immunizations, advance directives and screenings you are entitled to yearly through your medicare benefits. Do not miss out on your entitled benefits, this is when medicare will pay for these benefits to be ordered for you.  These are strongly encouraged by your provider and is the appropriate type of visit to make certain you are up to date with all preventive health benefits. If you have not had your medicare wellness exam in the last 12 months, please make certain to schedule one by calling the office and schedule your medicare wellness with Maudie Mercury as soon as possible.   Yearly physical (well visit):  - Adults are recommended to be seen yearly for physicals. Check with your insurance and date of your last physical, most insurances  require one calendar year between physicals. Physicals include all preventive health topics, screenings, medical exam and labs that are appropriate for gender/age and history. You may have fasting labs needed at this visit. This is a well visit (not a sick visit), acute topics should not be covered during this visit.  - Pediatric patients are seen more frequently when they are younger. Your provider will advise you on well child visit timing that is appropriate for your their age. - This is not a medicare wellness visit. Medicare wellness exams do not have an exam portion to the visit. Some medicare companies allow for a physical, some do not allow a yearly physical. If your medicare allows a yearly physical you can schedule the medicare wellness with our nurse Maudie Mercury and have your physical with your provider after, on the same day. Please check with insurance for your full benefits.   Late Policy/No Shows:  - all new patients should arrive 15-30 minutes earlier than appointment to allow [place] time  to  obtain all personal  demographics,  insurance information and for you to complete office paperwork. - All established patients should arrive 10-15 minutes earlier than appointment time to update all information and be checked in .  - In our best efforts to run on time, if you are late for your appointment you will be asked to either reschedule or if able, we will work you back into the schedule. There will be a wait time to work you back in the schedule,  depending on availability.  - If you are unable to make it to your appointment as scheduled, please call 24 hours ahead of time to allow [place] to fill the time slot with someone else who needs to be seen. If you do not cancel your appointment ahead of time, you may be charged a no show fee.   Marland Kitchen

## 2016-10-04 ENCOUNTER — Other Ambulatory Visit: Payer: Self-pay | Admitting: Family Medicine

## 2016-10-04 ENCOUNTER — Telehealth: Payer: Self-pay | Admitting: Family Medicine

## 2016-10-04 ENCOUNTER — Ambulatory Visit (INDEPENDENT_AMBULATORY_CARE_PROVIDER_SITE_OTHER): Payer: 59 | Admitting: Family Medicine

## 2016-10-04 ENCOUNTER — Encounter: Payer: Self-pay | Admitting: Family Medicine

## 2016-10-04 VITALS — BP 130/81 | HR 57 | Temp 97.5°F | Resp 20 | Ht 68.0 in | Wt 184.8 lb

## 2016-10-04 DIAGNOSIS — E119 Type 2 diabetes mellitus without complications: Secondary | ICD-10-CM | POA: Diagnosis not present

## 2016-10-04 DIAGNOSIS — I10 Essential (primary) hypertension: Secondary | ICD-10-CM

## 2016-10-04 DIAGNOSIS — E785 Hyperlipidemia, unspecified: Secondary | ICD-10-CM

## 2016-10-04 DIAGNOSIS — F411 Generalized anxiety disorder: Secondary | ICD-10-CM | POA: Diagnosis not present

## 2016-10-04 LAB — POCT GLYCOSYLATED HEMOGLOBIN (HGB A1C): HEMOGLOBIN A1C: 6.6

## 2016-10-04 MED ORDER — FLUOXETINE HCL 20 MG PO TABS: 20.0000 mg | ORAL_TABLET | Freq: Every day | ORAL | 1 refills | Status: DC

## 2016-10-04 MED ORDER — METFORMIN HCL 500 MG PO TABS: 1000.0000 mg | ORAL_TABLET | Freq: Every day | ORAL | 1 refills | Status: DC

## 2016-10-04 MED ORDER — VALSARTAN 80 MG PO TABS: 80.0000 mg | ORAL_TABLET | Freq: Every day | ORAL | 1 refills | Status: DC

## 2016-10-04 MED ORDER — FLUVASTATIN SODIUM 40 MG PO CAPS: 40.0000 mg | ORAL_CAPSULE | Freq: Every day | ORAL | 0 refills | Status: DC

## 2016-10-04 MED FILL — metFORMIN HCL 500 MG TABS: 500 | 30 days supply | Qty: 60 | Fill #0

## 2016-10-04 MED FILL — FLUoxetine HCL 20 MG CAPS: 20 | 30 days supply | Qty: 30 | Fill #0

## 2016-10-04 MED FILL — VALSARTAN 80 MG TABLET: 80 | 30 days supply | Qty: 30 | Fill #0

## 2016-10-04 MED FILL — FLUVASTATIN NA 40 MG CAP: 40 | 30 days supply | Qty: 30 | Fill #0

## 2016-10-04 NOTE — Telephone Encounter (Signed)
Pharmacy is calling to change prozac script to capsule form. This will save the patient about $30. Please give them a call

## 2016-10-04 NOTE — Telephone Encounter (Signed)
Pharmacy notified that they can change Prozac from tablet to capsule.

## 2016-10-04 NOTE — Progress Notes (Signed)
Subjective:    Patient ID: Shawn Perez, male    DOB: [birth year], 51 y.o.   MRN: [id number]  Diabetes    Patient presents for 6 month follow-up on chronic medical issues  Diabetes: Pt reports compliance with 1000 mg QD. Denies numbness, tingling of extremities, hypo/hyperglycemic events or non-healing wounds. Pt reports BG ranges 130-140.  PNA series: prevnar 04/2016, pneumovax due 04/2017 Flu shot: declines (recommneded yearly) BMP: normal, yearly check.  Foot exam: 04/2016 completed Eye exam: overdue, [facility] in [place] A1c: 6.8--> 6.5--> 6.6   Hypertension/hyperlipidemia: Pt reports compliance with diovan 80 QD. Blood pressures ranges at home normal. Patient denies chest pain, shortness of breath or lower extremity edema. Pt takes a daily baby ASA. Pt is is prescribed statin.   Anxiety: Patient states he has been on 20 mg of Prozac  Since 2015. He reports his condition is stable, without medication side effects and he is in need of refills.   Past Medical History:  Diagnosis Date  . Diabetes mellitus without complication ([facility])   . Heart murmur    since birth  . Hyperlipidemia 2011  . Hypertension   . Sebaceous cyst    axillary  . Temporomandibular jaw dysfunction    Allergies  Allergen Reactions  . Fenofibrate Rash  . Penicillins Rash    Happened in childhood   Past Surgical History:  Procedure Laterality Date  . NO PAST SURGERIES  07/09/2011   Denies surgical history   Social History   Social History  . Marital status: Married    Spouse name: Jeani Hawking  . Number of children: N/A  . Years of education: N/A   Occupational History  .  [employer]   Social History Main Topics  . Smoking status: Former Research scientist (life sciences)  . Smokeless tobacco: Never Used     Comment: quit in 2000 smike 1 ppd  . Alcohol use 0.0 oz/week  . Drug use: No  . Sexual activity: Yes   Other Topics Concern  . Not on file   Social History Narrative  . No narrative on file    Review  of Systems Negative, with the exception of above mentioned in HPI     Objective:   Physical Exam BP 130/81 (BP Location: Right Arm, Patient Position: Sitting, Cuff Size: Large)   Pulse (!) 57   Temp 97.5 F (36.4 C)   Resp 20   Ht 5\' 8"  (1.727 m)   Wt 184 lb 12 oz (83.8 kg)   SpO2 97%   BMI 28.09 kg/m  Gen: Afebrile. No acute distress. pleasant caucasian male.  HENT: AT. [place].  MMM. Eyes:Pupils Equal Round Reactive to light, Extraocular movements intact,  Conjunctiva without redness, discharge or icterus. Neck/lymp/endocrine: Supple,no lymphadenopathy, no thyromegaly CV: RRR, noedema, +2/4 P posterior tibialis pulses Chest: CTAB, no wheeze or crackles Abd: Soft. NTND. BS present.  Skin: no rashes, purpura or petechiae.  Neuro:  Normal gait. PERLA. EOMi. Alert. Oriented.  Psych: Normal affect, dress and demeanor. Normal speech. Normal thought content and judgment..      Assessment & Plan:   MANUAL BROAS is a 51 y.o. present for DM/HTN follow up.  Diabetes mellitus without complication ([facility]) - stable, metformin 1000 mg QD refilled.  PNA series: prevnar 04/2016, pneumovax due 04/2017 Flu shot: declines (recommneded yearly) BMP: normal, yearly check. 04/2016 WNL Foot exam: 04/2016 completed Eye exam: overdue, [facility] in [place], he will call today to make an appt.  A1c: 6.8--> 6.5-->  6.6 - 4 month  Follow-up  Essential hypertension - Controlled today. Discussed diet and exercise, low-salt diet. - valsartan (DIOVAN) 80 MG tablet; Take 1 tablet (80 mg total) by mouth daily.  Dispense: 90 tablet; Refill: 1 -  BMP yearly, collected today. -  Follow-up 4-6 months  Generalized anxiety disorder - Controlled on Prozac 20 mg. Refills provided today. - FLUoxetine (PROZAC) 20 MG tablet; Take 1 tablet (20 mg total) by mouth daily.  Dispense: 90 tablet; Refill: 1 -  Follow-up 4-6 months    > 25 minutes spent with patient, >50% of time spent face to face counseling patient and  coordinating care.   Electronically Signed by: Howard Pouch, DO [facility]

## 2016-10-04 NOTE — Patient Instructions (Signed)
Great to see you today! Your a1c is 6.8 today. It is ok. Keep up the exercise and diet modification.   F/U 4 months.    Please help [place] help you:  We are honored you have chosen [facility] for your Primary Care home. Below you will find basic instructions that you may need to access in the future. Please help [place] help you by reading the instructions, which cover many of the frequent questions we experience.   Prescription refills and request:  -In order to allow more efficient response time, please call your pharmacy for all refills. They will forward the request electronically to [place]. This allows for the quickest possible response. Request left on a nurse line can take longer to refill, since these are checked as time allows between office patients and other phone calls.  - refill request can take up to 3-5 working days to complete.  - If request is sent electronically and request is appropiate, it is usually completed in 1-2 business days.  - all patients will need to be seen routinely for all chronic medical conditions requiring prescription medications (see follow-up below). If you are overdue for follow up on your condition, you will be asked to make an appointment and we will call in enough medication to cover you until your appointment (up to 30 days).  - all controlled substances will require a face to face visit to request/refill.  - if you desire your prescriptions to go through a new pharmacy, and have an active script at original pharmacy, you will need to call your pharmacy and have scripts transferred to new pharmacy. This is completed between the pharmacy locations and not by your provider.    Results: If any images or labs were ordered, it can take up to 1 week to get results depending on the test ordered and the lab/facility running and resulting the test. - Normal or stable results, which do not need further discussion, will be released to your mychart immediately with  attached note to you. A call will not be generated for normal results. Please make certain to sign up for mychart. If you have questions on how to activate your mychart you can call the front office.  - If your results need further discussion, our office will attempt to contact you via phone, and if unable to reach you after 2 attempts, we will release your abnormal result to your mychart with instructions.  - All results will be automatically released in mychart after 1 week.  - Your provider will provide you with explanation and instruction on all relevant material in your results. Please keep in mind, results and labs may appear confusing or abnormal to the untrained eye, but it does not mean they are actually abnormal for you personally. If you have any questions about your results that are not covered, or you desire more detailed explanation than what was provided, you should make an appointment with your provider to do so.   Our office handles many outgoing and incoming calls daily. If we have not contacted you within 1 week about your results, please check your mychart to see if there is a message first and if not, then contact our office.  In helping with this matter, you help decrease call volume, and therefore allow [place] to be able to respond to patients needs more efficiently.   Acute office visits (sick visit):  An acute visit is intended for a new problem and are scheduled in  shorter time slots to allow schedule openings for patients with new problems. This is the appropriate visit to discuss a new problem. In order to provide you with excellent quality medical care with proper time for you to explain your problem, have an exam and receive treatment with instructions, these appointments should be limited to one new problem per visit. If you experience a new problem, in which you desire to be addressed, please make an acute office visit, we save openings on the schedule to accommodate you. Please  do not save your new problem for any other type of visit, let us take care of it properly and quickly for you.   Follow up visits:  Depending on your condition(s) your provider will need to see you routinely in order to provide you with quality care and prescribe medication(s). Most chronic conditions (Example: hypertension, Diabetes, depression/anxiety... etc), require visits a couple times a year. Your provider will instruct you on proper follow up for your personal medical conditions and history. Please make certain to make follow up appointments for your condition as instructed. Failing to do so could result in lapse in your medication treatment/refills. If you request a refill, and are overdue to be seen on a condition, we will always provide you with a 30 day script (once) to allow you time to schedule.    Medicare wellness (well visit): - we have a wonderful Nurse Maudie Mercury), that will meet with you and provide you will yearly medicare wellness visits. These visits should occur yearly (can not be scheduled less than 1 calendar year apart) and cover preventive health, immunizations, advance directives and screenings you are entitled to yearly through your medicare benefits. Do not miss out on your entitled benefits, this is when medicare will pay for these benefits to be ordered for you.  These are strongly encouraged by your provider and is the appropriate type of visit to make certain you are up to date with all preventive health benefits. If you have not had your medicare wellness exam in the last 12 months, please make certain to schedule one by calling the office and schedule your medicare wellness with Maudie Mercury as soon as possible.   Yearly physical (well visit):  - Adults are recommended to be seen yearly for physicals. Check with your insurance and date of your last physical, most insurances require one calendar year between physicals. Physicals include all preventive health topics, screenings, medical  exam and labs that are appropriate for gender/age and history. You may have fasting labs needed at this visit. This is a well visit (not a sick visit), acute topics should not be covered during this visit.  - Pediatric patients are seen more frequently when they are younger. Your provider will advise you on well child visit timing that is appropriate for your their age. - This is not a medicare wellness visit. Medicare wellness exams do not have an exam portion to the visit. Some medicare companies allow for a physical, some do not allow a yearly physical. If your medicare allows a yearly physical you can schedule the medicare wellness with our nurse Maudie Mercury and have your physical with your provider after, on the same day. Please check with insurance for your full benefits.   Late Policy/No Shows:  - all new patients should arrive 15-30 minutes earlier than appointment to allow [place] time  to  obtain all personal demographics,  insurance information and for you to complete office paperwork. - All established patients should arrive 10-15  minutes earlier than appointment time to update all information and be checked in .  - In our best efforts to run on time, if you are late for your appointment you will be asked to either reschedule or if able, we will work you back into the schedule. There will be a wait time to work you back in the schedule,  depending on availability.  - If you are unable to make it to your appointment as scheduled, please call 24 hours ahead of time to allow [place] to fill the time slot with someone else who needs to be seen. If you do not cancel your appointment ahead of time, you may be charged a no show fee.

## 2016-10-05 MED FILL — ONE TOUCH ULTRA TEST STRIPS: 25 days supply | Qty: 25 | Fill #0

## 2016-11-01 ENCOUNTER — Telehealth: Payer: Self-pay | Admitting: Family Medicine

## 2016-11-01 DIAGNOSIS — H2513 Age-related nuclear cataract, bilateral: Secondary | ICD-10-CM | POA: Diagnosis not present

## 2016-11-01 DIAGNOSIS — E119 Type 2 diabetes mellitus without complications: Secondary | ICD-10-CM | POA: Diagnosis not present

## 2016-11-01 DIAGNOSIS — H401131 Primary open-angle glaucoma, bilateral, mild stage: Secondary | ICD-10-CM | POA: Diagnosis not present

## 2016-11-01 LAB — HM DIABETES EYE EXAM

## 2016-11-01 MED FILL — LATANOPROST 0.005% EYE DRP: 0.005 | 25 days supply | Qty: 3 | Fill #0

## 2016-11-01 NOTE — Telephone Encounter (Signed)
Patient called to advise that his insurance needs medical substantiation for the use of his cpap in order to continue to get supplies. The company he uses is [employer] - [phone]

## 2016-11-02 NOTE — Telephone Encounter (Signed)
Pt is still established with a provider for his sleep apnea, they should be able to verify all his needs and reorder supplies etc.

## 2016-11-02 NOTE — Telephone Encounter (Signed)
Left message for patient to contact his specialist for sleep apnea to get this information .

## 2016-11-06 MED FILL — FLUVASTATIN NA 40 MG CAP: 40 | 30 days supply | Qty: 30 | Fill #1

## 2016-11-06 MED FILL — VALSARTAN 80 MG TABLET: 80 | 30 days supply | Qty: 30 | Fill #1

## 2016-11-06 MED FILL — metFORMIN HCL 500 MG TABS: 500 | 30 days supply | Qty: 60 | Fill #1

## 2016-11-07 ENCOUNTER — Encounter: Payer: Self-pay | Admitting: *Deleted

## 2016-11-08 MED FILL — FLUoxetine HCL 20 MG CAPS: 20 | 30 days supply | Qty: 30 | Fill #1

## 2016-12-03 DIAGNOSIS — G4733 Obstructive sleep apnea (adult) (pediatric): Secondary | ICD-10-CM | POA: Diagnosis not present

## 2016-12-03 DIAGNOSIS — I10 Essential (primary) hypertension: Secondary | ICD-10-CM | POA: Diagnosis not present

## 2016-12-10 MED FILL — FLUVASTATIN NA 40 MG CAP: 40 | 30 days supply | Qty: 30 | Fill #2

## 2016-12-10 MED FILL — metFORMIN HCL 500 MG TABS: 500 | 30 days supply | Qty: 60 | Fill #2

## 2016-12-10 MED FILL — VALSARTAN 80 MG TABLET: 80 | 30 days supply | Qty: 30 | Fill #2

## 2016-12-10 MED FILL — FLUoxetine HCL 20 MG CAPS: 20 | 30 days supply | Qty: 30 | Fill #2

## 2017-01-11 MED FILL — FLUVASTATIN NA 40 MG CAP: 40 | 30 days supply | Qty: 30 | Fill #1

## 2017-01-11 MED FILL — VALSARTAN 80 MG TABLET: 80 | 30 days supply | Qty: 30 | Fill #3

## 2017-01-11 MED FILL — metFORMIN HCL 500 MG TABS: 500 | 30 days supply | Qty: 60 | Fill #3

## 2017-01-11 MED FILL — FLUoxetine HCL 20 MG CAPS: 20 | 30 days supply | Qty: 30 | Fill #3

## 2017-01-24 MED FILL — ONE TOUCH ULTRA TEST STRIPS: 25 days supply | Qty: 25 | Fill #1

## 2017-02-11 MED FILL — VALSARTAN 80 MG TABLET: 80 | 27 days supply | Qty: 27 | Fill #4

## 2017-02-11 MED FILL — metFORMIN HCL 500 MG TABS: 500 | 27 days supply | Qty: 54 | Fill #4

## 2017-02-11 MED FILL — FLUoxetine HCL 20 MG CAPS: 20 | 27 days supply | Qty: 27 | Fill #4

## 2017-02-18 MED FILL — FLUVASTATIN NA 40 MG CAP: 40 | 30 days supply | Qty: 30 | Fill #2

## 2017-03-08 MED FILL — VALSARTAN 80 MG TABLET: 80 | 30 days supply | Qty: 30 | Fill #5

## 2017-03-08 MED FILL — FLUoxetine HCL 20 MG CAPS: 20 | 30 days supply | Qty: 30 | Fill #5

## 2017-03-08 MED FILL — metFORMIN HCL 500 MG TABS: 500 | 30 days supply | Qty: 60 | Fill #5

## 2017-03-15 ENCOUNTER — Other Ambulatory Visit: Payer: Self-pay | Admitting: Family Medicine

## 2017-03-15 DIAGNOSIS — E785 Hyperlipidemia, unspecified: Secondary | ICD-10-CM

## 2017-03-18 MED FILL — FLUVASTATIN NA 40 MG CAP: 40 | 30 days supply | Qty: 30 | Fill #0

## 2017-04-01 MED FILL — LATANOPROST 0.005% EYE DRP: 0.005 | 25 days supply | Qty: 3 | Fill #1

## 2017-04-02 ENCOUNTER — Other Ambulatory Visit: Payer: Self-pay | Admitting: Family Medicine

## 2017-04-02 DIAGNOSIS — E119 Type 2 diabetes mellitus without complications: Secondary | ICD-10-CM

## 2017-04-02 DIAGNOSIS — F411 Generalized anxiety disorder: Secondary | ICD-10-CM

## 2017-04-02 DIAGNOSIS — I10 Essential (primary) hypertension: Secondary | ICD-10-CM

## 2017-04-03 MED FILL — VALSARTAN 80 MG TABLET: 80 | 30 days supply | Qty: 30 | Fill #0

## 2017-04-03 MED FILL — FLUoxetine HCL 20 MG CAPS: 20 | 30 days supply | Qty: 30 | Fill #0

## 2017-04-08 ENCOUNTER — Other Ambulatory Visit: Payer: Self-pay | Admitting: Family Medicine

## 2017-04-08 DIAGNOSIS — E785 Hyperlipidemia, unspecified: Secondary | ICD-10-CM

## 2017-04-11 ENCOUNTER — Encounter: Payer: Self-pay | Admitting: Family Medicine

## 2017-04-11 ENCOUNTER — Ambulatory Visit (INDEPENDENT_AMBULATORY_CARE_PROVIDER_SITE_OTHER): Payer: 59 | Admitting: Family Medicine

## 2017-04-11 VITALS — BP 135/85 | HR 53 | Temp 97.6°F | Resp 20 | Ht 68.0 in | Wt 191.0 lb

## 2017-04-11 DIAGNOSIS — I10 Essential (primary) hypertension: Secondary | ICD-10-CM

## 2017-04-11 DIAGNOSIS — E119 Type 2 diabetes mellitus without complications: Secondary | ICD-10-CM

## 2017-04-11 DIAGNOSIS — F411 Generalized anxiety disorder: Secondary | ICD-10-CM

## 2017-04-11 DIAGNOSIS — E785 Hyperlipidemia, unspecified: Secondary | ICD-10-CM | POA: Diagnosis not present

## 2017-04-11 LAB — CBC
HCT: 45.3 % (ref 39.0–52.0)
Hemoglobin: 15 g/dL (ref 13.0–17.0)
MCHC: 33.1 g/dL (ref 30.0–36.0)
MCV: 97.4 fl (ref 78.0–100.0)
Platelets: 237 10*3/uL (ref 150.0–400.0)
RBC: 4.65 Mil/uL (ref 4.22–5.81)
RDW: 13.2 % (ref 11.5–15.5)
WBC: 4.9 10*3/uL (ref 4.0–10.5)

## 2017-04-11 LAB — POCT GLYCOSYLATED HEMOGLOBIN (HGB A1C): HEMOGLOBIN A1C: 6.9

## 2017-04-11 MED ORDER — FLUOXETINE HCL 20 MG PO CAPS: 20.0000 mg | ORAL_CAPSULE | Freq: Every day | ORAL | 1 refills | Status: DC

## 2017-04-11 MED ORDER — METFORMIN HCL 500 MG PO TABS: ORAL_TABLET | ORAL | 1 refills | Status: DC

## 2017-04-11 MED ORDER — VALSARTAN 80 MG PO TABS: 80.0000 mg | ORAL_TABLET | Freq: Every day | ORAL | 0 refills | Status: DC

## 2017-04-11 MED ORDER — FLUVASTATIN SODIUM 40 MG PO CAPS: 40.0000 mg | ORAL_CAPSULE | Freq: Every day | ORAL | 1 refills | Status: DC

## 2017-04-11 MED FILL — metFORMIN HCL 500 MG TABS: 500 | 30 days supply | Qty: 90 | Fill #0

## 2017-04-11 MED FILL — FLUVASTATIN NA 40 MG CAP: 40 | 30 days supply | Qty: 30 | Fill #0

## 2017-04-11 NOTE — Progress Notes (Signed)
Shawn Perez , [birth year], 51 y.o., male MRN: [id number] Patient Care Team    Relationship Specialty Notifications Start End  Ma Hillock, DO PCP - General Family Medicine  04/25/16     Chief Complaint  Patient presents with  . Hypertension  . Diabetes     Subjective:  Diabetes: Pt reports compliance with Metformin but was only taking 500 mg twice a day. Patient denies dizziness, hyperglycemic or hypoglycemic events. Patient denies dizziness, hyperglycemic or hypoglycemic events. Patient denies numbness, tingling in the extremities or nonhealing wounds of feet.  PNA series: prevnar 04/2016, pneumovax due 04/2017 Flu shot: declines (recommneded yearly) BMP: normal, yearly check.  Foot exam: 04/2016 completed Eye exam: overdue, [facility] in [place] A1c: 6.8--> 6.5--> 6.6--> 6.9 today   Hypertension/hyperlipidemia: Pt reports compliance with diovan 80 QD. Blood pressures ranges at home normal. Patient denies chest pain, shortness of breath or lower extremity edema. Pt takes a daily baby ASA. Pt is  prescribed statin.   Anxiety: Patient states he has been on 20 mg of Prozac  Since 2015. He reports his condition is stable, without medication side effects and he is in need of refills.   Depression screen [facility] 2/9 04/11/2017  Decreased Interest 0  Down, Depressed, Hopeless 0  PHQ - 2 Score 0    Allergies  Allergen Reactions  . Fenofibrate Rash  . Penicillins Rash    Happened in childhood   Social History  Substance Use Topics  . Smoking status: Former Research scientist (life sciences)  . Smokeless tobacco: Never Used     Comment: quit in 2000 smike 1 ppd  . Alcohol use 0.0 oz/week   Past Medical History:  Diagnosis Date  . Diabetes mellitus without complication ([place])   . Heart murmur    since birth  . Hyperlipidemia 2011  . Hypertension   . Sebaceous cyst    axillary  . Temporomandibular jaw dysfunction    Past Surgical History:  Procedure Laterality Date  . NO PAST SURGERIES   07/09/2011   Denies surgical history   Family History  Problem Relation Age of Onset  . Hypertension Mother   . Diabetes Mother   . Early death Mother   . Heart disease Mother   . Heart disease Sister   . Early death Brother   . Heart disease Brother   . Hyperlipidemia Brother   . Hypertension Brother   . Early death Maternal Aunt   . Heart disease Maternal Aunt   . Early death Maternal Uncle   . Heart disease Maternal Uncle   . Heart disease Maternal Grandmother   . Early death Maternal Grandmother   . Heart disease Brother    Allergies as of 04/11/2017      Reactions   Fenofibrate Rash   Penicillins Rash   Happened in childhood      Medication List       Accurate as of 04/11/17  8:21 AM. Always use your most recent med list.          FLUoxetine 20 MG capsule Commonly known as:  PROZAC Take 1 capsule (20 mg total) by mouth daily. Needs office visit prior to anymore refills   fluvastatin 40 MG capsule Commonly known as:  LESCOL TAKE 1 CAPSULE (40 MG TOTAL) BY MOUTH DAILY. PATIENT NEEDS OFFICE VISIT PRIOR TO ANYMORE REFILLS.   latanoprost 0.005 % ophthalmic solution Commonly known as:  XALATAN Place 1 drop into both eyes at bedtime.   metFORMIN 500 MG  tablet Commonly known as:  GLUCOPHAGE Take 2 tablets (1,000 mg total) by mouth daily with breakfast. Needs office visit prior to anymore refills   ONE TOUCH ULTRA SYSTEM KIT w/Device Kit 1 kit by Does not apply route once.   ONE TOUCH ULTRA TEST test strip Generic drug:  glucose blood TEST 1 TIME DAILY.   onetouch ultrasoft lancets Use as instructed   valsartan 80 MG tablet Commonly known as:  DIOVAN Take 1 tablet (80 mg total) by mouth daily. Needs office visit prior to anymore refills   vitamin B-12 1000 MCG tablet Commonly known as:  CYANOCOBALAMIN TAKE 1 TABLET BY MOUTH DAILY FOR SEVEN DAYS, THEN 1 TABLET WEEKLY THERAFTER       All past medical history, surgical history, allergies, family  history, immunizations andmedications were updated in the EMR today and reviewed under the history and medication portions of their EMR.     ROS: Negative, with the exception of above mentioned in HPI   Objective:  BP 135/85 (BP Location: Right Arm, Patient Position: Sitting, Cuff Size: Large)   Pulse (!) 53   Temp 97.6 F (36.4 C)   Resp 20   Ht _0  (1.727 m)   Wt 191 lb (86.6 kg)   SpO2 97%   BMI 29.04 kg/m  Body mass index is 29.04 kg/m. Gen: Afebrile. No acute distress. Nontoxic in appearance, well developed, well nourished.  HENT: AT. [place].  MMM, no oral lesions.  Eyes:Pupils Equal Round Reactive to light, Extraocular movements intact,  Conjunctiva without redness, discharge or icterus. CV: RRR no murmur, no edema Chest: CTAB, no wheeze or crackles. Good air movement, normal resp effort.  Abd: Soft. NTND. BS present.  Skin: Warm, well perfused, intact. Neuro: Normal gait. PERLA. EOMi. Alert. Oriented x3  Psych: Normal affect, dress and demeanor. Normal speech. Normal thought content and judgment.  No exam data present No results found. Results for orders placed or performed in visit on 04/11/17 (from the past 24 hour(s))  POCT glycosylated hemoglobin (Hb A1C)     Status: Abnormal   Collection Time: 04/11/17  8:17 AM  Result Value Ref Range   Hemoglobin A1C 6.9     Assessment/Plan: Shawn Perez is a 51 y.o. male present for OV for  Diabetes mellitus without complication (HCC) - Stable but mildly increasing A1c today up to 6.9. Patient was incorrectly taking his metformin. He has been having some mild GI side effects. Advised him to increase his metformin to 1000 mg in the morning and 500 mg in the afternoon. If he still has GI side effects after a couple weeks of getting used to the new doses could attempt to prescribe extended release. PNA series: prevnar 04/2016, pneumovax next visit.  Flu shot: declines (recommneded yearly) BMP: collected today Foot exam: 04/2016  completed Eye exam: 11/01/2016- retinopathy present A1c: 6.8--> 6.5--> 6.6--> 6.9 - 34 month  Follow-up  Essential hypertension - Controlled today. Discussed diet and exercise, low-salt diet. - Refilled: Patient has not been contacted by his pharmacy stating that his medication was affected by the recall. valsartan (DIOVAN) 80 MG tablet; Take 1 tablet (80 mg total) by mouth daily.  Dispense: 90 tablet; Refill: 1 -  CBC, BMP collected today -  Follow-up 4  Generalized anxiety disorder - Stable today Controlled on Prozac 20 mg. Refills provided today. - FLUoxetine (PROZAC) 20 MG tablet; Take 1 tablet (20 mg total) by mouth daily.  Dispense: 90 tablet; Refill: 1 -  Follow-up 4  Reviewed expectations re: course of current medical issues.  Discussed self-management of symptoms.  Outlined signs and symptoms indicating need for more acute intervention.  Patient verbalized understanding and all questions were answered.  Patient received an After-Visit Summary.    Orders Placed This Encounter  Procedures  . POCT glycosylated hemoglobin (Hb A1C)     Note is dictated utilizing voice recognition software. Although note has been proof read prior to signing, occasional typographical errors still can be missed. If any questions arise, please do not hesitate to call for verification.   electronically signed by:  Howard Pouch, DO  [place]

## 2017-04-11 NOTE — Patient Instructions (Signed)
It was great to see you today.  Take metofrmin twice  Day. Start by taking 1 tab twice a day, then in a week increase to 2 tabs in the morning and 1 tab in the evening (with a meal). Your A1c is higher than your normal at 6.9. Your Bp is ok. Watch your sodium intake.  See you in 4 months.

## 2017-04-12 ENCOUNTER — Telehealth: Payer: Self-pay | Admitting: Family Medicine

## 2017-04-12 LAB — BASIC METABOLIC PANEL WITH GFR
BUN: 17 mg/dL (ref 7–25)
CHLORIDE: 101 mmol/L (ref 98–110)
CO2: 24 mmol/L (ref 20–32)
CREATININE: 0.84 mg/dL (ref 0.70–1.33)
Calcium: 9.4 mg/dL (ref 8.6–10.3)
GFR, Est Non African American: >89 mL/min (ref >=60)
GLUCOSE: 186 mg/dL — AB (ref 65–99)
POTASSIUM: 4.7 mmol/L (ref 3.5–5.3)
Sodium: 138 mmol/L (ref 135–146)

## 2017-04-12 NOTE — Telephone Encounter (Signed)
Please call pt:  labs are stable.

## 2017-04-15 NOTE — Telephone Encounter (Signed)
Spoke with patient reviewed lab results. 

## 2017-05-01 MED FILL — ONE TOUCH ULTRA TEST STRIPS: 25 days supply | Qty: 25 | Fill #2

## 2017-05-10 MED FILL — FLUVASTATIN NA 40 MG CAP: 40 | 30 days supply | Qty: 30 | Fill #1

## 2017-05-10 MED FILL — VALSARTAN 80 MG TABLET: 80 | 30 days supply | Qty: 30 | Fill #0

## 2017-05-10 MED FILL — FLUoxetine HCL 20 MG CAPS: 20 | 30 days supply | Qty: 30 | Fill #0

## 2017-05-22 DIAGNOSIS — H2513 Age-related nuclear cataract, bilateral: Secondary | ICD-10-CM | POA: Diagnosis not present

## 2017-05-22 DIAGNOSIS — H401131 Primary open-angle glaucoma, bilateral, mild stage: Secondary | ICD-10-CM | POA: Diagnosis not present

## 2017-05-22 MED FILL — LUMIGAN 0.01% EYE DROPS: 0.01 | 25 days supply | Qty: 3 | Fill #0

## 2017-05-27 MED FILL — ONE TOUCH ULTRA TEST STRIPS: 25 days supply | Qty: 25 | Fill #3

## 2017-06-18 ENCOUNTER — Other Ambulatory Visit: Payer: Self-pay | Admitting: Family Medicine

## 2017-06-18 DIAGNOSIS — I10 Essential (primary) hypertension: Secondary | ICD-10-CM

## 2017-06-18 MED FILL — VALSARTAN 80 MG TABLET: 80 | 30 days supply | Qty: 30 | Fill #0

## 2017-06-18 MED FILL — FLUoxetine HCL 20 MG CAPS: 20 | 30 days supply | Qty: 30 | Fill #1

## 2017-06-18 MED FILL — metFORMIN HCL 500 MG TABS: 500 | 30 days supply | Qty: 90 | Fill #1

## 2017-06-18 MED FILL — FLUVASTATIN NA 40 MG CAP: 40 | 30 days supply | Qty: 30 | Fill #2

## 2017-06-28 MED FILL — ONE TOUCH ULTRA TEST STRIPS: 25 days supply | Qty: 25 | Fill #4

## 2017-07-17 MED FILL — metFORMIN HCL 500 MG TABS: 500 | 30 days supply | Qty: 90 | Fill #2

## 2017-07-17 MED FILL — FLUoxetine HCL 20 MG CAPS: 20 | 30 days supply | Qty: 30 | Fill #2

## 2017-07-17 MED FILL — VALSARTAN 80 MG TABLET: 80 | 30 days supply | Qty: 30 | Fill #1

## 2017-07-17 MED FILL — FLUVASTATIN NA 40 MG CAP: 40 | 30 days supply | Qty: 30 | Fill #3

## 2017-08-08 ENCOUNTER — Encounter: Payer: Self-pay | Admitting: Family Medicine

## 2017-08-08 ENCOUNTER — Ambulatory Visit: Payer: 59 | Admitting: Family Medicine

## 2017-08-08 VITALS — BP 117/72 | HR 61 | Temp 97.8°F | Wt 183.0 lb

## 2017-08-08 DIAGNOSIS — E663 Overweight: Secondary | ICD-10-CM | POA: Diagnosis not present

## 2017-08-08 DIAGNOSIS — E785 Hyperlipidemia, unspecified: Secondary | ICD-10-CM

## 2017-08-08 DIAGNOSIS — F411 Generalized anxiety disorder: Secondary | ICD-10-CM

## 2017-08-08 DIAGNOSIS — E119 Type 2 diabetes mellitus without complications: Secondary | ICD-10-CM | POA: Diagnosis not present

## 2017-08-08 DIAGNOSIS — I10 Essential (primary) hypertension: Secondary | ICD-10-CM | POA: Diagnosis not present

## 2017-08-08 DIAGNOSIS — Z23 Encounter for immunization: Secondary | ICD-10-CM | POA: Diagnosis not present

## 2017-08-08 DIAGNOSIS — F419 Anxiety disorder, unspecified: Secondary | ICD-10-CM | POA: Diagnosis not present

## 2017-08-08 LAB — POCT GLYCOSYLATED HEMOGLOBIN (HGB A1C): Hemoglobin A1C: 6.4

## 2017-08-08 MED ORDER — FLUVASTATIN SODIUM 40 MG PO CAPS: 40.0000 mg | ORAL_CAPSULE | Freq: Every day | ORAL | 1 refills | Status: DC

## 2017-08-08 MED ORDER — FLUOXETINE HCL 20 MG PO CAPS: 20.0000 mg | ORAL_CAPSULE | Freq: Every day | ORAL | 1 refills | Status: DC

## 2017-08-08 MED ORDER — VALSARTAN 80 MG PO TABS: 80.0000 mg | ORAL_TABLET | Freq: Every day | ORAL | 1 refills | Status: DC

## 2017-08-08 MED ORDER — METFORMIN HCL 500 MG PO TABS: 1000.0000 mg | ORAL_TABLET | Freq: Every day | ORAL | 1 refills | Status: DC

## 2017-08-08 NOTE — Progress Notes (Signed)
Shawn Perez , 13-Jun-1966, 51 y.o., male MRN: [id number] Patient Care Team    Relationship Specialty Notifications Start End  Ma Hillock, DO PCP - General Family Medicine  04/25/16     Chief Complaint  Patient presents with  . Hypertension    6 months f/u of his DM and HTN  . Diabetes     Subjective:  Diabetes: Pt reports compliance with Metformin 1000 mg a day. Denies numbness, tingling of extremities, hypo/hyperglycemic events or non-healing wounds. Pt reports BG ranges are nor routinely checked at home.  PNA series: prevnar 04/2016, pneumovax completed today 08/08/2017 Flu shot: declines (recommneded yearly) BMP: norma 8/16/2018l, yearly check.  Foot exam: 04/2016 completed--> pt declined today (has on winter boots, denies issues). Will complete next time.  Eye exam: 11/11/2016 UTD A1c: 6.8--> 6.5--> 6.6--> 6.9 --> 6.4 today  Hypertension/hyperlipidemia: Pt reports compliance with diovan 80 QD. Blood pressures ranges at home normal. Patient denies chest pain, shortness of breath, dizziness or lower extremity edema. Pt takes a daily baby ASA. Pt is  prescribed statin. - BMP 03/2017 WNL - CBC 03/2017 WNL - Lipids: 03/02/2015 elevated triglycerides. --> fasting labs next appt.  - On ARB.   Anxiety: Patient states he has been on 20 mg of Prozac  Since 2015. He reports his condition is stable today and he is doing well on medication.   Depression screen [facility] 2/9 04/11/2017  Decreased Interest 0  Down, Depressed, Hopeless 0  PHQ - 2 Score 0    Allergies  Allergen Reactions  . Fenofibrate Rash  . Penicillins Rash    Happened in childhood   Social History   Tobacco Use  . Smoking status: Former Research scientist (life sciences)  . Smokeless tobacco: Never Used  . Tobacco comment: quit in 2000 smike 1 ppd  Substance Use Topics  . Alcohol use: Yes    Alcohol/week: 0.0 oz   Past Medical History:  Diagnosis Date  . Diabetes mellitus without complication ([place])   . Heart murmur    since birth    . Hyperlipidemia 2011  . Hypertension   . Sebaceous cyst    axillary  . Temporomandibular jaw dysfunction    Past Surgical History:  Procedure Laterality Date  . NO PAST SURGERIES  07/09/2011   Denies surgical history   Family History  Problem Relation Age of Onset  . Hypertension Mother   . Diabetes Mother   . Early death Mother   . Heart disease Mother   . Heart disease Sister   . Early death Brother   . Heart disease Brother   . Hyperlipidemia Brother   . Hypertension Brother   . Early death Maternal Aunt   . Heart disease Maternal Aunt   . Early death Maternal Uncle   . Heart disease Maternal Uncle   . Heart disease Maternal Grandmother   . Early death Maternal Grandmother   . Heart disease Brother    Allergies as of 08/08/2017      Reactions   Fenofibrate Rash   Penicillins Rash   Happened in childhood      Medication List        Accurate as of 08/08/17 11:38 AM. Always use your most recent med list.          FLUoxetine 20 MG capsule Commonly known as:  PROZAC Take 1 capsule (20 mg total) by mouth daily. Needs office visit prior to anymore refills   fluvastatin 40 MG capsule Commonly known as:  LESCOL Take 1 capsule (40 mg total) by mouth daily. Patient needs office visit prior to anymore refills.   latanoprost 0.005 % ophthalmic solution Commonly known as:  XALATAN Place 1 drop into both eyes at bedtime.   metFORMIN 500 MG tablet Commonly known as:  GLUCOPHAGE Take 2 tablets (1,000 mg total) by mouth daily with breakfast.   ONE TOUCH ULTRA SYSTEM KIT w/Device Kit 1 kit by Does not apply route once.   ONE TOUCH ULTRA TEST test strip Generic drug:  glucose blood TEST 1 TIME DAILY.   onetouch ultrasoft lancets Use as instructed   valsartan 80 MG tablet Commonly known as:  DIOVAN Take 1 tablet (80 mg total) by mouth daily.   vitamin B-12 1000 MCG tablet Commonly known as:  CYANOCOBALAMIN TAKE 1 TABLET BY MOUTH DAILY FOR SEVEN DAYS, THEN  1 TABLET WEEKLY THERAFTER       All past medical history, surgical history, allergies, family history, immunizations andmedications were updated in the EMR today and reviewed under the history and medication portions of their EMR.     ROS: Negative, with the exception of above mentioned in HPI   Objective:  BP 117/72 (BP Location: Right Arm, Patient Position: Sitting, Cuff Size: Large)   Pulse 61   Temp 97.8 F (36.6 C)   Wt 183 lb (83 kg)   SpO2 99%   BMI 27.83 kg/m  Body mass index is 27.83 kg/m. Gen: Afebrile. No acute distress. Nontoxic in appearance, well developed, well nourished. caucasian male.  HENT: AT. [place]. MMM.  Eyes:Pupils Equal Round Reactive to light, Extraocular movements intact,  Conjunctiva without redness, discharge or icterus. CV: RRR no murmur, no edema, +2/4 P posterior tibialis pulses Chest: CTAB, no wheeze or crackles Abd: Soft. NTND. BS present.  Skin: no rashes, purpura or petechiae.  Neuro: Normal gait. PERLA. EOMi. Alert. Oriented x3  Psych: Normal affect, dress and demeanor. Normal speech. Normal thought content and judgment.   No exam data present No results found. Results for orders placed or performed in visit on 08/08/17 (from the past 24 hour(s))  POCT glycosylated hemoglobin (Hb A1C)     Status: Abnormal   Collection Time: 08/08/17  8:12 AM  Result Value Ref Range   Hemoglobin A1C 6.4     Assessment/Plan: Shawn Perez is a 51 y.o. male present for OV for  Diabetes mellitus without complication (HCC)/overweight - Stable today. A1c has improved and is at goal.  - Continue metformin 1000 mg daily. Refills provided today.  PNA series: prevnar 04/2016, pneumovax completed today 08/08/2017 Flu shot: declines (recommneded yearly) BMP: Normal, collected 03/2017 Foot exam: 04/2016 completed--> patient will have she is off next visit. 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 Follow yearly. A1c: 6.8--> 6.5--> 6.6--> 6.9--> 6.4 - 4 month   Follow-up  Essential hypertension/hyperlipidemia - Well-controlled today. Discussed diet and exercise, low-salt diet. - Refilled:valsartan (DIOVAN) 80 MG tablet; Take 1 tablet (80 mg total) by mouth daily.  Dispense: 90 tablet; Refill: 1 - BMP 03/2017 WNL - CBC 03/2017 WNL - Lipids: 03/02/2015 elevated triglycerides. --> fasting labs next appt.  - Continue baby aspirin - Continue fluvastatin 40 mg QD -  Follow-up 4  Generalized anxiety disorder - Stable. Refills provided on Prilosec 20 mg daily.  - FLUoxetine (PROZAC) 20 MG tablet; Take 1 tablet (20 mg total) by mouth daily.  Dispense: 90 tablet; Refill: 1 -  Follow-up 4   Reviewed expectations re: course of current medical issues.  Discussed self-management of  symptoms.  Outlined signs and symptoms indicating need for more acute intervention.  Patient verbalized understanding and all questions were answered.  Patient received an After-Visit Summary.    Orders Placed This Encounter  Procedures  . Pneumococcal polysaccharide vaccine 23-valent greater than or equal to 2yo subcutaneous/IM  . POCT glycosylated hemoglobin (Hb A1C)     Note is dictated utilizing voice recognition software. Although note has been proof read prior to signing, occasional typographical errors still can be missed. If any questions arise, please do not hesitate to call for verification.   electronically signed by:  Howard Pouch, DO  [place]

## 2017-08-08 NOTE — Patient Instructions (Signed)
Diabetes and Hypertension is well controlled. Good Job! No changes to medications today. Refills provided on all the medications.  Follow up 4 months.    Happy Holidays!!!

## 2017-08-09 MED FILL — FLUVASTATIN NA 40 MG CAP: 40 | 30 days supply | Qty: 30 | Fill #0

## 2017-08-09 MED FILL — VALSARTAN 80 MG TABLET: 80 | 30 days supply | Qty: 30 | Fill #0

## 2017-08-09 MED FILL — FLUoxetine HCL 20 MG CAPS: 20 | 30 days supply | Qty: 30 | Fill #0

## 2017-08-09 MED FILL — metFORMIN HCL 500 MG TABS: 500 | 30 days supply | Qty: 60 | Fill #0

## 2017-09-11 MED FILL — ONE TOUCH ULTRA TEST STRIPS: 25 days supply | Qty: 25 | Fill #5

## 2017-09-24 MED FILL — FLUVASTATIN NA 40 MG CAP: 40 | 30 days supply | Qty: 30 | Fill #1

## 2017-09-24 MED FILL — VALSARTAN 80 MG TABLET: 80 | 30 days supply | Qty: 30 | Fill #1

## 2017-09-24 MED FILL — FLUoxetine HCL 20 MG CAPS: 20 | 30 days supply | Qty: 30 | Fill #1

## 2017-09-27 ENCOUNTER — Encounter: Payer: 59 | Admitting: Internal Medicine

## 2017-09-30 ENCOUNTER — Ambulatory Visit: Payer: 59 | Admitting: Family Medicine

## 2017-09-30 ENCOUNTER — Encounter: Payer: Self-pay | Admitting: Family Medicine

## 2017-09-30 VITALS — BP 124/72 | HR 78 | Temp 98.3°F | Ht 68.0 in | Wt 189.1 lb

## 2017-09-30 DIAGNOSIS — E119 Type 2 diabetes mellitus without complications: Secondary | ICD-10-CM

## 2017-09-30 DIAGNOSIS — M545 Low back pain: Secondary | ICD-10-CM

## 2017-09-30 DIAGNOSIS — G8929 Other chronic pain: Secondary | ICD-10-CM | POA: Diagnosis not present

## 2017-09-30 DIAGNOSIS — M7061 Trochanteric bursitis, right hip: Secondary | ICD-10-CM | POA: Diagnosis not present

## 2017-09-30 LAB — MICROALBUMIN / CREATININE URINE RATIO
Creatinine,U: 101.3 mg/dL
MICROALB UR: 1.8 mg/dL (ref 0.0–1.9)
MICROALB/CREAT RATIO: 1.7 mg/g (ref 0.0–30.0)

## 2017-09-30 NOTE — Progress Notes (Signed)
Pre visit review using our clinic review tool, if applicable. No additional management support is needed unless otherwise documented below in the visit note. 

## 2017-09-30 NOTE — Patient Instructions (Signed)
Ice/cold pack over area for 10-15 min every 2-3 hours while awake.  Heat (pad or rice pillow in microwave) over affected area, 10-15 minutes every 2-3 hours while awake.   OK to take Tylenol 1000 mg (2 extra strength tabs) or 975 mg (3 regular strength tabs) every 6 hours as needed.  Ibuprofen 400-600 mg (2-3 over the counter strength tabs) every 6 hours as needed for pain.  EXERCISES  RANGE OF MOTION (ROM) AND STRETCHING EXERCISES - Low Back Prain Most people with lower back pain will find that their symptoms get worse with excessive bending forward (flexion) or arching at the lower back (extension). The exercises that will help resolve your symptoms will focus on the opposite motion.  If you have pain, numbness or tingling which travels down into your buttocks, leg or foot, the goal of the therapy is for these symptoms to move closer to your back and eventually resolve. Sometimes, these leg symptoms will get better, but your lower back pain may worsen. This is often an indication of progress in your rehabilitation. Be very alert to any changes in your symptoms and the activities in which you participated in the 24 hours prior to the change. Sharing this information with your caregiver will allow him or her to most efficiently treat your condition. These exercises may help you when beginning to rehabilitate your injury. Your symptoms may resolve with or without further involvement from your physician, physical therapist or athletic trainer. While completing these exercises, remember:   Restoring tissue flexibility helps normal motion to return to the joints. This allows healthier, less painful movement and activity.  An effective stretch should be held for at least 30 seconds.  A stretch should never be painful. You should only feel a gentle lengthening or release in the stretched tissue. FLEXION RANGE OF MOTION AND STRETCHING EXERCISES:  STRETCH - Flexion, Single Knee to Chest   Lie on a  firm bed or floor with both legs extended in front of you.  Keeping one leg in contact with the floor, bring your opposite knee to your chest. Hold your leg in place by either grabbing behind your thigh or at your knee.  Pull until you feel a gentle stretch in your low back. Hold 30 seconds.  Slowly release your grasp and repeat the exercise with the opposite side. Repeat 2 times. Complete this exercise 3 times per week.   STRETCH - Flexion, Double Knee to Chest  Lie on a firm bed or floor with both legs extended in front of you.  Keeping one leg in contact with the floor, bring your opposite knee to your chest.  Tense your stomach muscles to support your back and then lift your other knee to your chest. Hold your legs in place by either grabbing behind your thighs or at your knees.  Pull both knees toward your chest until you feel a gentle stretch in your low back. Hold 30 seconds.  Tense your stomach muscles and slowly return one leg at a time to the floor. Repeat 2 times. Complete this exercise 3 times per week.   STRETCH - Low Trunk Rotation  Lie on a firm bed or floor. Keeping your legs in front of you, bend your knees so they are both pointed toward the ceiling and your feet are flat on the floor.  Extend your arms out to the side. This will stabilize your upper body by keeping your shoulders in contact with the floor.  Gently and slowly  drop both knees together to one side until you feel a gentle stretch in your low back. Hold for 30 seconds.  Tense your stomach muscles to support your lower back as you bring your knees back to the starting position. Repeat the exercise to the other side. Repeat 2 times. Complete this exercise at least 3 times per week.   EXTENSION RANGE OF MOTION AND FLEXIBILITY EXERCISES:  STRETCH - Extension, Prone on Elbows   Lie on your stomach on the floor, a bed will be too soft. Place your palms about shoulder width apart and at the height of your  head.  Place your elbows under your shoulders. If this is too painful, stack pillows under your chest.  Allow your body to relax so that your hips drop lower and make contact more completely with the floor.  Hold this position for 30 seconds.  Slowly return to lying flat on the floor. Repeat 2 times. Complete this exercise 3 times per week.   RANGE OF MOTION - Extension, Prone Press Ups  Lie on your stomach on the floor, a bed will be too soft. Place your palms about shoulder width apart and at the height of your head.  Keeping your back as relaxed as possible, slowly straighten your elbows while keeping your hips on the floor. You may adjust the placement of your hands to maximize your comfort. As you gain motion, your hands will come more underneath your shoulders.  Hold this position 30 seconds.  Slowly return to lying flat on the floor. Repeat 2 times. Complete this exercise 3 times per week.   RANGE OF MOTION- Quadruped, Neutral Spine   Assume a hands and knees position on a firm surface. Keep your hands under your shoulders and your knees under your hips. You may place padding under your knees for comfort.  Drop your head and point your tailbone toward the ground below you. This will round out your lower back like an angry cat. Hold this position for 30 seconds.  Slowly lift your head and release your tail bone so that your back sags into a large arch, like an old horse.  Hold this position for 30 seconds.  Repeat this until you feel limber in your low back.  Now, find your "sweet spot." This will be the most comfortable position somewhere between the two previous positions. This is your neutral spine. Once you have found this position, tense your stomach muscles to support your low back.  Hold this position for 30 seconds. Repeat 2 times. Complete this exercise 3 times per week.   STRENGTHENING EXERCISES - Low Back Sprain These exercises may help you when beginning to  rehabilitate your injury. These exercises should be done near your "sweet spot." This is the neutral, low-back arch, somewhere between fully rounded and fully arched, that is your least painful position. When performed in this safe range of motion, these exercises can be used for people who have either a flexion or extension based injury. These exercises may resolve your symptoms with or without further involvement from your physician, physical therapist or athletic trainer. While completing these exercises, remember:   Muscles can gain both the endurance and the strength needed for everyday activities through controlled exercises.  Complete these exercises as instructed by your physician, physical therapist or athletic trainer. Increase the resistance and repetitions only as guided.  You may experience muscle soreness or fatigue, but the pain or discomfort you are trying to eliminate should never worsen  during these exercises. If this pain does worsen, stop and make certain you are following the directions exactly. If the pain is still present after adjustments, discontinue the exercise until you can discuss the trouble with your caregiver.  STRENGTHENING - Deep Abdominals, Pelvic Tilt   Lie on a firm bed or floor. Keeping your legs in front of you, bend your knees so they are both pointed toward the ceiling and your feet are flat on the floor.  Tense your lower abdominal muscles to press your low back into the floor. This motion will rotate your pelvis so that your tail bone is scooping upwards rather than pointing at your feet or into the floor. With a gentle tension and even breathing, hold this position for 3 seconds. Repeat 2 times. Complete this exercise 3 times per week.   STRENGTHENING - Abdominals, Crunches   Lie on a firm bed or floor. Keeping your legs in front of you, bend your knees so they are both pointed toward the ceiling and your feet are flat on the floor. Cross your arms over  your chest.  Slightly tip your chin down without bending your neck.  Tense your abdominals and slowly lift your trunk high enough to just clear your shoulder blades. Lifting higher can put excessive stress on the lower back and does not further strengthen your abdominal muscles.  Control your return to the starting position. Repeat 2 times. Complete this exercise 3 times per week.   STRENGTHENING - Quadruped, Opposite UE/LE Lift   Assume a hands and knees position on a firm surface. Keep your hands under your shoulders and your knees under your hips. You may place padding under your knees for comfort.  Find your neutral spine and gently tense your abdominal muscles so that you can maintain this position. Your shoulders and hips should form a rectangle that is parallel with the floor and is not twisted.  Keeping your trunk steady, lift your right hand no higher than your shoulder and then your left leg no higher than your hip. Make sure you are not holding your breath. Hold this position for 30 seconds.  Continuing to keep your abdominal muscles tense and your back steady, slowly return to your starting position. Repeat with the opposite arm and leg. Repeat 2 times. Complete this exercise 3 times per week.   STRENGTHENING - Abdominals and Quadriceps, Straight Leg Raise   Lie on a firm bed or floor with both legs extended in front of you.  Keeping one leg in contact with the floor, bend the other knee so that your foot can rest flat on the floor.  Find your neutral spine, and tense your abdominal muscles to maintain your spinal position throughout the exercise.  Slowly lift your straight leg off the floor about 6 inches for a count of 3, making sure to not hold your breath.  Still keeping your neutral spine, slowly lower your leg all the way to the floor. Repeat this exercise with each leg 2 times. Complete this exercise 3 times per week.  POSTURE AND BODY MECHANICS CONSIDERATIONS -  Low Back Sprain Keeping correct posture when sitting, standing or completing your activities will reduce the stress put on different body tissues, allowing injured tissues a chance to heal and limiting painful experiences. The following are general guidelines for improved posture.  While reading these guidelines, remember:  The exercises prescribed by your provider will help you have the flexibility and strength to maintain correct postures.  The correct posture provides the best environment for your joints to work. All of your joints have less wear and tear when properly supported by a spine with good posture. This means you will experience a healthier, less painful body.  Correct posture must be practiced with all of your activities, especially prolonged sitting and standing. Correct posture is as important when doing repetitive low-stress activities (typing) as it is when doing a single heavy-load activity (lifting).  RESTING POSITIONS Consider which positions are most painful for you when choosing a resting position. If you have pain with flexion-based activities (sitting, bending, stooping, squatting), choose a position that allows you to rest in a less flexed posture. You would want to avoid curling into a fetal position on your side. If your pain worsens with extension-based activities (prolonged standing, working overhead), avoid resting in an extended position such as sleeping on your stomach. Most people will find more comfort when they rest with their spine in a more neutral position, neither too rounded nor too arched. Lying on a non-sagging bed on your side with a pillow between your knees, or on your back with a pillow under your knees will often provide some relief. Keep in mind, being in any one position for a prolonged period of time, no matter how correct your posture, can still lead to stiffness.  PROPER SITTING POSTURE In order to minimize stress and discomfort on your spine, you  must sit with correct posture. Sitting with good posture should be effortless for a healthy body. Returning to good posture is a gradual process. Many people can work toward this most comfortably by using various supports until they have the flexibility and strength to maintain this posture on their own. When sitting with proper posture, your ears will fall over your shoulders and your shoulders will fall over your hips. You should use the back of the chair to support your upper back. Your lower back will be in a neutral position, just slightly arched. You may place a small pillow or folded towel at the base of your lower back for  support.  When working at a desk, create an environment that supports good, upright posture. Without extra support, muscles tire, which leads to excessive strain on joints and other tissues. Keep these recommendations in mind:  CHAIR:  A chair should be able to slide under your desk when your back makes contact with the back of the chair. This allows you to work closely.  The chair's height should allow your eyes to be level with the upper part of your monitor and your hands to be slightly lower than your elbows.  BODY POSITION  Your feet should make contact with the floor. If this is not possible, use a foot rest.  Keep your ears over your shoulders. This will reduce stress on your neck and low back.  INCORRECT SITTING POSTURES  If you are feeling tired and unable to assume a healthy sitting posture, do not slouch or slump. This puts excessive strain on your back tissues, causing more damage and pain. Healthier options include:  Using more support, like a lumbar pillow.  Switching tasks to something that requires you to be upright or walking.  Talking a brief walk.  Lying down to rest in a neutral-spine position.  PROLONGED STANDING WHILE SLIGHTLY LEANING FORWARD  When completing a task that requires you to lean forward while standing in one place for a long  time, place either foot up on a stationary 2-4  inch high object to help maintain the best posture. When both feet are on the ground, the lower back tends to lose its slight inward curve. If this curve flattens (or becomes too large), then the back and your other joints will experience too much stress, tire more quickly, and can cause pain.  CORRECT STANDING POSTURES Proper standing posture should be assumed with all daily activities, even if they only take a few moments, like when brushing your teeth. As in sitting, your ears should fall over your shoulders and your shoulders should fall over your hips. You should keep a slight tension in your abdominal muscles to brace your spine. Your tailbone should point down to the ground, not behind your body, resulting in an over-extended swayback posture.   INCORRECT STANDING POSTURES  Common incorrect standing postures include a forward head, locked knees and/or an excessive swayback. WALKING Walk with an upright posture. Your ears, shoulders and hips should all line-up.  PROLONGED ACTIVITY IN A FLEXED POSITION When completing a task that requires you to bend forward at your waist or lean over a low surface, try to find a way to stabilize 3 out of 4 of your limbs. You can place a hand or elbow on your thigh or rest a knee on the surface you are reaching across. This will provide you more stability, so that your muscles do not tire as quickly. By keeping your knees relaxed, or slightly bent, you will also reduce stress across your lower back. CORRECT LIFTING TECHNIQUES  DO :  Assume a wide stance. This will provide you more stability and the opportunity to get as close as possible to the object which you are lifting.  Tense your abdominals to brace your spine. Bend at the knees and hips. Keeping your back locked in a neutral-spine position, lift using your leg muscles. Lift with your legs, keeping your back straight.  Test the weight of unknown objects  before attempting to lift them.  Try to keep your elbows locked down at your sides in order get the best strength from your shoulders when carrying an object.     Always ask for help when lifting heavy or awkward objects. INCORRECT LIFTING TECHNIQUES DO NOT:   Lock your knees when lifting, even if it is a small object.  Bend and twist. Pivot at your feet or move your feet when needing to change directions.  Assume that you can safely pick up even a paperclip without proper posture.

## 2017-09-30 NOTE — Progress Notes (Signed)
Chief Complaint  Patient presents with  . Establish Care       New Patient Visit SUBJECTIVE: HPI: Shawn Perez is an 52 y.o.male who is being seen for establishing care.  The patient was previously seen at [facility], changing here due to ease of location compared with previous office.  Over past 2 years, has been having R lower back pain that radiates down his R thigh and through his groin. Certain positions ease the pain. He has been tx'd for UTI's in the past that did not seem to help. It comes and goes. Sitting makes it worse. No urinary complaints, continence.   Allergies  Allergen Reactions  . Fenofibrate Rash  . Penicillins Rash    Happened in childhood    Past Medical History:  Diagnosis Date  . Diabetes mellitus without complication ([place])   . Heart murmur    since birth  . Hyperlipidemia 2011  . Hypertension   . Temporomandibular jaw dysfunction     ROS MSK: +Low back pain  Respiratory: Denies dyspnea   OBJECTIVE: BP 124/72 (BP Location: Left Arm, Patient Position: Sitting, Cuff Size: Normal)   Pulse 78   Temp 98.3 F (36.8 C) (Oral)   Ht 5\' 8"  (1.727 m)   Wt 189 lb 2 oz (85.8 kg)   SpO2 97%   BMI 28.76 kg/m   Constitutional: -  VS reviewed -  Well developed, well nourished, appears stated age -  No apparent distress  Psychiatric: -  Oriented to person, place, and time -  Memory intact -  Affect and mood normal -  Fluent conversation, good eye contact -  Judgment and insight age appropriate  Eye: -  Conjunctivae clear, no discharge -  Pupils symmetric, round, reactive to light  Neurological:  -  CN II - XII grossly intact -  Sensation intact to light touch, equal bilaterally  Musculoskeletal: -  No clubbing, no cyanosis -  Mild TTP over the R erector spinae group at approx T12-L2 -  TTP over R greater troch -  No TTP w resisted hip abduction on R  Skin: -  No significant lesion on inspection -  Warm and dry to palpation    ASSESSMENT/PLAN: Greater trochanteric bursitis of right hip  Chronic right-sided low back pain without sciatica  Type 2 diabetes mellitus without complication, without long-term current use of insulin ([place]) - Plan: Microalbumin / creatinine urine ratio  Offered injection vs supportive care, he chose the latter. Ice, Tylenol, home stretches/exercises, heat.  Ck urine while I have him here.  Patient should return 4.5 mo for 6 mo ck for HTN and DM. The patient voiced understanding and agreement to the plan.   Arnold, DO 09/30/17  8:29 AM

## 2017-10-16 MED FILL — metFORMIN HCL 500 MG TABS: 500 | 30 days supply | Qty: 60 | Fill #1

## 2017-10-17 MED FILL — FLUoxetine HCL 20 MG CAPS: 20 | 30 days supply | Qty: 30 | Fill #2

## 2017-10-17 MED FILL — FLUVASTATIN NA 40 MG CAP: 40 | 30 days supply | Qty: 30 | Fill #2

## 2017-10-17 MED FILL — VALSARTAN 80 MG TABLET: 80 | 30 days supply | Qty: 30 | Fill #2

## 2017-11-21 DIAGNOSIS — H401131 Primary open-angle glaucoma, bilateral, mild stage: Secondary | ICD-10-CM | POA: Diagnosis not present

## 2017-11-21 DIAGNOSIS — Z7984 Long term (current) use of oral hypoglycemic drugs: Secondary | ICD-10-CM | POA: Diagnosis not present

## 2017-11-21 DIAGNOSIS — E119 Type 2 diabetes mellitus without complications: Secondary | ICD-10-CM | POA: Diagnosis not present

## 2017-11-21 MED FILL — FLUVASTATIN NA 40 MG CAP: 40 | 30 days supply | Qty: 30 | Fill #3

## 2017-11-21 MED FILL — VALSARTAN 80 MG TABLET: 80 | 30 days supply | Qty: 30 | Fill #3

## 2017-11-21 MED FILL — metFORMIN HCL 500 MG TABS: 500 | 30 days supply | Qty: 60 | Fill #2

## 2017-11-21 MED FILL — FLUoxetine HCL 20 MG CAPS: 20 | 30 days supply | Qty: 30 | Fill #3

## 2017-12-02 DIAGNOSIS — I10 Essential (primary) hypertension: Secondary | ICD-10-CM | POA: Diagnosis not present

## 2017-12-02 DIAGNOSIS — G4733 Obstructive sleep apnea (adult) (pediatric): Secondary | ICD-10-CM | POA: Diagnosis not present

## 2017-12-26 MED FILL — VALSARTAN 80 MG TABLET: 80 | 30 days supply | Qty: 30 | Fill #4

## 2017-12-26 MED FILL — metFORMIN HCL 500 MG TABS: 500 | 30 days supply | Qty: 90 | Fill #3

## 2017-12-26 MED FILL — FLUVASTATIN NA 40 MG CAP: 40 | 30 days supply | Qty: 30 | Fill #4

## 2017-12-27 MED FILL — FLUoxetine HCL 20 MG CAPS: 20 | 30 days supply | Qty: 30 | Fill #4

## 2018-01-30 MED FILL — VALSARTAN 80 MG TABLET: 80 | 30 days supply | Qty: 30 | Fill #5

## 2018-01-30 MED FILL — FLUoxetine HCL 20 MG CAPS: 20 | 30 days supply | Qty: 30 | Fill #5

## 2018-01-30 MED FILL — FLUVASTATIN NA 40 MG CAP: 40 | 30 days supply | Qty: 30 | Fill #5

## 2018-02-10 ENCOUNTER — Ambulatory Visit: Payer: 59 | Admitting: Family Medicine

## 2018-02-10 DIAGNOSIS — Z0289 Encounter for other administrative examinations: Secondary | ICD-10-CM

## 2018-02-17 ENCOUNTER — Encounter: Payer: Self-pay | Admitting: Family Medicine

## 2018-02-26 ENCOUNTER — Emergency Department (HOSPITAL_BASED_OUTPATIENT_CLINIC_OR_DEPARTMENT_OTHER): Payer: 59

## 2018-02-26 ENCOUNTER — Encounter (HOSPITAL_BASED_OUTPATIENT_CLINIC_OR_DEPARTMENT_OTHER): Payer: Self-pay | Admitting: Emergency Medicine

## 2018-02-26 ENCOUNTER — Emergency Department (HOSPITAL_BASED_OUTPATIENT_CLINIC_OR_DEPARTMENT_OTHER)
Admission: EM | Admit: 2018-02-26 | Discharge: 2018-02-26 | Disposition: A | Discharge status: Home / Self Care | Payer: 59 | Attending: Emergency Medicine | Admitting: Emergency Medicine

## 2018-02-26 ENCOUNTER — Other Ambulatory Visit: Payer: Self-pay

## 2018-02-26 DIAGNOSIS — Z7984 Long term (current) use of oral hypoglycemic drugs: Secondary | ICD-10-CM | POA: Diagnosis not present

## 2018-02-26 DIAGNOSIS — N50811 Right testicular pain: Secondary | ICD-10-CM | POA: Diagnosis not present

## 2018-02-26 DIAGNOSIS — E119 Type 2 diabetes mellitus without complications: Secondary | ICD-10-CM | POA: Diagnosis not present

## 2018-02-26 DIAGNOSIS — Z79899 Other long term (current) drug therapy: Secondary | ICD-10-CM | POA: Insufficient documentation

## 2018-02-26 DIAGNOSIS — N50819 Testicular pain, unspecified: Secondary | ICD-10-CM

## 2018-02-26 DIAGNOSIS — I10 Essential (primary) hypertension: Secondary | ICD-10-CM | POA: Insufficient documentation

## 2018-02-26 DIAGNOSIS — M549 Dorsalgia, unspecified: Secondary | ICD-10-CM | POA: Diagnosis not present

## 2018-02-26 DIAGNOSIS — Z87891 Personal history of nicotine dependence: Secondary | ICD-10-CM | POA: Diagnosis not present

## 2018-02-26 DIAGNOSIS — N433 Hydrocele, unspecified: Secondary | ICD-10-CM | POA: Diagnosis not present

## 2018-02-26 LAB — URINALYSIS, ROUTINE W REFLEX MICROSCOPIC
BILIRUBIN URINE: NEGATIVE
GLUCOSE, UA: 100 mg/dL — AB
HGB URINE DIPSTICK: NEGATIVE
KETONES UR: NEGATIVE mg/dL
Leukocytes, UA: NEGATIVE
Nitrite: NEGATIVE
PH: 6 (ref 5.0–8.0)
Protein, ur: NEGATIVE mg/dL
SPECIFIC GRAVITY, URINE: 1.015 (ref 1.005–1.030)

## 2018-02-26 MED ORDER — TRAMADOL HCL 50 MG PO TABS: 50.0000 mg | ORAL_TABLET | Freq: Four times a day (QID) | ORAL | 0 refills | Status: AC | PRN

## 2018-02-26 MED ORDER — NAPROXEN 500 MG PO TABS: 500.0000 mg | ORAL_TABLET | Freq: Two times a day (BID) | ORAL | 0 refills | Status: AC

## 2018-02-26 MED FILL — traMADol HCL 50 MG TABS: 50 | 4 days supply | Qty: 15 | Fill #0

## 2018-02-26 MED FILL — NAPROXEN 500 MG TABLET: 500 | 5 days supply | Qty: 10 | Fill #0

## 2018-02-26 NOTE — ED Provider Notes (Signed)
[facility] EMERGENCY DEPARTMENT Provider Note   CSN: [id number] Arrival date & time: 02/26/18  1354     History   Chief Complaint Chief Complaint  Patient presents with  . Testicle Pain    HPI Shawn Perez is a 52 y.o. male with history of diabetes, hyperlipidemia, hypertension is here for evaluation of right testicular pain.  Onset yesterday morning.  Pain is sharp, severe, intermittent but close to being constant.  Pain initially to the right testicle but now has spread to the right groin, right upper thigh and right buttock.  Aggravated slightly by certain movements and direct palpation.  Has not tried any interventions PTA.  He denies recent exertional activity.  Had one episode of running or bowel movements today but this has resolved.  He is sexually active with wife only of 37 years.  Denies fevers, chills, nausea, vomiting, dysuria, hematuria, hesitancy, frequency, blood in stools, constipation, scrotal swelling or changes in skin color.  No previous history of trauma, injury or surgeries toe growing up.  No abdominal surgeries.   HPI  Past Medical History:  Diagnosis Date  . Diabetes mellitus without complication ([place])   . Heart murmur    since birth  . Hyperlipidemia 2011  . Hypertension   . Temporomandibular jaw dysfunction     Patient Active Problem List   Diagnosis Date Noted  . Overweight (BMI 25.0-29.9) 08/08/2017  . B12 deficiency 05/02/2016  . Generalized anxiety disorder 05/02/2016  . Type 2 diabetes mellitus without complication, without long-term current use of insulin ([place]) 05/02/2016  . Chronic right-sided low back pain without sciatica 05/19/2015  . Sleep apnea 03/02/2015  . Vitamin D deficiency 07/02/2014  . Elevated platelet count 07/02/2014  . Family history of premature CAD 04/03/2014  . Open-angle glaucoma 09/02/2013  . Hyperlipidemia LDL goal <70 07/22/2013  . Anxiety 03/14/2012  . GERD (gastroesophageal reflux disease)  03/14/2012  . Essential hypertension 07/11/2011    Past Surgical History:  Procedure Laterality Date  . NO PAST SURGERIES  07/09/2011   Denies surgical history  . WISDOM TOOTH EXTRACTION          Home Medications    Prior to Admission medications   Medication Sig Start Date End Date Taking? Authorizing Provider  Blood Glucose Monitoring Suppl (ONE TOUCH ULTRA SYSTEM KIT) W/DEVICE KIT 1 kit by Does not apply route once. 08/11/14   Nicky Pugh C, NP  FLUoxetine (PROZAC) 20 MG capsule Take 1 capsule (20 mg total) by mouth daily. Needs office visit prior to anymore refills 08/08/17   Kuneff, Renee A, DO  fluvastatin (LESCOL) 40 MG capsule Take 1 capsule (40 mg total) by mouth daily. Patient needs office visit prior to anymore refills. 08/08/17   Raoul Pitch, Renee A, DO  Lancets (ONETOUCH ULTRASOFT) lancets Use as instructed 08/11/14   Nicky Pugh C, NP  latanoprost (XALATAN) 0.005 % ophthalmic solution Place 1 drop into both eyes at bedtime.  11/29/11   [provider]  metFORMIN (GLUCOPHAGE) 500 MG tablet Take 2 tablets (1,000 mg total) by mouth daily with breakfast. 08/08/17   Kuneff, Renee A, DO  naproxen (NAPROSYN) 500 MG tablet Take 1 tablet (500 mg total) by mouth 2 (two) times daily with a meal for 5 days. 02/26/18 03/03/18  Kinnie Feil, PA-C  ONE TOUCH ULTRA TEST test strip TEST 1 TIME DAILY. 10/05/16   McGowen, Adrian Blackwater, MD  traMADol (ULTRAM) 50 MG tablet Take 1 tablet (50 mg total) by mouth every  6 (six) hours as needed for up to 5 days. 02/26/18 03/03/18  Kinnie Feil, PA-C  valsartan (DIOVAN) 80 MG tablet Take 1 tablet (80 mg total) by mouth daily. 08/08/17   Kuneff, Renee A, DO  vitamin B-12 (CYANOCOBALAMIN) 1000 MCG tablet TAKE 1 TABLET BY MOUTH DAILY FOR SEVEN DAYS, THEN 1 TABLET WEEKLY THERAFTER 09/24/16   Kuneff, Renee A, DO    Family History Family History  Problem Relation Age of Onset  . Hypertension Mother   . Diabetes Mother   . Early death Mother   .  Heart disease Mother   . Heart disease Sister   . Early death Brother   . Heart disease Brother   . Hyperlipidemia Brother   . Hypertension Brother   . Early death Maternal Aunt   . Heart disease Maternal Aunt   . Early death Maternal Uncle   . Heart disease Maternal Uncle   . Heart disease Maternal Grandmother   . Early death Maternal Grandmother   . Heart disease Brother     Social History Social History   Tobacco Use  . Smoking status: Former Research scientist (life sciences)  . Smokeless tobacco: Never Used  . Tobacco comment: quit in 2000 smike 1 ppd  Substance Use Topics  . Alcohol use: Yes    Alcohol/week: 0.0 oz  . Drug use: No     Allergies   Fenofibrate and Penicillins   Review of Systems Review of Systems  Genitourinary: Positive for testicular pain.       Right inguinal pain  Musculoskeletal: Positive for back pain.  All other systems reviewed and are negative.    Physical Exam Updated Vital Signs BP (!) 156/82 (BP Location: Right Arm)   Pulse (!) 59   Temp 97.6 F (36.4 C) (Oral)   Resp 18   Ht _0  (1.727 m)   Wt 80.7 kg (178 lb)   SpO2 100%   BMI 27.06 kg/m   Physical Exam  Constitutional: He is oriented to person, place, and time. He appears well-developed and well-nourished. No distress.  Non toxic.  HENT:  Head: Normocephalic and atraumatic.  Nose: Nose normal.  Moist mucous membranes   Eyes: Pupils are equal, round, and reactive to light. Conjunctivae and EOM are normal.  Neck: Normal range of motion.  Cardiovascular: Normal rate, regular rhythm, normal heart sounds and intact distal pulses.  No murmur heard. 2+ DP and radial pulses bilaterally. No LE edema.   Pulmonary/Chest: Effort normal and breath sounds normal.  Abdominal: Soft. Bowel sounds are normal. There is no tenderness.  No G/R/R. No suprapubic or CVA tenderness. Negative Murphy's and McBurney's  Genitourinary:  Genitourinary Comments:  Exam performed with EMT in room Circumcised male.  External genitalia normal without erythema, edema or lesions. No groin lymphadenopathy.  No meatus discharge. Glans and shaft smooth without tenderness, lesions, masses or deformity.  Mild tenderness to right posterior testicle. Non tender left testicle. Cremasteric reflex intact.  Musculoskeletal: Normal range of motion.  Mild tenderness to deep palpation to the right inguinal crease and medial thigh. Full PROM of hips without pain. Negative SLR bilaterally. Negative faber bilaterally. No midline tenderness to L spine or tenderness to SI joint or sciatic notch.   Neurological: He is alert and oriented to person, place, and time.  Skin: Skin is warm and dry. Capillary refill takes less than 2 seconds.  No rash to back   Psychiatric: He has a normal mood and affect. His behavior is normal. Judgment  and thought content normal.  Nursing note and vitals reviewed.    ED Treatments / Results  Labs (all labs ordered are listed, but only abnormal results are displayed) Labs Reviewed  URINALYSIS, ROUTINE W REFLEX MICROSCOPIC - Abnormal; Notable for the following components:      Result Value   Glucose, UA 100 (*)    All other components within normal limits  GC/CHLAMYDIA PROBE AMP ([place]) NOT AT [facility]    EKG None  Radiology US Scrotum W/doppler  Addendum Date: 02/26/2018   ADDENDUM REPORT: 02/26/2018 16:59 ADDENDUM: Comparison is now available with prior exam of 12/14/2013. RIGHT hydrocele is slightly larger and more complicated than on the previous exam. LEFT varicocele is new. Electronically Signed   By: Lavonia Dana M.D.   On: 02/26/2018 16:59   Result Date: 02/26/2018 CLINICAL DATA:  RIGHT testicular pain question torsion, onset of symptoms yesterday morning increased today, no injury EXAM: SCROTAL ULTRASOUND DOPPLER ULTRASOUND OF THE TESTICLES TECHNIQUE: Complete ultrasound examination of the testicles, epididymis, and other scrotal structures was performed. Color and spectral Doppler  ultrasound were also utilized to evaluate blood flow to the testicles. COMPARISON:  None. FINDINGS: Right testicle Measurements: 3.9 x 2.0 x 2.7 cm. Normal morphology without mass or calcification. Internal blood flow present within RIGHT testis on color Doppler imaging. Left testicle Measurements: 3.9 x 2.1 x 2.7 cm. Normal morphology without mass or calcification. Internal blood flow present within LEFT testis on color Doppler imaging, symmetric with RIGHT. Right epididymis:  Normal in size and appearance. Left epididymis:  Normal in size and appearance. Hydrocele: Small RIGHT hydrocele containing debris inferiorly. No LEFT hydrocele. Varicocele:  LEFT varicocele present. Pulsed Doppler interrogation of both testes demonstrates normal low resistance arterial and venous waveforms bilaterally. IMPRESSION: No evidence of testicular mass or torsion. Small RIGHT hydrocele containing debris. LEFT varicocele. Electronically Signed: By: Lavonia Dana M.D. On: 02/26/2018 16:09    Procedures Procedures (including critical care time)  Medications Ordered in ED Medications - No data to display   Initial Impression / Assessment and Plan / ED Course  I have reviewed the triage vital signs and the nursing notes.  Pertinent labs & imaging results that were available during my care of the patient were reviewed by me and considered in my medical decision making (see chart for details).  Clinical Course as of Feb 26 1738  Wed Feb 26, 2018  1532 Went to see pt who in [place]   [CG]  1623 IMPRESSION: No evidence of testicular mass or torsion.  Small RIGHT hydrocele containing debris.  LEFT varicocele.   Electronically Signed By: Lavonia Dana M.D. On: 02/26/2018 16:09    US SCROTUM W/DOPPLER [CG]    Clinical Course User Index [CG] Kinnie Feil, PA-C   52 year old male here with right testicular pain.  Pain is spreading to the right inguinal crease, right upper thigh, right buttock.  Denies  recent trauma or exertional activity.  Exam as above remarkable for very mild tenderness to posterior right testicle and right inguinal crease.  No fevers, discharge, urinary symptoms, abdominal tenderness, negative McBurney's.  No midline tenderness to L-spine.  Negative SLR.  Ultrasound obtained shows slightly larger right hydrocele with more complex appearance.  Discussed this with patient.  This may or may not be causing pain.  Also considering MSK.  Less likely hernia, torsion, appendicitis.  Will discharge with symptomatic management and follow-up with urology.  Discussed return precautions.  Final Clinical Impressions(s) / ED Diagnoses  Final diagnoses:  Testicular pain, right    ED Discharge Orders        Ordered    traMADol (ULTRAM) 50 MG tablet  Every 6 hours PRN     02/26/18 1712    naproxen (NAPROSYN) 500 MG tablet  2 times daily with meals     02/26/18 1712       Kinnie Feil, PA-C 02/26/18 1741    Julianne Rice, MD 02/27/18 1800

## 2018-02-26 NOTE — Discharge Instructions (Addendum)
You were seen in the ER for testicular pain.   Ultrasound shows a larger hydrocele on the right with more "complex" or "complicated" appearance.  The cause of this is unclear at this point. You need to follow up with urology for further evaluation.    Return for fevers, chills, worsening pain, problems with urination  Take tramadol and naproxen for pain

## 2018-02-26 NOTE — ED Triage Notes (Signed)
Reports right testicle pain which radiates into right groin and down right leg.  Denies dysuria, injury.

## 2018-02-28 LAB — GC/CHLAMYDIA PROBE AMP (~~LOC~~) NOT AT ARMC
CHLAMYDIA, DNA PROBE: NEGATIVE
Neisseria Gonorrhea: NEGATIVE

## 2018-03-03 ENCOUNTER — Encounter

## 2018-03-03 ENCOUNTER — Encounter: Payer: Self-pay | Admitting: *Deleted

## 2018-03-03 ENCOUNTER — Ambulatory Visit: Payer: 59 | Admitting: Family Medicine

## 2018-03-03 ENCOUNTER — Other Ambulatory Visit: Payer: Self-pay | Admitting: Family Medicine

## 2018-03-03 DIAGNOSIS — F411 Generalized anxiety disorder: Secondary | ICD-10-CM

## 2018-03-03 DIAGNOSIS — R102 Pelvic and perineal pain: Secondary | ICD-10-CM | POA: Diagnosis not present

## 2018-03-03 DIAGNOSIS — E785 Hyperlipidemia, unspecified: Secondary | ICD-10-CM

## 2018-03-03 DIAGNOSIS — I10 Essential (primary) hypertension: Secondary | ICD-10-CM

## 2018-03-03 MED FILL — FLUoxetine HCL 20 MG CAPS: 20 | 30 days supply | Qty: 30 | Fill #0

## 2018-03-03 MED FILL — metFORMIN HCL 500 MG TABS: 500 | 30 days supply | Qty: 90 | Fill #4

## 2018-03-03 MED FILL — VALSARTAN 80 MG TABLET: 80 | 30 days supply | Qty: 30 | Fill #0

## 2018-03-03 MED FILL — FLUVASTATIN NA 40 MG CAP: 40 | 30 days supply | Qty: 30 | Fill #0

## 2018-03-17 ENCOUNTER — Ambulatory Visit: Payer: Self-pay | Admitting: Surgery

## 2018-03-17 DIAGNOSIS — R1032 Left lower quadrant pain: Secondary | ICD-10-CM | POA: Diagnosis not present

## 2018-03-17 DIAGNOSIS — Z01818 Encounter for other preprocedural examination: Secondary | ICD-10-CM | POA: Diagnosis not present

## 2018-03-17 DIAGNOSIS — K4090 Unilateral inguinal hernia, without obstruction or gangrene, not specified as recurrent: Secondary | ICD-10-CM | POA: Diagnosis not present

## 2018-03-17 NOTE — H&P (Signed)
Shawn Perez Documented: 03/17/2018 10:35 AM Location: [facility] Patient #: [id number] DOB: 05-Feb-1966 Married / Language: [name] / Race: White Male  History of Present Illness [name] MD; 03/17/2018 11:29 AM) The patient is a 52 year old male who presents with an inguinal hernia. Note for "Inguinal hernia": ` ` ` Patient sent for surgical consultation at the request of Dr Nicolette Bang, [facility]  Chief Complaint: Right testicular/groin pain and probable hernia. ` ` The patient is a pleasant active male. Comes with his wife. For the past 2 years he's had intermittent pains in his right groin. Radiating to his testicle. Sometimes to his back. Sometimes to his anterior thigh as well. Worse especially with lifting. Occasional discomfort on the left side as well but not as intense. He did have a right femoral arterial catheterization done many years ago to rule out ophthalmic artery/retinal issues. Does not recall having groin pain since then. Just starting 2 years ago. No history of hip or pelvic pain. No history of motor vehicle collision's. No lumbar spinal issues that he knows. He is a supervisor/director. Not a lot of intense lifting. No abdominal surgery. No hernia surgery. He quit smoking 15 years ago. Some diabetes. No chronic pain issues. He uses nasal BiPAP for sleep apnea at night.   (Review of systems as stated in this history (HPI) or in the review of systems. Otherwise all other 12 point ROS are negative) ` ` `   Allergies Sabino Gasser; 03/17/2018 10:59 AM) Penicillin G Potassium *PENICILLINS* Allergies Reconciled  Medication History Sabino Gasser; 03/17/2018 10:57 AM) FLUoxetine HCl (20MG  Capsule, Oral) Active. Fluvastatin Sodium (40MG  Capsule, Oral) Active. MetFORMIN HCl (500MG  Tablet, Oral) Active. Naproxen (500MG  Tablet, Oral) Active. Valsartan (80MG  Tablet, Oral) Active. One Touch Fine Point Lancets  Active. Medications Reconciled  Social History Sabino Gasser; 03/17/2018 10:55 AM) Alcohol use Occasional alcohol use. Caffeine use Coffee. Tobacco use Former smoker.  Family History Sabino Gasser; 03/17/2018 10:55 AM) Diabetes Mellitus Brother, Mother. Heart Disease Brother, Mother. Hypertension Brother.  Other Problems Sabino Gasser; 03/17/2018 10:55 AM) Anxiety Disorder Diabetes Mellitus Heart murmur High blood pressure Hypercholesterolemia     Review of Systems Sabino Gasser; 03/17/2018 10:55 AM) General Not Present- Appetite Loss, Chills, Fatigue, Fever, Night Sweats, Weight Gain and Weight Loss. Skin Not Present- Change in Wart/Mole, Dryness, Hives, Jaundice, New Lesions, Non-Healing Wounds, Rash and Ulcer. HEENT Present- Ringing in the Ears. Not Present- Earache, Hearing Loss, Hoarseness, Nose Bleed, Oral Ulcers, Seasonal Allergies, Sinus Pain, Sore Throat, Visual Disturbances, Wears glasses/contact lenses and Yellow Eyes. Respiratory Not Present- Bloody sputum, Chronic Cough, Difficulty Breathing, Snoring and Wheezing. Breast Not Present- Breast Mass, Breast Pain, Nipple Discharge and Skin Changes. Cardiovascular Not Present- Chest Pain, Difficulty Breathing Lying Down, Leg Cramps, Palpitations, Rapid Heart Rate, Shortness of Breath and Swelling of Extremities. Gastrointestinal Not Present- Abdominal Pain, Bloating, Bloody Stool, Change in Bowel Habits, Chronic diarrhea, Constipation, Difficulty Swallowing, Excessive gas, Gets full quickly at meals, Hemorrhoids, Indigestion, Nausea, Rectal Pain and Vomiting. Male Genitourinary Not Present- Blood in Urine, Change in Urinary Stream, Frequency, Impotence, Nocturia, Painful Urination, Urgency and Urine Leakage. Musculoskeletal Present- Back Pain. Not Present- Joint Pain, Joint Stiffness, Muscle Pain, Muscle Weakness and Swelling of Extremities. Neurological Not Present- Decreased Memory, Fainting, Headaches, Numbness,  Seizures, Tingling, Tremor, Trouble walking and Weakness. Psychiatric Present- Anxiety. Not Present- Bipolar, Change in Sleep Pattern, Depression, Fearful and Frequent crying. Endocrine Not Present- Cold Intolerance, Excessive Hunger, Hair Changes, Heat Intolerance, Hot flashes  and New Diabetes. Hematology Not Present- Blood Thinners, Easy Bruising, Excessive bleeding, Gland problems, HIV and Persistent Infections.  Vitals Sabino Gasser; 03/17/2018 10:58 AM) 03/17/2018 10:57 AM Weight: 1836 lb Height: 63in Body Surface Area: 4.96 m Body Mass Index: 325.23 kg/m  Temp.: 98.36F(Oral)  Pulse: 77 (Regular)  BP: 130/80 (Sitting, Left Arm, Standard)      Physical Exam [name] MD; 03/17/2018 11:26 AM)  General Mental Status-Alert. General Appearance-Not in acute distress, Not Sickly. Orientation-Oriented X3. Hydration-Well hydrated. Voice-Normal.  Integumentary Global Assessment Upon inspection and palpation of skin surfaces of the - Axillae: non-tender, no inflammation or ulceration, no drainage. and Distribution of scalp and body hair is normal. General Characteristics Temperature - normal warmth is noted.  Head and Neck Head-normocephalic, atraumatic with no lesions or palpable masses. Face Global Assessment - atraumatic, no absence of expression. Neck Global Assessment - no abnormal movements, no bruit auscultated on the right, no bruit auscultated on the left, no decreased range of motion, non-tender. Trachea-midline. Thyroid Gland Characteristics - non-tender.  Eye Eyeball - Left-Extraocular movements intact, No Nystagmus. Eyeball - Right-Extraocular movements intact, No Nystagmus. Cornea - Left-No Hazy. Cornea - Right-No Hazy. Sclera/Conjunctiva - Left-No scleral icterus, No Discharge. Sclera/Conjunctiva - Right-No scleral icterus, No Discharge. Pupil - Left-Direct reaction to light normal. Pupil - Right-Direct  reaction to light normal. Note: Wears glasses. Vision corrected  ENMT Ears Pinna - Left - no drainage observed, no generalized tenderness observed. Right - no drainage observed, no generalized tenderness observed. Nose and Sinuses External Inspection of the Nose - no destructive lesion observed. Inspection of the nares - Left - quiet respiration. Right - quiet respiration. Mouth and Throat Lips - Upper Lip - no fissures observed, no pallor noted. Lower Lip - no fissures observed, no pallor noted. Nasopharynx - no discharge present. Oral Cavity/Oropharynx - Tongue - no dryness observed. Oral Mucosa - no cyanosis observed. Hypopharynx - no evidence of airway distress observed.  Chest and Lung Exam Inspection Movements - Normal and Symmetrical. Accessory muscles - No use of accessory muscles in breathing. Palpation Palpation of the chest reveals - Non-tender. Auscultation Breath sounds - Normal and Clear.  Cardiovascular Auscultation Rhythm - Regular. Murmurs & Other Heart Sounds - Auscultation of the heart reveals - No Murmurs and No Systolic Clicks.  Abdomen Inspection Inspection of the abdomen reveals - No Visible peristalsis and No Abnormal pulsations. Umbilicus - No Bleeding, No Urine drainage. Palpation/Percussion Palpation and Percussion of the abdomen reveal - Soft, Non Tender, No Rebound tenderness, No Rigidity (guarding) and No Cutaneous hyperesthesia. Note: Abdomen soft. Nontender. Moderate diastases recti Not distended. No umbilical or incisional hernias. No guarding.  Male Genitourinary Sexual Maturity Tanner 5 - Adult hair pattern and Adult penile size and shape. Note: Sensitivity at right external inguinal ring suspicious for hernia. Subtle impulse on right femoral/inner thigh region as well. Discomfort and sensitivity external ring on left side but no definite hernia there. Normal external male genitalia. No obvious varicocele or hydrocele to my examination. No  testicular masses.  Peripheral Vascular Upper Extremity Inspection - Left - No Cyanotic nailbeds, Not Ischemic. Right - No Cyanotic nailbeds, Not Ischemic.  Neurologic Neurologic evaluation reveals -normal attention span and ability to concentrate, able to name objects and repeat phrases. Appropriate fund of knowledge , normal sensation and normal coordination. Mental Status Affect - not angry, not paranoid. Cranial Nerves-Normal Bilaterally. Gait-Normal.  Neuropsychiatric Mental status exam performed with findings of-able to articulate well with normal speech/language, rate, volume  and coherence, thought content normal with ability to perform basic computations and apply abstract reasoning and no evidence of hallucinations, delusions, obsessions or homicidal/suicidal ideation.  Musculoskeletal Global Assessment Spine, Ribs and Pelvis - no instability, subluxation or laxity. Right Upper Extremity - no instability, subluxation or laxity.  Lymphatic Head & Neck  General Head & Neck Lymphatics: Bilateral - Description - No Localized lymphadenopathy. Axillary  General Axillary Region: Bilateral - Description - No Localized lymphadenopathy. Femoral & Inguinal  Generalized Femoral & Inguinal Lymphatics: Left - Description - No Localized lymphadenopathy. Right - Description - No Localized lymphadenopathy.    Assessment & Plan [name] MD; 03/17/2018 11:23 AM)  RIGHT INGUINAL HERNIA (K40.90) Impression: Intermittent sharp right groin pains suspicious for inguinal hernia. I feel an impulse in his right femoral region as well. Some intermittent pains on the left side but no definite hernia there.  I think he would benefit from laparoscopic exploration and repair of hernias found. He's been having intermittent symptoms for 2 years. Ultrasound was underwent down for other etiologies. The rest of his differential diagnosis for pelvic/orthopedic/gastrointestinal/kidney stone  etiologies is unlikely. He seemed hesitant to consider surgery. Wife much more convinced. In the end, he agrees that this is persistent and knee which is to consider surgical repair   GROIN PAIN, LEFT (R10.32) Impression: Some sensitivity left groin without a definite impulse. Will look over there just in case.   PREOP - ING HERNIA - ENCOUNTER FOR PREOPERATIVE EXAMINATION FOR GENERAL SURGICAL PROCEDURE (Z01.818)  Current Plans You are being scheduled for surgery- Our schedulers will call you.  You should hear from our office's scheduling department within 5 working days about the location, date, and time of surgery. We try to make accommodations for patient's preferences in scheduling surgery, but sometimes the OR schedule or the surgeon's schedule prevents [place] from making those accommodations.  If you have not heard from our office [phone]) in 5 working days, call the office and ask for your surgeon's nurse.  If you have other questions about your diagnosis, plan, or surgery, call the office and ask for your surgeon's nurse.  Written instructions provided The anatomy & physiology of the abdominal wall and pelvic floor was discussed. The pathophysiology of hernias in the inguinal and pelvic region was discussed. Natural history risks such as progressive enlargement, pain, incarceration, and strangulation was discussed. Contributors to complications such as smoking, obesity, diabetes, prior surgery, etc were discussed.  I feel the risks of no intervention will lead to serious problems that outweigh the operative risks; therefore, I recommended surgery to reduce and repair the hernia. I explained laparoscopic techniques with possible need for an open approach. I noted usual use of mesh to patch and/or buttress hernia repair  Risks such as bleeding, infection, abscess, need for further treatment, heart attack, death, and other risks were discussed. I noted a good likelihood this  will help address the problem. Goals of post-operative recovery were discussed as well. Possibility that this will not correct all symptoms was explained. I stressed the importance of low-impact activity, aggressive pain control, avoiding constipation, & not pushing through pain to minimize risk of post-operative chronic pain or injury. Possibility of reherniation was discussed. We will work to minimize complications.  An educational handout further explaining the pathology & treatment options was given as well. Questions were answered. The patient expresses understanding & wishes to proceed with surgery.  Pt Education - Pamphlet Given - Laparoscopic Hernia Repair: discussed with patient and provided information.  Pt Education - CCS Pain Control (Moesha Sarchet) Pt Education - [facility]CS Hernia Post-Op HCI (Starla Deller): discussed with patient and provided information. Pt Education - CCS Mesh education: discussed with patient and provided information.  OSA (OBSTRUCTIVE SLEEP APNEA) (G47.33) Impression: Well-controlled with nasal BiPAP. Consider bringing the mask to be used in PACU.  [name], MD, FACS, MASCRS Gastrointestinal and Minimally Invasive Surgery    [address] [place], [place] [address] [phone] Main / Paging [phone] Fax

## 2018-03-17 NOTE — H&P (View-Only) (Signed)
Janan Ridge Documented: 03/17/2018 10:35 AM Location: [facility] Patient #: [id number] DOB: 18-Dec-1965 Married / Language: [name] / Race: White Male  History of Present Illness [name] MD; 03/17/2018 11:29 AM) The patient is a 52 year old male who presents with an inguinal hernia. Note for "Inguinal hernia": ` ` ` Patient sent for surgical consultation at the request of Dr Nicolette Bang, [facility]  Chief Complaint: Right testicular/groin pain and probable hernia. ` ` The patient is a pleasant active male. Comes with his wife. For the past 2 years he's had intermittent pains in his right groin. Radiating to his testicle. Sometimes to his back. Sometimes to his anterior thigh as well. Worse especially with lifting. Occasional discomfort on the left side as well but not as intense. He did have a right femoral arterial catheterization done many years ago to rule out ophthalmic artery/retinal issues. Does not recall having groin pain since then. Just starting 2 years ago. No history of hip or pelvic pain. No history of motor vehicle collision's. No lumbar spinal issues that he knows. He is a supervisor/director. Not a lot of intense lifting. No abdominal surgery. No hernia surgery. He quit smoking 15 years ago. Some diabetes. No chronic pain issues. He uses nasal BiPAP for sleep apnea at night.   (Review of systems as stated in this history (HPI) or in the review of systems. Otherwise all other 12 point ROS are negative) ` ` `   Allergies Sabino Gasser; 03/17/2018 10:59 AM) Penicillin G Potassium *PENICILLINS* Allergies Reconciled  Medication History Sabino Gasser; 03/17/2018 10:57 AM) FLUoxetine HCl (20MG  Capsule, Oral) Active. Fluvastatin Sodium (40MG  Capsule, Oral) Active. MetFORMIN HCl (500MG  Tablet, Oral) Active. Naproxen (500MG  Tablet, Oral) Active. Valsartan (80MG  Tablet, Oral) Active. One Touch Fine Point Lancets  Active. Medications Reconciled  Social History Sabino Gasser; 03/17/2018 10:55 AM) Alcohol use Occasional alcohol use. Caffeine use Coffee. Tobacco use Former smoker.  Family History Sabino Gasser; 03/17/2018 10:55 AM) Diabetes Mellitus Brother, Mother. Heart Disease Brother, Mother. Hypertension Brother.  Other Problems Sabino Gasser; 03/17/2018 10:55 AM) Anxiety Disorder Diabetes Mellitus Heart murmur High blood pressure Hypercholesterolemia     Review of Systems Sabino Gasser; 03/17/2018 10:55 AM) General Not Present- Appetite Loss, Chills, Fatigue, Fever, Night Sweats, Weight Gain and Weight Loss. Skin Not Present- Change in Wart/Mole, Dryness, Hives, Jaundice, New Lesions, Non-Healing Wounds, Rash and Ulcer. HEENT Present- Ringing in the Ears. Not Present- Earache, Hearing Loss, Hoarseness, Nose Bleed, Oral Ulcers, Seasonal Allergies, Sinus Pain, Sore Throat, Visual Disturbances, Wears glasses/contact lenses and Yellow Eyes. Respiratory Not Present- Bloody sputum, Chronic Cough, Difficulty Breathing, Snoring and Wheezing. Breast Not Present- Breast Mass, Breast Pain, Nipple Discharge and Skin Changes. Cardiovascular Not Present- Chest Pain, Difficulty Breathing Lying Down, Leg Cramps, Palpitations, Rapid Heart Rate, Shortness of Breath and Swelling of Extremities. Gastrointestinal Not Present- Abdominal Pain, Bloating, Bloody Stool, Change in Bowel Habits, Chronic diarrhea, Constipation, Difficulty Swallowing, Excessive gas, Gets full quickly at meals, Hemorrhoids, Indigestion, Nausea, Rectal Pain and Vomiting. Male Genitourinary Not Present- Blood in Urine, Change in Urinary Stream, Frequency, Impotence, Nocturia, Painful Urination, Urgency and Urine Leakage. Musculoskeletal Present- Back Pain. Not Present- Joint Pain, Joint Stiffness, Muscle Pain, Muscle Weakness and Swelling of Extremities. Neurological Not Present- Decreased Memory, Fainting, Headaches, Numbness,  Seizures, Tingling, Tremor, Trouble walking and Weakness. Psychiatric Present- Anxiety. Not Present- Bipolar, Change in Sleep Pattern, Depression, Fearful and Frequent crying. Endocrine Not Present- Cold Intolerance, Excessive Hunger, Hair Changes, Heat Intolerance, Hot flashes  and New Diabetes. Hematology Not Present- Blood Thinners, Easy Bruising, Excessive bleeding, Gland problems, HIV and Persistent Infections.  Vitals Sabino Gasser; 03/17/2018 10:58 AM) 03/17/2018 10:57 AM Weight: 1836 lb Height: 63in Body Surface Area: 4.96 m Body Mass Index: 325.23 kg/m  Temp.: 98.3F(Oral)  Pulse: 77 (Regular)  BP: 130/80 (Sitting, Left Arm, Standard)      Physical Exam [name] MD; 03/17/2018 11:26 AM)  General Mental Status-Alert. General Appearance-Not in acute distress, Not Sickly. Orientation-Oriented X3. Hydration-Well hydrated. Voice-Normal.  Integumentary Global Assessment Upon inspection and palpation of skin surfaces of the - Axillae: non-tender, no inflammation or ulceration, no drainage. and Distribution of scalp and body hair is normal. General Characteristics Temperature - normal warmth is noted.  Head and Neck Head-normocephalic, atraumatic with no lesions or palpable masses. Face Global Assessment - atraumatic, no absence of expression. Neck Global Assessment - no abnormal movements, no bruit auscultated on the right, no bruit auscultated on the left, no decreased range of motion, non-tender. Trachea-midline. Thyroid Gland Characteristics - non-tender.  Eye Eyeball - Left-Extraocular movements intact, No Nystagmus. Eyeball - Right-Extraocular movements intact, No Nystagmus. Cornea - Left-No Hazy. Cornea - Right-No Hazy. Sclera/Conjunctiva - Left-No scleral icterus, No Discharge. Sclera/Conjunctiva - Right-No scleral icterus, No Discharge. Pupil - Left-Direct reaction to light normal. Pupil - Right-Direct  reaction to light normal. Note: Wears glasses. Vision corrected  ENMT Ears Pinna - Left - no drainage observed, no generalized tenderness observed. Right - no drainage observed, no generalized tenderness observed. Nose and Sinuses External Inspection of the Nose - no destructive lesion observed. Inspection of the nares - Left - quiet respiration. Right - quiet respiration. Mouth and Throat Lips - Upper Lip - no fissures observed, no pallor noted. Lower Lip - no fissures observed, no pallor noted. Nasopharynx - no discharge present. Oral Cavity/Oropharynx - Tongue - no dryness observed. Oral Mucosa - no cyanosis observed. Hypopharynx - no evidence of airway distress observed.  Chest and Lung Exam Inspection Movements - Normal and Symmetrical. Accessory muscles - No use of accessory muscles in breathing. Palpation Palpation of the chest reveals - Non-tender. Auscultation Breath sounds - Normal and Clear.  Cardiovascular Auscultation Rhythm - Regular. Murmurs & Other Heart Sounds - Auscultation of the heart reveals - No Murmurs and No Systolic Clicks.  Abdomen Inspection Inspection of the abdomen reveals - No Visible peristalsis and No Abnormal pulsations. Umbilicus - No Bleeding, No Urine drainage. Palpation/Percussion Palpation and Percussion of the abdomen reveal - Soft, Non Tender, No Rebound tenderness, No Rigidity (guarding) and No Cutaneous hyperesthesia. Note: Abdomen soft. Nontender. Moderate diastases recti Not distended. No umbilical or incisional hernias. No guarding.  Male Genitourinary Sexual Maturity Tanner 5 - Adult hair pattern and Adult penile size and shape. Note: Sensitivity at right external inguinal ring suspicious for hernia. Subtle impulse on right femoral/inner thigh region as well. Discomfort and sensitivity external ring on left side but no definite hernia there. Normal external male genitalia. No obvious varicocele or hydrocele to my examination. No  testicular masses.  Peripheral Vascular Upper Extremity Inspection - Left - No Cyanotic nailbeds, Not Ischemic. Right - No Cyanotic nailbeds, Not Ischemic.  Neurologic Neurologic evaluation reveals -normal attention span and ability to concentrate, able to name objects and repeat phrases. Appropriate fund of knowledge , normal sensation and normal coordination. Mental Status Affect - not angry, not paranoid. Cranial Nerves-Normal Bilaterally. Gait-Normal.  Neuropsychiatric Mental status exam performed with findings of-able to articulate well with normal speech/language, rate, volume  and coherence, thought content normal with ability to perform basic computations and apply abstract reasoning and no evidence of hallucinations, delusions, obsessions or homicidal/suicidal ideation.  Musculoskeletal Global Assessment Spine, Ribs and Pelvis - no instability, subluxation or laxity. Right Upper Extremity - no instability, subluxation or laxity.  Lymphatic Head & Neck  General Head & Neck Lymphatics: Bilateral - Description - No Localized lymphadenopathy. Axillary  General Axillary Region: Bilateral - Description - No Localized lymphadenopathy. Femoral & Inguinal  Generalized Femoral & Inguinal Lymphatics: Left - Description - No Localized lymphadenopathy. Right - Description - No Localized lymphadenopathy.    Assessment & Plan [name] MD; 03/17/2018 11:23 AM)  RIGHT INGUINAL HERNIA (K40.90) Impression: Intermittent sharp right groin pains suspicious for inguinal hernia. I feel an impulse in his right femoral region as well. Some intermittent pains on the left side but no definite hernia there.  I think he would benefit from laparoscopic exploration and repair of hernias found. He's been having intermittent symptoms for 2 years. Ultrasound was underwent down for other etiologies. The rest of his differential diagnosis for pelvic/orthopedic/gastrointestinal/kidney stone  etiologies is unlikely. He seemed hesitant to consider surgery. Wife much more convinced. In the end, he agrees that this is persistent and knee which is to consider surgical repair   GROIN PAIN, LEFT (R10.32) Impression: Some sensitivity left groin without a definite impulse. Will look over there just in case.   PREOP - ING HERNIA - ENCOUNTER FOR PREOPERATIVE EXAMINATION FOR GENERAL SURGICAL PROCEDURE (Z01.818)  Current Plans You are being scheduled for surgery- Our schedulers will call you.  You should hear from our office's scheduling department within 5 working days about the location, date, and time of surgery. We try to make accommodations for patient's preferences in scheduling surgery, but sometimes the OR schedule or the surgeon's schedule prevents [place] from making those accommodations.  If you have not heard from our office [phone]) in 5 working days, call the office and ask for your surgeon's nurse.  If you have other questions about your diagnosis, plan, or surgery, call the office and ask for your surgeon's nurse.  Written instructions provided The anatomy & physiology of the abdominal wall and pelvic floor was discussed. The pathophysiology of hernias in the inguinal and pelvic region was discussed. Natural history risks such as progressive enlargement, pain, incarceration, and strangulation was discussed. Contributors to complications such as smoking, obesity, diabetes, prior surgery, etc were discussed.  I feel the risks of no intervention will lead to serious problems that outweigh the operative risks; therefore, I recommended surgery to reduce and repair the hernia. I explained laparoscopic techniques with possible need for an open approach. I noted usual use of mesh to patch and/or buttress hernia repair  Risks such as bleeding, infection, abscess, need for further treatment, heart attack, death, and other risks were discussed. I noted a good likelihood this  will help address the problem. Goals of post-operative recovery were discussed as well. Possibility that this will not correct all symptoms was explained. I stressed the importance of low-impact activity, aggressive pain control, avoiding constipation, & not pushing through pain to minimize risk of post-operative chronic pain or injury. Possibility of reherniation was discussed. We will work to minimize complications.  An educational handout further explaining the pathology & treatment options was given as well. Questions were answered. The patient expresses understanding & wishes to proceed with surgery.  Pt Education - Pamphlet Given - Laparoscopic Hernia Repair: discussed with patient and provided information.  Pt Education - CCS Pain Control (Lakrisha Iseman) Pt Education - CCS Hernia Post-Op HCI (Evann Koelzer): discussed with patient and provided information. Pt Education - CCS Mesh education: discussed with patient and provided information.  OSA (OBSTRUCTIVE SLEEP APNEA) (G47.33) Impression: Well-controlled with nasal BiPAP. Consider bringing the mask to be used in PACU.  [name], MD, FACS, MASCRS Gastrointestinal and Minimally Invasive Surgery    [address], [place], [place] [address] [phone] Main / Paging [phone] Fax

## 2018-03-21 ENCOUNTER — Ambulatory Visit: Payer: 59 | Admitting: Family Medicine

## 2018-03-21 ENCOUNTER — Encounter: Payer: Self-pay | Admitting: Family Medicine

## 2018-03-21 VITALS — BP 118/82 | HR 73 | Temp 98.1°F | Ht 68.0 in | Wt 186.4 lb

## 2018-03-21 DIAGNOSIS — Z1159 Encounter for screening for other viral diseases: Secondary | ICD-10-CM

## 2018-03-21 DIAGNOSIS — I10 Essential (primary) hypertension: Secondary | ICD-10-CM

## 2018-03-21 DIAGNOSIS — E119 Type 2 diabetes mellitus without complications: Secondary | ICD-10-CM | POA: Diagnosis not present

## 2018-03-21 DIAGNOSIS — F411 Generalized anxiety disorder: Secondary | ICD-10-CM

## 2018-03-21 DIAGNOSIS — E785 Hyperlipidemia, unspecified: Secondary | ICD-10-CM | POA: Diagnosis not present

## 2018-03-21 DIAGNOSIS — Z114 Encounter for screening for human immunodeficiency virus [HIV]: Secondary | ICD-10-CM

## 2018-03-21 LAB — COMPREHENSIVE METABOLIC PANEL
ALK PHOS: 57 U/L (ref 39–117)
ALT: 30 U/L (ref 0–53)
AST: 17 U/L (ref 0–37)
Albumin: 4.4 g/dL (ref 3.5–5.2)
BILIRUBIN TOTAL: 0.5 mg/dL (ref 0.2–1.2)
BUN: 19 mg/dL (ref 6–23)
CALCIUM: 9.4 mg/dL (ref 8.4–10.5)
CO2: 29 meq/L (ref 19–32)
Chloride: 99 mEq/L (ref 96–112)
Creatinine, Ser: 0.96 mg/dL (ref 0.40–1.50)
GFR: 86.7 mL/min (ref >60.00)
GLUCOSE: 182 mg/dL — AB (ref 70–99)
Potassium: 4.6 mEq/L (ref 3.5–5.1)
Sodium: 137 mEq/L (ref 135–145)
TOTAL PROTEIN: 6.8 g/dL (ref 6.0–8.3)

## 2018-03-21 LAB — LIPID PANEL
Cholesterol: 207 mg/dL — ABNORMAL HIGH (ref 0–200)
HDL: 35.8 mg/dL — AB (ref >39.00)
TRIGLYCERIDES: 534 mg/dL — AB (ref 0.0–149.0)
Total CHOL/HDL Ratio: 6

## 2018-03-21 LAB — HEMOGLOBIN A1C: Hgb A1c MFr Bld: 7.6 % — ABNORMAL HIGH (ref 4.6–6.5)

## 2018-03-21 LAB — LDL CHOLESTEROL, DIRECT: Direct LDL: 84 mg/dL

## 2018-03-21 MED ORDER — BUSPIRONE HCL 7.5 MG PO TABS
7.5000 mg | ORAL_TABLET | Freq: Two times a day (BID) | ORAL | 2 refills | Status: DC
Start: 2018-03-21 — End: 2018-07-21

## 2018-03-21 MED ORDER — FLUVASTATIN SODIUM 40 MG PO CAPS: 40.0000 mg | ORAL_CAPSULE | Freq: Every day | ORAL | 1 refills | Status: DC

## 2018-03-21 MED ORDER — METFORMIN HCL 500 MG PO TABS: 1000.0000 mg | ORAL_TABLET | Freq: Every day | ORAL | 1 refills | Status: DC

## 2018-03-21 MED ORDER — VALSARTAN 80 MG PO TABS: 80.0000 mg | ORAL_TABLET | Freq: Every day | ORAL | 1 refills | Status: DC

## 2018-03-21 MED FILL — busPIRone HCL 7.5 MG TABS: 7.5 | 30 days supply | Qty: 60 | Fill #0

## 2018-03-21 NOTE — Patient Instructions (Addendum)
We will be in touch regarding your labs.   Please consider counseling. Contact [phone] to schedule an appointment or inquire about cost/insurance coverage.   Let us know if you need anything.

## 2018-03-21 NOTE — Progress Notes (Signed)
Subjective:   Chief Complaint  Patient presents with  . Follow-up    Shawn Perez is a 52 y.o. male here for follow-up of diabetes.   Markey's self monitored glucose range is low 100's.  Patient denies hypoglycemic reactions. He checks his glucose levels intermittently. Patient does not require insulin.   Medications include: Metformin 1000 mg bid Reports diet is healthy overall. Patient exercises rarely  Hypertension Patient presents for hypertension follow up. He does monitor home blood pressures. Blood pressures ranging on average from 110's/60-70's. He is compliant with medications- Valsartan 80 mg/d. Patient has these side effects of medication: none He is adhering to a healthy diet overall. Exercise: rare  GAD Has been taking Prozac, stopped because he was having more irritability. Feels that is has been getting better after stopping over past 1.5 weeks. Feeling more hazy and low attention.   Past Medical History:  Diagnosis Date  . Diabetes mellitus without complication ([place])   . Heart murmur    since birth  . Hyperlipidemia 2011  . Hypertension   . Temporomandibular jaw dysfunction     Past Surgical History:  Procedure Laterality Date  . NO PAST SURGERIES  07/09/2011   Denies surgical history  . WISDOM TOOTH EXTRACTION      Family History  Problem Relation Age of Onset  . Hypertension Mother   . Diabetes Mother   . Early death Mother   . Heart disease Mother   . Heart disease Sister   . Early death Brother   . Heart disease Brother   . Hyperlipidemia Brother   . Hypertension Brother   . Early death Maternal Aunt   . Heart disease Maternal Aunt   . Early death Maternal Uncle   . Heart disease Maternal Uncle   . Heart disease Maternal Grandmother   . Early death Maternal Grandmother   . Heart disease Brother    Current Outpatient Medications on File Prior to Visit  Medication Sig Dispense Refill  . Blood Glucose Monitoring Suppl (ONE TOUCH ULTRA  SYSTEM KIT) W/DEVICE KIT 1 kit by Does not apply route once. 1 each 0  . FLUoxetine (PROZAC) 20 MG capsule TAKE 1 CAPSULE (20 MG TOTAL) BY MOUTH DAILY. 30 capsule 0  . fluvastatin (LESCOL) 40 MG capsule TAKE 1 CAPSULE (40 MG TOTAL) BY MOUTH DAILY. PATIENT NEEDS OFFICE VISIT PRIOR TO ANYMORE REFILLS. 30 capsule 0  . Lancets (ONETOUCH ULTRASOFT) lancets Use as instructed 100 each 12  . latanoprost (XALATAN) 0.005 % ophthalmic solution Place 1 drop into both eyes at bedtime.     . metFORMIN (GLUCOPHAGE) 500 MG tablet Take 2 tablets (1,000 mg total) by mouth daily with breakfast. 180 tablet 1  . ONE TOUCH ULTRA TEST test strip TEST 1 TIME DAILY. 100 each 3  . valsartan (DIOVAN) 80 MG tablet TAKE 1 TABLET (80 MG TOTAL) BY MOUTH DAILY. 30 tablet 0  . vitamin B-12 (CYANOCOBALAMIN) 1000 MCG tablet TAKE 1 TABLET BY MOUTH DAILY FOR SEVEN DAYS, THEN 1 TABLET WEEKLY THERAFTER 130 tablet 0   Related testing: Date of retinal exam: 2 years Pneumovax: done  Review of Systems: Pulmonary:  No SOB Cardiovascular:  No chest pain  Objective:  BP 118/82 (BP Location: Left Arm, Patient Position: Sitting, Cuff Size: Large)   Pulse 73   Temp 98.1 F (36.7 C) (Oral)   Ht _0  (1.727 m)   Wt 186 lb 6 oz (84.5 kg)   SpO2 96%   BMI 28.34 kg/m  General:  Well developed, well nourished, in no apparent distress Skin:  Warm, no pallor or diaphoresis Head:  Normocephalic, atraumatic Eyes:  Pupils equal and round, sclera anicteric without injection  Lungs:  CTAB, no access msc use Cardio:  RRR, no bruits, no LE edema Musculoskeletal:  Symmetrical muscle groups noted without atrophy or deformity Neuro:  Sensation intact to pinprick on feet Psych: Age appropriate judgment and insight  Assessment:   Type 2 diabetes mellitus without complication, without long-term current use of insulin (HCC) - Plan: Hemoglobin A1c, HM Diabetes Foot Exam, metFORMIN (GLUCOPHAGE) 500 MG tablet, Comprehensive metabolic panel, Lipid  panel  Generalized anxiety disorder - Plan: busPIRone (BUSPAR) 7.5 MG tablet  Hyperlipidemia, unspecified hyperlipidemia type - Plan: fluvastatin (LESCOL) 40 MG capsule  Essential hypertension - Plan: valsartan (DIOVAN) 80 MG tablet  Screening for HIV (human immunodeficiency virus)  Encounter for hepatitis C screening test for low risk patient   Plan:   Orders as above. DM- cont care GAD- stop SSRI, start BuSpar. If no improvement, may want to try alternative SSRI like Lexapro. # for counseling provided. HTN- cont Diovan.  Counseled on diet and exercise. F/u in 3 mo. The patient voiced understanding and agreement to the plan.  [place], DO 03/21/18 10:06 AM

## 2018-04-02 NOTE — Patient Instructions (Signed)
Shawn Perez  04/02/2018   Your procedure is scheduled on: 04-09-18  Report to [facility] Main  Entrance  Report to admitting at   0830 AM    Call this number if you have problems the morning of surgery [phone]   Remember:   NO SOLID FOOD AFTER MIDNIGHT THE NIGHT PRIOR TO SURGERY. NOTHING BY MOUTH EXCEPT CLEAR LIQUIDS UNTIL 3 HOURS   PRIOR TO [place] SURGERY. PLEASE FINISH ENSURE DRINK PER SURGEON ORDER 3 HOURS PRIOR TO SCHEDULED SURGERY TIME   WHICH NEEDS TO BE COMPLETED AT __0530 am__________.    CLEAR LIQUID DIET   Foods Allowed                                                                     Foods Excluded  Coffee and tea, regular and decaf                             liquids that you cannot  Plain Jell-O in any flavor                                             see through such as: Fruit ices (not with fruit pulp)                                     milk, soups, orange juice  Iced Popsicles                                    All solid food Carbonated beverages, regular and diet                                    Cranberry, grape and apple juices Sports drinks like Gatorade Lightly seasoned clear broth or consume(fat free) Sugar, honey syrup  _____________________________________________________________________    Take these medicines the morning of surgery with A SIP OF WATER: buspar, fluvastatin(lescol) DO NOT TAKE ANY DIABETIC MEDICATIONS DAY OF YOUR SURGERY                               You may not have any metal on your body including hair pins and              piercings  Do not wear jewelry,lotions, powders or perfumes, deodorant                     Men may shave face and neck.   Do not bring valuables to the hospital. [place].  Contacts, dentures or bridgework may not be worn into surgery.  Patients discharged the day of surgery will not be allowed to drive home.  Name  and phone number of your driver:  Special Instructions: N/A              Please read over the following fact sheets you were given: _____________________________________________________________________          [facility] - Preparing for Surgery Before surgery, you can play an important role.  Because skin is not sterile, your skin needs to be as free of germs as possible.  You can reduce the number of germs on your skin by washing with CHG (chlorahexidine gluconate) soap before surgery.  CHG is an antiseptic cleaner which kills germs and bonds with the skin to continue killing germs even after washing. Please DO NOT use if you have an allergy to CHG or antibacterial soaps.  If your skin becomes reddened/irritated stop using the CHG and inform your nurse when you arrive at Short Stay. Do not shave (including legs and underarms) for at least 48 hours prior to the first CHG shower.  You may shave your face/neck. Please follow these instructions carefully:  1.  Shower with CHG Soap the night before surgery and the  morning of Surgery.  2.  If you choose to wash your hair, wash your hair first as usual with your  normal  shampoo.  3.  After you shampoo, rinse your hair and body thoroughly to remove the  shampoo.                           4.  Use CHG as you would any other liquid soap.  You can apply chg directly  to the skin and wash                       Gently with a scrungie or clean washcloth.  5.  Apply the CHG Soap to your body ONLY FROM THE NECK DOWN.   Do not use on face/ open                           Wound or open sores. Avoid contact with eyes, ears mouth and genitals (private parts).                       Wash face,  Genitals (private parts) with your normal soap.             6.  Wash thoroughly, paying special attention to the area where your surgery  will be performed.  7.  Thoroughly rinse your body with warm water from the neck down.  8.  DO NOT shower/wash with your normal soap  after using and rinsing off  the CHG Soap.                9.  Pat yourself dry with a clean towel.            10.  Wear clean pajamas.            11.  Place clean sheets on your bed the night of your first shower and do not  sleep with pets. Day of Surgery : Do not apply any lotions/deodorants the morning of surgery.  Please wear clean clothes to the hospital/surgery center.  FAILURE TO FOLLOW THESE INSTRUCTIONS MAY RESULT IN THE CANCELLATION OF YOUR SURGERY PATIENT SIGNATURE_________________________________  NURSE SIGNATURE__________________________________  ________________________________________________________________________  

## 2018-04-03 ENCOUNTER — Encounter (HOSPITAL_COMMUNITY)
Admission: RE | Admit: 2018-04-03 | Discharge: 2018-04-03 | Disposition: A | Discharge status: Home / Self Care | Payer: 59 | Source: Ambulatory Visit | Attending: Surgery | Admitting: Surgery

## 2018-04-03 ENCOUNTER — Other Ambulatory Visit: Payer: Self-pay

## 2018-04-03 ENCOUNTER — Encounter (HOSPITAL_COMMUNITY): Payer: Self-pay

## 2018-04-03 DIAGNOSIS — K4020 Bilateral inguinal hernia, without obstruction or gangrene, not specified as recurrent: Secondary | ICD-10-CM | POA: Diagnosis not present

## 2018-04-03 DIAGNOSIS — Z01810 Encounter for preprocedural cardiovascular examination: Secondary | ICD-10-CM | POA: Insufficient documentation

## 2018-04-03 DIAGNOSIS — Z01812 Encounter for preprocedural laboratory examination: Secondary | ICD-10-CM | POA: Insufficient documentation

## 2018-04-03 HISTORY — DX: Family history of other specified conditions: Z84.89

## 2018-04-03 HISTORY — DX: Sleep apnea, unspecified: G47.30

## 2018-04-03 HISTORY — DX: Anxiety disorder, unspecified: F41.9

## 2018-04-03 LAB — CBC
HEMATOCRIT: 45.5 % (ref 39.0–52.0)
HEMOGLOBIN: 15.6 g/dL (ref 13.0–17.0)
MCH: 32.4 pg (ref 26.0–34.0)
MCHC: 34.3 g/dL (ref 30.0–36.0)
MCV: 94.6 fL (ref 78.0–100.0)
Platelets: 264 10*3/uL (ref 150–400)
RBC: 4.81 MIL/uL (ref 4.22–5.81)
RDW: 12.6 % (ref 11.5–15.5)
WBC: 5.9 10*3/uL (ref 4.0–10.5)

## 2018-04-07 MED FILL — VALSARTAN 80 MG TABLET: 80 | 30 days supply | Qty: 30 | Fill #0

## 2018-04-07 MED FILL — FLUVASTATIN NA 40 MG CAP: 40 | 30 days supply | Qty: 30 | Fill #0

## 2018-04-09 ENCOUNTER — Ambulatory Visit (HOSPITAL_COMMUNITY)
Admission: RE | Admit: 2018-04-09 | Discharge: 2018-04-09 | Disposition: A | Discharge status: Home / Self Care | Payer: 59 | Source: Ambulatory Visit | Attending: Surgery | Admitting: Surgery

## 2018-04-09 ENCOUNTER — Ambulatory Visit (HOSPITAL_COMMUNITY): Payer: 59 | Admitting: Anesthesiology

## 2018-04-09 ENCOUNTER — Encounter (HOSPITAL_COMMUNITY): Payer: Self-pay

## 2018-04-09 ENCOUNTER — Encounter (HOSPITAL_COMMUNITY)
Admission: RE | Disposition: A | Discharge status: Home / Self Care | Payer: Self-pay | Source: Ambulatory Visit | Attending: Surgery

## 2018-04-09 DIAGNOSIS — F419 Anxiety disorder, unspecified: Secondary | ICD-10-CM | POA: Diagnosis not present

## 2018-04-09 DIAGNOSIS — Z880 Allergy status to penicillin: Secondary | ICD-10-CM | POA: Diagnosis not present

## 2018-04-09 DIAGNOSIS — Z833 Family history of diabetes mellitus: Secondary | ICD-10-CM | POA: Insufficient documentation

## 2018-04-09 DIAGNOSIS — I10 Essential (primary) hypertension: Secondary | ICD-10-CM | POA: Insufficient documentation

## 2018-04-09 DIAGNOSIS — R011 Cardiac murmur, unspecified: Secondary | ICD-10-CM | POA: Insufficient documentation

## 2018-04-09 DIAGNOSIS — E7800 Pure hypercholesterolemia, unspecified: Secondary | ICD-10-CM | POA: Diagnosis not present

## 2018-04-09 DIAGNOSIS — K4190 Unilateral femoral hernia, without obstruction or gangrene, not specified as recurrent: Secondary | ICD-10-CM | POA: Diagnosis not present

## 2018-04-09 DIAGNOSIS — E119 Type 2 diabetes mellitus without complications: Secondary | ICD-10-CM | POA: Diagnosis not present

## 2018-04-09 DIAGNOSIS — K219 Gastro-esophageal reflux disease without esophagitis: Secondary | ICD-10-CM | POA: Diagnosis not present

## 2018-04-09 DIAGNOSIS — Z8249 Family history of ischemic heart disease and other diseases of the circulatory system: Secondary | ICD-10-CM | POA: Diagnosis not present

## 2018-04-09 DIAGNOSIS — K4090 Unilateral inguinal hernia, without obstruction or gangrene, not specified as recurrent: Secondary | ICD-10-CM | POA: Insufficient documentation

## 2018-04-09 DIAGNOSIS — Z87891 Personal history of nicotine dependence: Secondary | ICD-10-CM | POA: Insufficient documentation

## 2018-04-09 DIAGNOSIS — G4730 Sleep apnea, unspecified: Secondary | ICD-10-CM | POA: Diagnosis not present

## 2018-04-09 DIAGNOSIS — Z79899 Other long term (current) drug therapy: Secondary | ICD-10-CM | POA: Insufficient documentation

## 2018-04-09 DIAGNOSIS — K4130 Unilateral femoral hernia, with obstruction, without gangrene, not specified as recurrent: Secondary | ICD-10-CM | POA: Diagnosis not present

## 2018-04-09 DIAGNOSIS — K4120 Bilateral femoral hernia, without obstruction or gangrene, not specified as recurrent: Secondary | ICD-10-CM | POA: Diagnosis not present

## 2018-04-09 DIAGNOSIS — D176 Benign lipomatous neoplasm of spermatic cord: Secondary | ICD-10-CM | POA: Insufficient documentation

## 2018-04-09 HISTORY — PX: INSERTION OF MESH: SHX5868

## 2018-04-09 HISTORY — PX: INGUINAL HERNIA REPAIR: SHX194

## 2018-04-09 LAB — GLUCOSE, CAPILLARY
GLUCOSE-CAPILLARY: 156 mg/dL — AB (ref 70–99)
Glucose-Capillary: 300 mg/dL — ABNORMAL HIGH (ref 70–99)

## 2018-04-09 SURGERY — REPAIR, HERNIA, INGUINAL, LAPAROSCOPIC
Anesthesia: General

## 2018-04-09 MED ORDER — LACTATED RINGERS IR SOLN
Status: DC | PRN
  Administered 2018-04-09: 1000 mL

## 2018-04-09 MED ORDER — GABAPENTIN 300 MG PO CAPS
300.0000 mg | ORAL_CAPSULE | ORAL | Status: AC
  Administered 2018-04-09: 300 mg via ORAL
  Filled 2018-04-09: qty 1

## 2018-04-09 MED ORDER — PROPOFOL 10 MG/ML IV BOLUS
INTRAVENOUS | Status: AC
  Filled 2018-04-09: qty 20

## 2018-04-09 MED ORDER — MIDAZOLAM HCL 5 MG/5ML IJ SOLN
INTRAMUSCULAR | Status: DC | PRN
  Administered 2018-04-09: 2 mg via INTRAVENOUS

## 2018-04-09 MED ORDER — ONDANSETRON HCL 4 MG/2ML IJ SOLN
INTRAMUSCULAR | Status: AC
  Filled 2018-04-09: qty 2

## 2018-04-09 MED ORDER — ACETAMINOPHEN 500 MG PO TABS
1000.0000 mg | ORAL_TABLET | ORAL | Status: AC
  Administered 2018-04-09: 1000 mg via ORAL
  Filled 2018-04-09: qty 2

## 2018-04-09 MED ORDER — HYDRALAZINE HCL 20 MG/ML IJ SOLN
5.0000 mg | Freq: Once | INTRAMUSCULAR | Status: AC
  Administered 2018-04-09: 5 mg via INTRAVENOUS

## 2018-04-09 MED ORDER — STERILE WATER FOR IRRIGATION IR SOLN
Status: DC | PRN
  Administered 2018-04-09: 1000 mL

## 2018-04-09 MED ORDER — TRAMADOL HCL 50 MG PO TABS
50.0000 mg | ORAL_TABLET | Freq: Once | ORAL | Status: AC
  Administered 2018-04-09: 50 mg via ORAL

## 2018-04-09 MED ORDER — MIDAZOLAM HCL 2 MG/2ML IJ SOLN
INTRAMUSCULAR | Status: AC
  Filled 2018-04-09: qty 2

## 2018-04-09 MED ORDER — CELECOXIB 200 MG PO CAPS
200.0000 mg | ORAL_CAPSULE | ORAL | Status: AC
  Administered 2018-04-09: 200 mg via ORAL
  Filled 2018-04-09: qty 1

## 2018-04-09 MED ORDER — CHLORHEXIDINE GLUCONATE CLOTH 2 % EX PADS: 6.0000 | MEDICATED_PAD | Freq: Once | CUTANEOUS | Status: DC

## 2018-04-09 MED ORDER — LIDOCAINE 20MG/ML (2%) 15 ML SYRINGE OPTIME
INTRAMUSCULAR | Status: DC | PRN
  Administered 2018-04-09: 1.5 mg/kg/h via INTRAVENOUS

## 2018-04-09 MED ORDER — SUFENTANIL CITRATE 50 MCG/ML IV SOLN
INTRAVENOUS | Status: DC | PRN
  Administered 2018-04-09 (×5): 10 ug via INTRAVENOUS

## 2018-04-09 MED ORDER — FENTANYL CITRATE (PF) 100 MCG/2ML IJ SOLN: 25.0000 ug | INTRAMUSCULAR | Status: DC | PRN

## 2018-04-09 MED ORDER — PROMETHAZINE HCL 25 MG/ML IJ SOLN
INTRAMUSCULAR | Status: AC
  Administered 2018-04-09: 12.5 mg via INTRAVENOUS
  Filled 2018-04-09: qty 1

## 2018-04-09 MED ORDER — PROMETHAZINE HCL 25 MG/ML IJ SOLN
6.2500 mg | INTRAMUSCULAR | Status: DC | PRN
  Administered 2018-04-09: 12.5 mg via INTRAVENOUS

## 2018-04-09 MED ORDER — BUPIVACAINE-EPINEPHRINE 0.25% -1:200000 IJ SOLN
INTRAMUSCULAR | Status: DC | PRN
  Administered 2018-04-09: 80 mL

## 2018-04-09 MED ORDER — SUGAMMADEX SODIUM 200 MG/2ML IV SOLN
INTRAVENOUS | Status: DC | PRN
  Administered 2018-04-09: 200 mg via INTRAVENOUS

## 2018-04-09 MED ORDER — CLINDAMYCIN PHOSPHATE 900 MG/50ML IV SOLN
900.0000 mg | INTRAVENOUS | Status: AC
  Administered 2018-04-09: 900 mg via INTRAVENOUS
  Filled 2018-04-09: qty 50

## 2018-04-09 MED ORDER — ROCURONIUM BROMIDE 10 MG/ML (PF) SYRINGE
PREFILLED_SYRINGE | INTRAVENOUS | Status: DC | PRN
  Administered 2018-04-09: 70 mg via INTRAVENOUS
  Administered 2018-04-09: 20 mg via INTRAVENOUS

## 2018-04-09 MED ORDER — TRAMADOL HCL 50 MG PO TABS
ORAL_TABLET | ORAL | Status: AC
  Administered 2018-04-09: 50 mg via ORAL
  Filled 2018-04-09: qty 1

## 2018-04-09 MED ORDER — GENTAMICIN IN SALINE 1-0.9 MG/ML-% IV SOLN: 100.0000 mg | INTRAVENOUS | Status: DC

## 2018-04-09 MED ORDER — 0.9 % SODIUM CHLORIDE (POUR BTL) OPTIME
TOPICAL | Status: DC | PRN
  Administered 2018-04-09: 1000 mL

## 2018-04-09 MED ORDER — KETAMINE HCL 10 MG/ML IJ SOLN
INTRAMUSCULAR | Status: DC | PRN
  Administered 2018-04-09: 40 mg via INTRAVENOUS

## 2018-04-09 MED ORDER — TRAMADOL HCL 50 MG PO TABS: 50.0000 mg | ORAL_TABLET | Freq: Four times a day (QID) | ORAL | 0 refills | Status: DC | PRN

## 2018-04-09 MED ORDER — LACTATED RINGERS IV SOLN
INTRAVENOUS | Status: DC
  Administered 2018-04-09 (×2): via INTRAVENOUS
  Administered 2018-04-09: 1000 mL via INTRAVENOUS

## 2018-04-09 MED ORDER — DEXTROSE 5 % IV SOLN
430.0000 mg | INTRAVENOUS | Status: AC
  Administered 2018-04-09: 430 mg via INTRAVENOUS
  Filled 2018-04-09: qty 10.75

## 2018-04-09 MED ORDER — ONDANSETRON HCL 4 MG/2ML IJ SOLN
INTRAMUSCULAR | Status: AC
  Administered 2018-04-09: 4 mg via INTRAVENOUS
  Filled 2018-04-09: qty 2

## 2018-04-09 MED ORDER — SUFENTANIL CITRATE 50 MCG/ML IV SOLN
INTRAVENOUS | Status: AC
  Filled 2018-04-09: qty 1

## 2018-04-09 MED ORDER — FENTANYL CITRATE (PF) 100 MCG/2ML IJ SOLN: INTRAMUSCULAR | Status: DC | PRN

## 2018-04-09 MED ORDER — DEXAMETHASONE SODIUM PHOSPHATE 10 MG/ML IJ SOLN
INTRAMUSCULAR | Status: AC
  Filled 2018-04-09: qty 1

## 2018-04-09 MED ORDER — HYDRALAZINE HCL 20 MG/ML IJ SOLN
INTRAMUSCULAR | Status: AC
  Filled 2018-04-09: qty 1

## 2018-04-09 MED ORDER — MEPERIDINE HCL 50 MG/ML IJ SOLN: 6.2500 mg | INTRAMUSCULAR | Status: DC | PRN

## 2018-04-09 MED ORDER — LIDOCAINE 2% (20 MG/ML) 5 ML SYRINGE
INTRAMUSCULAR | Status: DC | PRN
  Administered 2018-04-09: 75 mg via INTRAVENOUS
  Administered 2018-04-09: 25 mg via INTRAVENOUS

## 2018-04-09 MED ORDER — ONDANSETRON HCL 4 MG/2ML IJ SOLN
4.0000 mg | Freq: Once | INTRAMUSCULAR | Status: AC
  Administered 2018-04-09: 4 mg via INTRAVENOUS

## 2018-04-09 MED ORDER — PROPOFOL 10 MG/ML IV BOLUS
INTRAVENOUS | Status: DC | PRN
  Administered 2018-04-09: 180 mg via INTRAVENOUS

## 2018-04-09 MED ORDER — BUPIVACAINE-EPINEPHRINE (PF) 0.25% -1:200000 IJ SOLN
INTRAMUSCULAR | Status: AC
  Filled 2018-04-09: qty 90

## 2018-04-09 MED FILL — traMADol HCL 50 MG TABS: 50 | 4 days supply | Qty: 30 | Fill #0

## 2018-04-09 SURGICAL SUPPLY — 35 items
CABLE HIGH FREQUENCY MONO STRZ (ELECTRODE) ×2 IMPLANT
CHLORAPREP W/TINT 26ML (MISCELLANEOUS) ×2 IMPLANT
COVER SURGICAL LIGHT HANDLE (MISCELLANEOUS) ×2 IMPLANT
DECANTER SPIKE VIAL GLASS SM (MISCELLANEOUS) ×2 IMPLANT
DEVICE SECURE STRAP 25 ABSORB (INSTRUMENTS) IMPLANT
DRAPE WARM FLUID 44X44 (DRAPE) ×2 IMPLANT
DRSG TEGADERM 2-3/8X2-3/4 SM (GAUZE/BANDAGES/DRESSINGS) ×4 IMPLANT
DRSG TEGADERM 4X4.75 (GAUZE/BANDAGES/DRESSINGS) ×2 IMPLANT
ELECT REM PT RETURN 15FT ADLT (MISCELLANEOUS) ×2 IMPLANT
GAUZE SPONGE 2X2 8PLY STRL LF (GAUZE/BANDAGES/DRESSINGS) ×1 IMPLANT
GLOVE ECLIPSE 8.0 STRL XLNG CF (GLOVE) ×2 IMPLANT
GLOVE INDICATOR 8.0 STRL GRN (GLOVE) ×2 IMPLANT
GOWN STRL REUS W/TWL XL LVL3 (GOWN DISPOSABLE) ×4 IMPLANT
IRRIG SUCT STRYKERFLOW 2 WTIP (MISCELLANEOUS) ×2
IRRIGATION SUCT STRKRFLW 2 WTP (MISCELLANEOUS) ×1 IMPLANT
KIT BASIN OR (CUSTOM PROCEDURE TRAY) ×2 IMPLANT
MESH HERNIA 6X6 BARD (Mesh General) ×3 IMPLANT
MESH HERNIA BARD 6X6 (Mesh General) ×3 IMPLANT
NEEDLE INSUFFLATION 14GA 120MM (NEEDLE) IMPLANT
PAD POSITIONING PINK XL (MISCELLANEOUS) ×2 IMPLANT
SCISSORS LAP 5X35 DISP (ENDOMECHANICALS) ×2 IMPLANT
SLEEVE ADV FIXATION 5X100MM (TROCAR) ×2 IMPLANT
SPONGE GAUZE 2X2 STER 10/PKG (GAUZE/BANDAGES/DRESSINGS) ×1
SUT MNCRL AB 4-0 PS2 18 (SUTURE) ×4 IMPLANT
SUT PDS AB 1 CT1 27 (SUTURE) ×4 IMPLANT
SUT VIC AB 2-0 SH 27 (SUTURE) ×1
SUT VIC AB 2-0 SH 27X BRD (SUTURE) ×1 IMPLANT
SUT VICRYL 0 UR6 27IN ABS (SUTURE) ×2 IMPLANT
TACKER 5MM HERNIA 3.5CML NAB (ENDOMECHANICALS) IMPLANT
TOWEL OR 17X26 10 PK STRL BLUE (TOWEL DISPOSABLE) ×2 IMPLANT
TOWEL OR NON WOVEN STRL DISP B (DISPOSABLE) ×2 IMPLANT
TRAY LAPAROSCOPIC (CUSTOM PROCEDURE TRAY) ×2 IMPLANT
TROCAR ADV FIXATION 5X100MM (TROCAR) ×2 IMPLANT
TROCAR XCEL BLUNT TIP 100MML (ENDOMECHANICALS) ×2 IMPLANT
TUBING INSUF HEATED (TUBING) ×2 IMPLANT

## 2018-04-09 NOTE — Transfer of Care (Signed)
Immediate Anesthesia Transfer of Care Note  Patient: Shawn Perez  Procedure(s) Performed: LAPAROSCOPIC RIGHT INGUINAL AND BILATERAL FEMORAL HERNIA REPAIR (N/A ) INSERTION OF MESH (N/A )  Patient Location: PACU  Anesthesia Type:General  Level of Consciousness: sedated, patient cooperative and responds to stimulation  Airway & Oxygen Therapy: Patient Spontanous Breathing and Patient connected to face mask oxygen  Post-op Assessment: Report given to RN and Post -op Vital signs reviewed and stable  Post vital signs: Reviewed and stable  Last Vitals:  Vitals Value Taken Time  BP    Temp    Pulse 88 04/09/2018 12:18 PM  Resp 8 04/09/2018 12:18 PM  SpO2 99 % 04/09/2018 12:18 PM  Vitals shown include unvalidated device data.  Last Pain:  Vitals:   04/09/18 1217  TempSrc:   PainSc: (P) 0-No pain      Patients Stated Pain Goal: 4 (35/46/56 8127)  Complications: No apparent anesthesia complications

## 2018-04-09 NOTE — Anesthesia Preprocedure Evaluation (Addendum)
Anesthesia Evaluation  Patient identified by MRN, date of birth, ID band Patient awake    Reviewed: Allergy & Precautions, NPO status , Patient's Chart, lab work & pertinent test results  History of Anesthesia Complications (+) Family history of anesthesia reaction  Airway Mallampati: II  TM Distance: >3 FB Neck ROM: Full    Dental  (+) Dental Advisory Given   Pulmonary sleep apnea , former smoker,    Pulmonary exam normal breath sounds clear to auscultation       Cardiovascular hypertension, Pt. on medications Normal cardiovascular exam+ Valvular Problems/Murmurs  Rhythm:Regular Rate:Normal     Neuro/Psych negative neurological ROS  negative psych ROS   GI/Hepatic Neg liver ROS, GERD  ,  Endo/Other  diabetes, Type 2  Renal/GU negative Renal ROS     Musculoskeletal negative musculoskeletal ROS (+)   Abdominal   Peds  Hematology negative hematology ROS (+)   Anesthesia Other Findings   Reproductive/Obstetrics                            Anesthesia Physical Anesthesia Plan  ASA: II  Anesthesia Plan: General   Post-op Pain Management:    Induction: Intravenous  PONV Risk Score and Plan: 2 and Ondansetron, Dexamethasone and Treatment may vary due to age or medical condition  Airway Management Planned: Oral ETT  Additional Equipment:   Intra-op Plan:   Post-operative Plan: Extubation in OR  Informed Consent: I have reviewed the patients History and Physical, chart, labs and discussed the procedure including the risks, benefits and alternatives for the proposed anesthesia with the patient or authorized representative who has indicated his/her understanding and acceptance.   Dental advisory given  Plan Discussed with: CRNA  Anesthesia Plan Comments:         Anesthesia Quick Evaluation

## 2018-04-09 NOTE — Anesthesia Postprocedure Evaluation (Signed)
Anesthesia Post Note  Patient: Shawn Perez  Procedure(s) Performed: LAPAROSCOPIC RIGHT INGUINAL AND BILATERAL FEMORAL HERNIA REPAIR (N/A ) INSERTION OF MESH (N/A )     Patient location during evaluation: PACU Anesthesia Type: General Level of consciousness: sedated and patient cooperative Pain management: pain level controlled Vital Signs Assessment: post-procedure vital signs reviewed and stable Respiratory status: spontaneous breathing Cardiovascular status: stable Anesthetic complications: no    Last Vitals:  Vitals:   04/09/18 1255 04/09/18 1300  BP: (!) 165/77 (!) 163/91  Pulse:  86  Resp:  15  Temp:  37 C  SpO2:  100%    Last Pain:  Vitals:   04/09/18 1245  TempSrc:   PainSc: 0-No pain                 Nolon Nations

## 2018-04-09 NOTE — Interval H&P Note (Signed)
History and Physical Interval Note:  04/09/2018 8:31 AM  Shawn Perez  has presented today for surgery, with the diagnosis of Right and possible left inguinal hernias  The various methods of treatment have been discussed with the patient and family. After consideration of risks, benefits and other options for treatment, the patient has consented to  Procedure(s): LAPAROSCOPIC RIGHT AND POSSIBLE LEFT INGUINAL HERNIA REPAIR (N/A) INSERTION OF MESH (N/A) as a surgical intervention .  The patient's history has been reviewed, patient examined, no change in status, stable for surgery.  I have reviewed the patient's chart and labs.  Questions were answered to the patient's satisfaction.    I have re-reviewed the the patient's records, history, medications, and allergies.  I have re-examined the patient.  I again discussed intraoperative plans and goals of post-operative recovery.  The patient agrees to proceed.  Shawn Perez  12/13/1965 [id number]  Patient Care Team: Shelda Pal, DO as PCP - General (Family Medicine) Michael Boston, MD as Consulting Physician (General Surgery) McKenzie, Candee Furbish, MD as Consulting Physician (Urology)  Patient Active Problem List   Diagnosis Date Noted  . Overweight (BMI 25.0-29.9) 08/08/2017  . B12 deficiency 05/02/2016  . Generalized anxiety disorder 05/02/2016  . Type 2 diabetes mellitus without complication, without long-term current use of insulin ([place]) 05/02/2016  . Chronic right-sided low back pain without sciatica 05/19/2015  . Sleep apnea 03/02/2015  . Vitamin D deficiency 07/02/2014  . Elevated platelet count 07/02/2014  . Family history of premature CAD 04/03/2014  . Open-angle glaucoma 09/02/2013  . Hyperlipidemia LDL goal <70 07/22/2013  . Anxiety 03/14/2012  . GERD (gastroesophageal reflux disease) 03/14/2012  . Essential hypertension 07/11/2011    Past Medical History:  Diagnosis Date  . Anxiety   . Diabetes mellitus  without complication ([place])    type 2  . Family history of adverse reaction to anesthesia    Shawn Perez has trouble waking up both times under anesthesia diagnosed with sleep apnea  . Heart murmur    since birth  . Hyperlipidemia 2011  . Hypertension   . Sleep apnea    wears cpap  . Temporomandibular jaw dysfunction     Past Surgical History:  Procedure Laterality Date  . COLONOSCOPY    . NO PAST SURGERIES  07/09/2011   Denies surgical history  . WISDOM TOOTH EXTRACTION      Social History   Socioeconomic History  . Marital status: Married    Spouse name: Shawn Perez  . Number of children: Not on file  . Years of education: Not on file  . Highest education level: Not on file  Occupational History    Employer: [employer]  Social Needs  . Financial resource strain: Patient refused  . Food insecurity:    Worry: Patient refused    Inability: Patient refused  . Transportation needs:    Medical: No    Non-medical: No  Tobacco Use  . Smoking status: Former Research scientist (life sciences)  . Smokeless tobacco: Never Used  . Tobacco comment: quit in 2000 smike 1 ppd  Substance and Sexual Activity  . Alcohol use: Yes    Alcohol/week: 2.0 - 3.0 standard drinks    Types: 2 - 3 Glasses of wine per week    Comment: daily  . Drug use: No    Comment: cbd oil  . Sexual activity: Yes  Lifestyle  . Physical activity:    Days per week: 0 days    Minutes per session: 0  min  . Stress: Patient refused  Relationships  . Social connections:    Talks on phone: Not on file    Gets together: Not on file    Attends religious service: Not on file    Active member of club or organization: Not on file    Attends meetings of clubs or organizations: Not on file    Relationship status: Married  . Intimate partner violence:    Fear of current or ex partner: Patient refused    Emotionally abused: Patient refused    Physically abused: Patient refused    Forced sexual activity: Patient refused  Other Topics Concern   . Not on file  Social History Narrative  . Not on file    Family History  Problem Relation Age of Onset  . Hypertension Mother   . Diabetes Mother   . Early death Mother   . Heart disease Mother   . Heart disease Sister   . Early death Brother   . Heart disease Brother   . Hyperlipidemia Brother   . Hypertension Brother   . Early death Maternal Aunt   . Heart disease Maternal Aunt   . Early death Maternal Uncle   . Heart disease Maternal Uncle   . Heart disease Maternal Grandmother   . Early death Maternal Grandmother   . Heart disease Brother     Medications Prior to Admission  Medication Sig Dispense Refill Last Dose  . busPIRone (BUSPAR) 7.5 MG tablet Take 1 tablet (7.5 mg total) by mouth 2 (two) times daily. 60 tablet 2   . fluvastatin (LESCOL) 40 MG capsule Take 1 capsule (40 mg total) by mouth daily. Patient needs office visit prior to anymore refills. 90 capsule 1   . latanoprost (XALATAN) 0.005 % ophthalmic solution Place 1 drop into both eyes at bedtime.    Taking  . metFORMIN (GLUCOPHAGE) 500 MG tablet Take 2 tablets (1,000 mg total) by mouth daily with breakfast. (Patient taking differently: Take 500 mg by mouth 2 (two) times daily. ) 180 tablet 1   . valsartan (DIOVAN) 80 MG tablet Take 1 tablet (80 mg total) by mouth daily. 90 tablet 1   . Blood Glucose Monitoring Suppl (ONE TOUCH ULTRA SYSTEM KIT) W/DEVICE KIT 1 kit by Does not apply route once. 1 each 0 Taking  . Lancets (ONETOUCH ULTRASOFT) lancets Use as instructed 100 each 12 Taking  . ONE TOUCH ULTRA TEST test strip TEST 1 TIME DAILY. 100 each 3 Taking  . vitamin B-12 (CYANOCOBALAMIN) 1000 MCG tablet TAKE 1 TABLET BY MOUTH DAILY FOR SEVEN DAYS, THEN 1 TABLET WEEKLY THERAFTER (Patient not taking: Reported on 03/27/2018) 130 tablet 0 Not Taking at Unknown time    Current Facility-Administered Medications  Medication Dose Route Frequency Provider Last Rate Last Dose  . acetaminophen (TYLENOL) tablet 1,000 mg   1,000 mg Oral On Call to OR Michael Boston, MD      . celecoxib (CELEBREX) capsule 200 mg  200 mg Oral On Call to OR Michael Boston, MD      . Chlorhexidine Gluconate Cloth 2 % PADS 6 each  6 each Topical Once Michael Boston, MD       And  . Chlorhexidine Gluconate Cloth 2 % PADS 6 each  6 each Topical Once Ryota Treece, Remo Lipps, MD      . clindamycin (CLEOCIN) IVPB 900 mg  900 mg Intravenous On Call to OR Michael Boston, MD      . gabapentin (NEURONTIN) capsule 300  mg  300 mg Oral On Call to OR Michael Boston, MD         Allergies  Allergen Reactions  . Fenofibrate Rash  . Penicillins Rash    Childhood allergy Has patient had a PCN reaction causing immediate rash, facial/tongue/throat swelling, SOB or lightheadedness with hypotension: Yes Has patient had a PCN reaction causing severe rash involving mucus membranes or skin necrosis: No Has patient had a PCN reaction that required hospitalization: No Has patient had a PCN reaction occurring within the last 10 years: No If all of the above answers are "NO", then may proceed with Cephalosporin use.     BP (!) 155/92   Pulse 63   Temp 97.8 F (36.6 C) (Oral)   Resp 16   SpO2 99%   Labs: No results found for this or any previous visit (from the past 48 hour(s)).  Imaging / Studies: No results found.   [name], M.D., F.A.C.S. Gastrointestinal and Minimally Invasive Surgery [facility] [address] [place], [place] [address] [phone] Main / Paging  04/09/2018 8:31 AM     [name]

## 2018-04-09 NOTE — Op Note (Signed)
04/09/2018  11:57 AM  PATIENT:  Shawn Perez  51 y.o. male  Patient Care Team: Shelda Pal, DO as PCP - General (Family Medicine) Michael Boston, MD as Consulting Physician (General Surgery) McKenzie, Candee Furbish, MD as Consulting Physician (Urology)  PRE-OPERATIVE DIAGNOSIS:  Right and possible left inguinal hernias  POST-OPERATIVE DIAGNOSIS:  Bilateral femoral hernias, right inguinal hernia  PROCEDURE:  LAPAROSCOPIC RIGHT INGUINAL AND BILATERAL FEMORAL HERNIA REPAIRS INSERTION OF MESH  SURGEON:  [name], MD  ASSISTANT: Carlena Hurl, PA-C  ANESTHESIA:     Regional ilioinguinal and genitofemoral and spermatic cord nerve blocks  General  EBL:  Total I/O In: 1000 [I.V.:1000] Out: 50 [Blood:50].  See anesthesia record  Delay start of Pharmacological VTE agent (>24hrs) due to surgical blood loss or risk of bleeding:  no  DRAINS: NONE  SPECIMEN:  NONE  DISPOSITION OF SPECIMEN:  N/A  COUNTS:  YES  PLAN OF CARE: Discharge to home after PACU  PATIENT DISPOSITION:  PACU - hemodynamically stable.  INDICATION: Patient with intermittent right groin swelling with probable right inguinal hernia.  Symptoms suspicious for right femoral hernia as well.  Some discomfort in left groin suspicious for possible left inguinal hernia.  Made recommendation for laparoscopic exploration and repairs of hernias found  The anatomy & physiology of the abdominal wall and pelvic floor was discussed.  The pathophysiology of hernias in the inguinal and pelvic region was discussed.  Natural history risks such as progressive enlargement, pain, incarceration & strangulation was discussed.   Contributors to complications such as smoking, obesity, diabetes, prior surgery, etc were discussed.    I feel the risks of no intervention will lead to serious problems that outweigh the operative risks; therefore, I recommended surgery to reduce and repair the hernia.  I explained laparoscopic  techniques with possible need for an open approach.  I noted usual use of mesh to patch and/or buttress hernia repair  Risks such as bleeding, infection, abscess, need for further treatment, heart attack, death, and other risks were discussed.  I noted a good likelihood this will help address the problem.   Goals of post-operative recovery were discussed as well.  Possibility that this will not correct all symptoms was explained.  I stressed the importance of low-impact activity, aggressive pain control, avoiding constipation, & not pushing through pain to minimize risk of post-operative chronic pain or injury. Possibility of reherniation was discussed.  We will work to minimize complications.     An educational handout further explaining the pathology & treatment options was given as well.  Questions were answered.  The patient expresses understanding & wishes to proceed with surgery.  OR FINDINGS: Moderate size femoral hernia on the right side incarcerated with some preperitoneal fat.  Mild but definite indirect inguinal hernia on the right side as well.  No direct space or obturator hernias.  On the left side, smaller but definite femoral hernia.  Subtle changes suspicious for indirect left inguinal hernia but not definite.  No direct space or obturator hernias.  Obese and very thin-walled.  Significant diastases recti.  DESCRIPTION:  The patient was identified & brought into the operating room. The patient was positioned supine with arms tucked. SCDs were active during the entire case. The patient underwent general anesthesia without any difficulty.  The abdomen was prepped and draped in a sterile fashion. The patient's bladder was emptied.  A Surgical Timeout confirmed our plan.  I made a transverse incision through the inferior umbilical fold.  I made a small transverse nick through the anterior rectus fascia contralateral to the inguinal hernia side and placed a 0-vicryl stitch through the  fascia.  I placed a Hasson trocar into the preperitoneal plane.  Entry was clean.  We induced carbon dioxide insufflation. Camera inspection revealed no injury.  I used a 75mm angled scope to bluntly free the peritoneum off the infraumbilical anterior abdominal wall.  I created enough of a preperitoneal pocket to place 53mm ports into the right & left mid-abdomen into this preperitoneal cavity.    I focused attention on the RIGHT pelvis since that was the dominant hernia side.   I used blunt & focused sharp dissection to free the peritoneum off the flank and down to the pubic rim.  I freed the anteriolateral bladder wall off the anteriolateral pelvic wall, sparing midline attachments.   I located a swath of peritoneum going into a hernia fascial defect at the  femoral foramen consistent with  a femoral hernia..  I gradually freed the peritoneal hernia sac off safely and reduced it into the preperitoneal space.  I freed the peritoneum off the spermatic vessels & vas deferens and reduced a hernia out of the internal ring consistent with a indirect inguinal hernia as well.  No direct space hernia..  I freed peritoneum off the retroperitoneum along the psoas muscle.   Spermatic cord lipoma was dissected away & removed.  I checked & assured hemostasis.  The hernia sacs of peritoneum and preperitoneal fat were somewhat inflamed.  End up having to close the peritoneal defect offer a high ligation of the sac as well using laparoscopic intracorporeal suturing with 2-0 Vicryl suture.    I turned attention on the opposite  LEFT pelvis.  I did dissection in a similar, mirror-image fashion. The patient had a femoral hernia.   Some laxity in the internal ring but not definite for an indirect inguinal hernia.  No direct space or obturator hernia.   Spermatic cord lipoma was dissected away & removed.    I checked & assured hemostasis.     Given his obesity and numerous hernias, I chose 15x15 cm sheets of medium weight  polypropylene mesh (Bard), one for each side.  I cut a single sigmoid-shaped slit ~6cm from a corner of each mesh.  I placed the meshes into the preperitoneal space & laid them as overlapping diamonds such that at the inferior points, a 6x6 cm corner flap rested in the true anterolateral pelvis, covering the obturator & femoral foramina.   I allowed the bladder to return to the pubis, this helping tuck the corners of the mesh in the anteriolateral pelvis.  The medial corners overlapped each other across midline cephalad to the pubic rim.   Given the numerous hernias of moderate size, I placed a third 15x15cm mesh in the center as a vertical diamond.  The lateral wings of the mesh overlap across the direct spaces and internal rings where the dominant hernias were.  This provided good coverage and reinforcement of the hernia repairs.  Because of the central mesh placement with good overlap, I did not place any tacks.   I held the hernia sacs cephalad & evacuated carbon dioxide.  I closed the fascia with absorbable suture.  I closed the skin using 4-0 monocryl stitch.  Sterile dressings were applied.   The patient was extubated & arrived in the PACU in stable condition..  I had discussed postoperative care with the patient in the holding area.  Instructions are written in the chart.  I discussed operative findings, updated the patient's status, discussed probable steps to recovery, and gave postoperative recommendations to the patient's spouse.  Recommendations were made.  Questions were answered.  She expressed understanding & appreciation.   [name], M.D., F.A.C.S. Gastrointestinal and Minimally Invasive Surgery [facility] [address], [place], [place] [address] [phone] Main / Paging  04/09/2018 11:57 AM

## 2018-04-09 NOTE — Discharge Instructions (Signed)
HERNIA REPAIR: POST OP INSTRUCTIONS ° °###################################################################### ° °EAT °Gradually transition to a high fiber diet with a fiber supplement over the next few weeks after discharge.  Start with a pureed / full liquid diet (see below) ° °WALK °Walk an hour a day.  Control your pain to do that.   ° °CONTROL PAIN °Control pain so that you can walk, sleep, tolerate sneezing/coughing, and go up/down stairs. ° °HAVE A BOWEL MOVEMENT DAILY °Keep your bowels regular to avoid problems.  OK to try a laxative to override constipation.  OK to use an antidairrheal to slow down diarrhea.  Call if not better after 2 tries ° °CALL IF YOU HAVE PROBLEMS/CONCERNS °Call if you are still struggling despite following these instructions. °Call if you have concerns not answered by these instructions ° °###################################################################### ° ° ° °1. DIET: Follow a light bland diet the first 24 hours after arrival home, such as soup, liquids, crackers, etc.  Be sure to include lots of fluids daily.  Advance to a low fat / high fiber diet over the next few days after surgery.  Avoid fast food or heavy meals the first week as your are more likely to get nauseated.   ° °2. Take your usually prescribed home medications unless otherwise directed. ° °3. PAIN CONTROL: °a. Pain is best controlled by a usual combination of three different methods TOGETHER: °i. Ice/Heat °ii. Over the counter pain medication °iii. Prescription pain medication °b. Most patients will experience some swelling and bruising around the hernia(s) such as the bellybutton, groins, or old incisions.  Ice packs or heating pads (30-60 minutes up to 6 times a day) will help. Use ice for the first few days to help decrease swelling and bruising, then switch to heat to help relax tight/sore spots and speed recovery.  Some people prefer to use ice alone, heat alone, alternating between ice & heat.  Experiment  to what works for you.  Swelling and bruising can take several weeks to resolve.   °c. It is helpful to take an over-the-counter pain medication regularly for the first few weeks.  Choose one of the following that works best for you: °i. Naproxen (Aleve, etc)  Two 220mg tabs twice a day °ii. Ibuprofen (Advil, etc) Three 200mg tabs four times a day (every meal & bedtime) °iii. Acetaminophen (Tylenol, etc) 325-650mg four times a day (every meal & bedtime) °d. A  prescription for pain medication should be given to you upon discharge.  Take your pain medication as prescribed.  °i. If you are having problems/concerns with the prescription medicine (does not control pain, nausea, vomiting, rash, itching, etc), please call us [phone] to see if we need to switch you to a different pain medicine that will work better for you and/or control your side effect better. °ii. If you need a refill on your pain medication, please contact your pharmacy.  They will contact our office to request authorization. Prescriptions will not be filled after 5 pm or on week-ends. ° °4. Avoid getting constipated.  Between the surgery and the pain medications, it is common to experience some constipation.  Increasing fluid intake and taking a fiber supplement (such as Metamucil, Citrucel, FiberCon, MiraLax, etc) 1-2 times a day regularly will usually help prevent this problem from occurring.  A mild laxative (prune juice, Milk of Magnesia, MiraLax, etc) should be taken according to package directions if there are no bowel movements after 48 hours.   ° °5. Wash / shower every   day.  You may shower over the dressings as they are waterproof.   ° °6. Remove your waterproof bandages, skin tapes, and other bandages 5 days after surgery. You may replace a dressing/Band-Aid to cover the incision for comfort if you wish. You may leave the incisions open to air.  You may replace a dressing/Band-Aid to cover an incision for comfort if you wish.   Continue to shower over incision(s) after the dressing is off. ° °7. ACTIVITIES as tolerated:   °a. You may resume regular (light) daily activities beginning the next day--such as daily self-care, walking, climbing stairs--gradually increasing activities as tolerated.  Control your pain so that you can walk an hour a day.  If you can walk 30 minutes without difficulty, it is safe to try more intense activity such as jogging, treadmill, bicycling, low-impact aerobics, swimming, etc. °b. Save the most intensive and strenuous activity for last such as sit-ups, heavy lifting, contact sports, etc  Refrain from any heavy lifting or straining until you are off narcotics for pain control.   °c. DO NOT PUSH THROUGH PAIN.  Let pain be your guide: If it hurts to do something, don't do it.  Pain is your body warning you to avoid that activity for another week until the pain goes down. °d. You may drive when you are no longer taking prescription pain medication, you can comfortably wear a seatbelt, and you can safely maneuver your car and apply brakes. °e. You may have sexual intercourse when it is comfortable.  ° °8. FOLLOW UP in our office °a. Please call [facility] at [phone] to set up an appointment to see your surgeon in the office for a follow-up appointment approximately 2-3 weeks after your surgery. °b. Make sure that you call for this appointment the day you arrive home to insure a convenient appointment time. ° °9.  If you have disability of FMLA / Family leave forms, please bring the forms to the office for processing.  (do not give to your surgeon). ° °WHEN TO CALL US [phone]: °1. Poor pain control °2. Reactions / problems with new medications (rash/itching, nausea, etc)  °3. Fever over 101.5 F (38.5 C) °4. Inability to urinate °5. Nausea and/or vomiting °6. Worsening swelling or bruising °7. Continued bleeding from incision. °8. Increased pain, redness, or drainage from the incision ° ° The clinic staff is  available to answer your questions during regular business hours (8:30am-5pm).  Please don’t hesitate to call and ask to speak to one of our nurses for clinical concerns.  ° If you have a medical emergency, go to the nearest emergency room or call 911. ° A surgeon from [facility] is always on call at the hospitals in [place] ° °[facility] °[address], [address], [place], [place]  [address] ? ° [address] [address], [place], [place]   [address] °MAIN: [phone] ? TOLL FREE: [phone] ? FAX: [phone] °[email or web address] ° °

## 2018-04-09 NOTE — Anesthesia Procedure Notes (Signed)
Procedure Name: Intubation Date/Time: 04/09/2018 10:17 AM Performed by: Lissa Morales, CRNA Pre-anesthesia Checklist: Patient identified, Emergency Drugs available, Suction available and Patient being monitored Patient Re-evaluated:Patient Re-evaluated prior to induction Oxygen Delivery Method: Circle system utilized Preoxygenation: Pre-oxygenation with 100% oxygen Induction Type: IV induction Ventilation: Mask ventilation without difficulty Laryngoscope Size: Mac and 4 Grade View: Grade II Tube type: Oral Number of attempts: 1 Airway Equipment and Method: Stylet and Oral airway Placement Confirmation: ETT inserted through vocal cords under direct vision,  positive ETCO2 and breath sounds checked- equal and bilateral Secured at: 21 cm Tube secured with: Tape Dental Injury: Teeth and Oropharynx as per pre-operative assessment  Difficulty Due To: Difficult Airway- due to reduced neck mobility and Difficult Airway- due to limited oral opening

## 2018-04-10 ENCOUNTER — Encounter (HOSPITAL_COMMUNITY): Payer: Self-pay | Admitting: Surgery

## 2018-04-14 ENCOUNTER — Other Ambulatory Visit: Payer: Self-pay | Admitting: Family Medicine

## 2018-04-14 DIAGNOSIS — E119 Type 2 diabetes mellitus without complications: Secondary | ICD-10-CM

## 2018-04-15 MED ORDER — GLUCOSE BLOOD VI STRP: ORAL_STRIP | 1 refills | Status: DC

## 2018-04-15 MED FILL — ONE TOUCH ULTRA TEST STRIPS: 25 days supply | Qty: 25 | Fill #0

## 2018-04-15 NOTE — Telephone Encounter (Signed)
Rx sent, notified pt's spouse.

## 2018-04-15 NOTE — Addendum Note (Signed)
Addended by: Kelle Darting A on: 04/15/2018 03:08 PM   Modules accepted: Orders

## 2018-04-15 NOTE — Telephone Encounter (Signed)
Pts wife calling to request the refill of her husbands glucose test strips. Informed her the pharmacy sent the request to the wrong office. She is also upset that this wasn't refilled during his last office visit.  [place], [place] - [place] [phone] (Phone) [phone] (Fax)

## 2018-04-18 ENCOUNTER — Other Ambulatory Visit: Payer: Self-pay | Admitting: Family Medicine

## 2018-04-18 DIAGNOSIS — E119 Type 2 diabetes mellitus without complications: Secondary | ICD-10-CM

## 2018-04-18 MED FILL — metFORMIN HCL 500 MG TABS: 500 | 30 days supply | Qty: 60 | Fill #0

## 2018-04-23 ENCOUNTER — Telehealth: Payer: Self-pay

## 2018-04-23 DIAGNOSIS — E119 Type 2 diabetes mellitus without complications: Secondary | ICD-10-CM

## 2018-04-23 MED ORDER — METFORMIN HCL 1000 MG PO TABS: 1000.0000 mg | ORAL_TABLET | Freq: Two times a day (BID) | ORAL | 3 refills | Status: DC

## 2018-04-23 NOTE — Telephone Encounter (Signed)
Ideally I would want him on 1000 mg bid, but if he cannot do this due to side effects, we can investigate other options. TY.

## 2018-04-23 NOTE — Telephone Encounter (Signed)
Received fax from [facility] requesting medication clarification for metformin. Pt. States he takes 2 tabs in AM, 1 tab in PM, which is different from what order states. Routed to Dr. Nani Ravens to reconcile.

## 2018-04-23 NOTE — Addendum Note (Signed)
Addended by: Sharon Seller B on: 04/23/2018 10:41 AM   Modules accepted: Orders

## 2018-04-23 NOTE — Telephone Encounter (Signed)
Changed and sent to the pharmacy

## 2018-04-23 NOTE — Telephone Encounter (Signed)
OK. Ty.  

## 2018-04-23 NOTE — Telephone Encounter (Signed)
The pharmacist would prefer that the prescription be changed to metformin 1000 bid (currently is metformin 500 2 twice daily). If ok will change and send in.

## 2018-05-05 MED FILL — FLUVASTATIN NA 40 MG CAP: 40 | 30 days supply | Qty: 30 | Fill #1

## 2018-05-05 MED FILL — metFORMIN HCL 1000 MG TABS: 1000 | 30 days supply | Qty: 60 | Fill #0

## 2018-05-05 MED FILL — ONE TOUCH ULTRA TEST STRIPS: 25 days supply | Qty: 25 | Fill #1

## 2018-05-05 MED FILL — VALSARTAN 80 MG TABLET: 80 | 30 days supply | Qty: 30 | Fill #1

## 2018-06-03 MED FILL — VALSARTAN 80 MG TABLET: 80 | 30 days supply | Qty: 30 | Fill #2

## 2018-06-03 MED FILL — metFORMIN HCL 1000 MG TABS: 1000 | 30 days supply | Qty: 60 | Fill #1

## 2018-06-03 MED FILL — ONE TOUCH ULTRA TEST STRIPS: 25 days supply | Qty: 25 | Fill #2

## 2018-06-03 MED FILL — FLUVASTATIN NA 40 MG CAP: 40 | 30 days supply | Qty: 30 | Fill #2

## 2018-06-04 DIAGNOSIS — Z83511 Family history of glaucoma: Secondary | ICD-10-CM | POA: Diagnosis not present

## 2018-06-04 DIAGNOSIS — H401131 Primary open-angle glaucoma, bilateral, mild stage: Secondary | ICD-10-CM | POA: Diagnosis not present

## 2018-06-04 MED FILL — LUMIGAN 0.01% EYE DROPS: 0.01 | 25 days supply | Qty: 3 | Fill #0

## 2018-07-02 MED FILL — metFORMIN HCL 1000 MG TABS: 1000 | 30 days supply | Qty: 60 | Fill #2

## 2018-07-02 MED FILL — ONE TOUCH ULTRA TEST STRIPS: 25 days supply | Qty: 25 | Fill #3

## 2018-07-02 MED FILL — FLUVASTATIN NA 40 MG CAP: 40 | 30 days supply | Qty: 30 | Fill #3

## 2018-07-02 MED FILL — VALSARTAN 80 MG TABLET: 80 | 30 days supply | Qty: 30 | Fill #3

## 2018-07-21 ENCOUNTER — Ambulatory Visit (HOSPITAL_BASED_OUTPATIENT_CLINIC_OR_DEPARTMENT_OTHER)
Admission: RE | Admit: 2018-07-21 | Discharge: 2018-07-21 | Disposition: A | Discharge status: Home / Self Care | Payer: 59 | Source: Ambulatory Visit | Attending: Medical | Admitting: Medical

## 2018-07-21 ENCOUNTER — Encounter: Payer: Self-pay | Admitting: Medical

## 2018-07-21 ENCOUNTER — Ambulatory Visit: Payer: 59 | Admitting: Medical

## 2018-07-21 VITALS — BP 151/82 | HR 98 | Temp 98.0°F | Resp 16 | Ht 68.0 in | Wt 190.2 lb

## 2018-07-21 DIAGNOSIS — M25562 Pain in left knee: Secondary | ICD-10-CM

## 2018-07-21 DIAGNOSIS — M7652 Patellar tendinitis, left knee: Secondary | ICD-10-CM | POA: Diagnosis not present

## 2018-07-21 DIAGNOSIS — S8992XA Unspecified injury of left lower leg, initial encounter: Secondary | ICD-10-CM | POA: Diagnosis not present

## 2018-07-21 DIAGNOSIS — E119 Type 2 diabetes mellitus without complications: Secondary | ICD-10-CM | POA: Diagnosis not present

## 2018-07-21 DIAGNOSIS — M7989 Other specified soft tissue disorders: Secondary | ICD-10-CM | POA: Diagnosis not present

## 2018-07-21 MED ORDER — KETOROLAC TROMETHAMINE 60 MG/2ML IM SOLN
60.0000 mg | Freq: Once | INTRAMUSCULAR | Status: AC
Start: 2018-07-21 — End: 2018-07-21
  Administered 2018-07-21: 60 mg via INTRAMUSCULAR

## 2018-07-21 NOTE — Patient Instructions (Addendum)
For your left knee pain, will get x-ray, CBC and uric acid level.  Will give Toradol 60 mg IM for pain and inflammation.  Will follow lab results and determine what other medications to give.  He has some features of possible patellar tendinitis on exam.  Recommend ice the area twice daily 10 to 15 minutes.  If uric acid is normal will likely call him NSAID.  If uric acid is elevated then will prescribe colchicine.  If effusion is seen on x-ray then might consider referral to sports medicine.  For diabetes, we will go ahead and get CMP and A1c.  Will let your PCP know the results of this test.  Follow-up in 7 to 10 days or as needed.  Below is info on tendinitis. One of many differential dx.   Patellar Tendinitis Patellar tendinitis, also called jumper's knee, is inflammation of the patellar tendon. Tendons are cord-like tissues that connect muscles to bones. The patellar tendon connects the bottom of the kneecap (patella) to the top of the shin bone (tibia). Patellar tendinitis causes pain in the front of the knee. The condition happens in the following stages:  Stage 1. In this stage, there is pain only after activity.  Stage 2: In this stage, you have pain during and after activity.  Stage 3: In this stage, you have pain during and after activity that limits your ability to do the activity.  Stage 4: In this stage, the tendon tears and severely limits your activity.  What are the causes? This condition is caused by repeated (repetitive) stress on the tendon. This stress may cause the tendon to stretch, swell, thicken, or tear. What increases the risk? The following factors make you more likely to develop this condition:  Participating in sports that involve running, kicking and jumping, especially on hard surfaces. These include: ? Basketball. ? Volleyball. ? Soccer. ? Track and field.  Having tight thigh muscles.  Having received steroid injections in the tendon.  Having had  knee surgery.  Being 53-45 years old.  Having rheumatoid arthritis or diabetes.  Training too hard.  What are the signs or symptoms? The main symptom of this condition is pain in the front of the knee. The pain usually starts slowly then it gradually gets worse. It may become painful to straighten your leg. How is this diagnosed? This condition may be diagnosed based on:  Your symptoms.  A medical history.  A physical exam. During the physical exam, your health care provider may check for tenderness in your patella, tightness in your thigh muscles, and pain when you straighten your knee.  Imaging tests, including: ? X-rays. These will show the position and condition of your patella. ? MRI. This will show any tears in your tendon. ? Ultrasound. This will show any swelling in your tendon and the thickness of your tendon.  How is this treated? Treatment for this condition depends on the stage of the condition. It may involve:  Avoiding activities that cause pain.  Icing your knee.  Taking an NSAID to reduce pain and swelling.  Doing stretching and strengthening exercises (physical therapy) when pain and swelling improve.  Having sound wave stimulation to promote healing.  Wearing a knee brace. This may be needed if your condition does not improve with treatment.  Using crutches or a walker. This may be needed if your condition does not improve with treatment.  Surgery. This may be done if you have stage 4 tendinitis.  Follow these instructions  at home: If You Have a Knee Brace:  Wear it as told by your health care provider. Remove it only as told by your health care provider.  Loosen the brace if your toes become numb and tingle, or if they turn cold and blue.  Do not let your brace get wet if it is not waterproof.  Keep the brace clean.  Ask your health care provider when it is safe for you to drive. Managing pain, stiffness, and swelling  Take over-the-counter  and prescription medicines only as told by your health care provider.  If directed, apply ice to the injured area. ? Put ice in a plastic bag. ? Place a towel between your skin and the bag. ? Leave the ice on for 20 minutes, 2-3 times a day.  Move your toes often to avoid stiffness and to lessen swelling.  Raise (elevate) your knee above the level of your heart while you are sitting or lying down. Activity  Return to your normal activities as told by your health care provider. Ask your health care provider what activities are safe for you.  Do exercises as told by your health care provider. General instructions  Do not use the injured limb to support your body weight until your health care provider says that you can. Use your crutches or walker as told by your health care provider.  Keep all follow-up visits as told by your health care provider. This is important. How is this prevented?  Warm up and stretch before being active.  Cool down and stretch after being active.  Give your body time to rest between periods of activity.  Make sure to use equipment that fits you.  Be safe and responsible while being active to avoid falls.  Do at least 150 minutes of moderate-intensity exercise each week, such as brisk walking or water aerobics.  Maintain physical fitness, including: ? Strength. ? Flexibility. ? Cardiovascular fitness. ? Endurance. Contact a health care provider if:  Your symptoms have not improve in 6 weeks.  Your symptoms get worse. This information is not intended to replace advice given to you by your health care provider. Make sure you discuss any questions you have with your health care provider. Document Released: 08/13/2005 Document Revised: 04/17/2016 Document Reviewed: 05/17/2015 Elsevier Interactive Patient Education  [employer].

## 2018-07-21 NOTE — Progress Notes (Signed)
Subjective:    Patient ID: Shawn Perez, male    DOB: 07-Jul-1966, 52 y.o.   MRN: [id number]  HPI  Pt in today with some left knee pain. He states he had a week of knee pain. States last 2 days pain is worse. No fall or trauma. Pt states in past he notes more pain when he is on his knees. He is a Education administrator. He has been dong some yard work recently. No fall or trauma.   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  No fever, no chills or sweats.   In the past would only have some faint knee achiness in past for a day or two. Then would resolve.  Pt had some left over tramadol from hernia repair and it did not help him.  Pt is diabetic. He is on metformin.    Review of Systems  Constitutional: Negative for chills, fatigue and fever.  Respiratory: Negative for chest tightness, shortness of breath and wheezing.   Cardiovascular: Negative for chest pain and palpitations.  Gastrointestinal: Negative for abdominal pain.  Musculoskeletal:       Left knee knee-mild swelling and warmth.  Skin: Negative for rash.  Hematological: Negative for adenopathy. Does not bruise/bleed easily.  Psychiatric/Behavioral: Negative for behavioral problems and confusion.     Past Medical History:  Diagnosis Date  . Anxiety   . Diabetes mellitus without complication ([place])    type 2  . Family history of adverse reaction to anesthesia    [name] has trouble waking up both times under anesthesia diagnosed with sleep apnea  . Heart murmur    since birth  . Hyperlipidemia 2011  . Hypertension   . Sleep apnea    wears cpap  . Temporomandibular jaw dysfunction      Social History   Socioeconomic History  . Marital status: Married    Spouse name: Jeani Hawking  . Number of children: Not on file  . Years of education: Not on file  . Highest education level: Not on file  Occupational History    Employer: [employer]  Social Needs  . Financial resource strain: Patient refused  . Food insecurity:   Worry: Patient refused    Inability: Patient refused  . Transportation needs:    Medical: No    Non-medical: No  Tobacco Use  . Smoking status: Former Research scientist (life sciences)  . Smokeless tobacco: Never Used  . Tobacco comment: quit in 2000 smike 1 ppd  Substance and Sexual Activity  . Alcohol use: Yes    Alcohol/week: 2.0 - 3.0 standard drinks    Types: 2 - 3 Glasses of wine per week    Comment: daily  . Drug use: No    Comment: cbd oil  . Sexual activity: Yes  Lifestyle  . Physical activity:    Days per week: 0 days    Minutes per session: 0 min  . Stress: Patient refused  Relationships  . Social connections:    Talks on phone: Not on file    Gets together: Not on file    Attends religious service: Not on file    Active member of club or organization: Not on file    Attends meetings of clubs or organizations: Not on file    Relationship status: Married  . Intimate partner violence:    Fear of current or ex partner: Patient refused    Emotionally abused: Patient refused    Physically abused: Patient refused    Forced sexual activity: Patient  refused  Other Topics Concern  . Not on file  Social History Narrative  . Not on file    Past Surgical History:  Procedure Laterality Date  . COLONOSCOPY    . INGUINAL HERNIA REPAIR N/A 04/09/2018   Procedure: LAPAROSCOPIC RIGHT INGUINAL AND BILATERAL FEMORAL HERNIA REPAIR;  Surgeon: Michael Boston, MD;  Location: [facility];  Service: General;  Laterality: N/A;  . INSERTION OF MESH N/A 04/09/2018   Procedure: INSERTION OF MESH;  Surgeon: Michael Boston, MD;  Location: [facility];  Service: General;  Laterality: N/A;  . NO PAST SURGERIES  07/09/2011   Denies surgical history  . WISDOM TOOTH EXTRACTION      Family History  Problem Relation Age of Onset  . Hypertension Mother   . Diabetes Mother   . Early death Mother   . Heart disease Mother   . Heart disease Sister   . Early death Brother   . Heart disease Brother   . Hyperlipidemia Brother   .  Hypertension Brother   . Early death Maternal Aunt   . Heart disease Maternal Aunt   . Early death Maternal Uncle   . Heart disease Maternal Uncle   . Heart disease Maternal Grandmother   . Early death Maternal Grandmother   . Heart disease Brother     Allergies  Allergen Reactions  . Fenofibrate Rash  . Penicillins Rash    Childhood allergy Has patient had a PCN reaction causing immediate rash, facial/tongue/throat swelling, SOB or lightheadedness with hypotension: Yes Has patient had a PCN reaction causing severe rash involving mucus membranes or skin necrosis: No Has patient had a PCN reaction that required hospitalization: No Has patient had a PCN reaction occurring within the last 10 years: No If all of the above answers are "NO", then may proceed with Cephalosporin use.     Current Outpatient Medications on File Prior to Visit  Medication Sig Dispense Refill  . Blood Glucose Monitoring Suppl (ONE TOUCH ULTRA SYSTEM KIT) W/DEVICE KIT 1 kit by Does not apply route once. 1 each 0  . fluvastatin (LESCOL) 40 MG capsule Take 1 capsule (40 mg total) by mouth daily. Patient needs office visit prior to anymore refills. 90 capsule 1  . glucose blood (ONE TOUCH ULTRA TEST) test strip TEST 1 TIME DAILY. 100 each 1  . Lancets (ONETOUCH ULTRASOFT) lancets Use as instructed 100 each 12  . latanoprost (XALATAN) 0.005 % ophthalmic solution Place 1 drop into both eyes at bedtime.     . metFORMIN (GLUCOPHAGE) 1000 MG tablet Take 1 tablet (1,000 mg total) by mouth 2 (two) times daily with a meal. 60 tablet 3  . traMADol (ULTRAM) 50 MG tablet Take 1-2 tablets (50-100 mg total) by mouth every 6 (six) hours as needed for moderate pain or severe pain. 30 tablet 0  . valsartan (DIOVAN) 80 MG tablet Take 1 tablet (80 mg total) by mouth daily. 90 tablet 1  . vitamin B-12 (CYANOCOBALAMIN) 1000 MCG tablet TAKE 1 TABLET BY MOUTH DAILY FOR SEVEN DAYS, THEN 1 TABLET WEEKLY THERAFTER 130 tablet 0   No  current facility-administered medications on file prior to visit.     BP (!) 151/82   Pulse 98   Temp 98 F (36.7 C) (Oral)   Resp 16   Ht '5\' 8"'  (1.727 m)   Wt 190 lb 3.2 oz (86.3 kg)   SpO2 94%   BMI 28.92 kg/m       Objective:   Physical Exam  General- No acute distress. Pleasant patient. Neck- Full range of motion, no jvd Lungs- Clear, even and unlabored. Heart- regular rate and rhythm. Neurologic- CNII- XII grossly intact.  Left knee- mild swelling compared to the right side.  Mild to moderate warmth over anterior aspect.  The patellar tendon is directly tender to palpation and appears to have slight callus formation.  Left calf-no swelling.  Negative Homans sign.  Right knee-no swelling and normal range of motion.      Assessment & Plan:  For your left knee pain, will get x-ray, CBC and uric acid level.  Will give Toradol 60 mg IM for pain and inflammation.  Will follow lab results and determine what other medications to give.  He has some features of possible patellar tendinitis on exam.  Recommend ice the area twice daily 10 to 15 minutes.  If uric acid is normal will likely call him NSAID.  If uric acid is elevated then will prescribe colchicine.  If effusion is seen on x-ray then might consider referral to sports medicine.  For diabetes, we will go ahead and get CMP and A1c.  Will let your PCP know the results of this test.  Follow-up in 7 to 10 days or as needed.

## 2018-07-22 ENCOUNTER — Telehealth: Payer: Self-pay | Admitting: Medical

## 2018-07-22 ENCOUNTER — Telehealth: Payer: Self-pay

## 2018-07-22 ENCOUNTER — Other Ambulatory Visit: Payer: Self-pay | Admitting: Family Medicine

## 2018-07-22 ENCOUNTER — Encounter: Payer: Self-pay | Admitting: Medical

## 2018-07-22 DIAGNOSIS — M25562 Pain in left knee: Secondary | ICD-10-CM

## 2018-07-22 LAB — CBC WITH DIFFERENTIAL/PLATELET
BASOS ABS: 0.1 10*3/uL (ref 0.0–0.1)
Basophils Relative: 0.8 % (ref 0.0–3.0)
EOS ABS: 0.1 10*3/uL (ref 0.0–0.7)
Eosinophils Relative: 0.7 % (ref 0.0–5.0)
HCT: 44.2 % (ref 39.0–52.0)
Hemoglobin: 15 g/dL (ref 13.0–17.0)
LYMPHS ABS: 1.2 10*3/uL (ref 0.7–4.0)
Lymphocytes Relative: 12.1 % (ref 12.0–46.0)
MCHC: 34 g/dL (ref 30.0–36.0)
MCV: 94.2 fl (ref 78.0–100.0)
MONO ABS: 0.7 10*3/uL (ref 0.1–1.0)
Monocytes Relative: 7.6 % (ref 3.0–12.0)
NEUTROS PCT: 78.8 % — AB (ref 43.0–77.0)
Neutro Abs: 7.5 10*3/uL (ref 1.4–7.7)
Platelets: 263 10*3/uL (ref 150.0–400.0)
RBC: 4.69 Mil/uL (ref 4.22–5.81)
RDW: 12.9 % (ref 11.5–15.5)
WBC: 9.5 10*3/uL (ref 4.0–10.5)

## 2018-07-22 LAB — COMPREHENSIVE METABOLIC PANEL
ALK PHOS: 76 U/L (ref 39–117)
ALT: 32 U/L (ref 0–53)
AST: 16 U/L (ref 0–37)
Albumin: 4.6 g/dL (ref 3.5–5.2)
BUN: 19 mg/dL (ref 6–23)
CO2: 30 mEq/L (ref 19–32)
Calcium: 10 mg/dL (ref 8.4–10.5)
Chloride: 97 mEq/L (ref 96–112)
Creatinine, Ser: 1.08 mg/dL (ref 0.40–1.50)
GFR: 75.59 mL/min (ref >60.00)
GLUCOSE: 278 mg/dL — AB (ref 70–99)
POTASSIUM: 4.6 meq/L (ref 3.5–5.1)
SODIUM: 137 meq/L (ref 135–145)
TOTAL PROTEIN: 6.8 g/dL (ref 6.0–8.3)
Total Bilirubin: 0.5 mg/dL (ref 0.2–1.2)

## 2018-07-22 LAB — URIC ACID: URIC ACID, SERUM: 6.6 mg/dL (ref 4.0–7.8)

## 2018-07-22 LAB — HEMOGLOBIN A1C: Hgb A1c MFr Bld: 9.1 % — ABNORMAL HIGH (ref 4.6–6.5)

## 2018-07-22 MED ORDER — DAPAGLIFLOZIN PROPANEDIOL 10 MG PO TABS: 10.0000 mg | ORAL_TABLET | Freq: Every day | ORAL | 2 refills | Status: DC

## 2018-07-22 MED ORDER — DICLOFENAC SODIUM 75 MG PO TBEC: 75.0000 mg | DELAYED_RELEASE_TABLET | Freq: Two times a day (BID) | ORAL | 0 refills | Status: DC

## 2018-07-22 MED FILL — DICLOFENAC SOD EC 75 MG TAB: 75 | 15 days supply | Qty: 30 | Fill #0

## 2018-07-22 NOTE — Telephone Encounter (Signed)
PA initiated via Covermymeds; KEY: [id number]. Awaiting determination.

## 2018-07-22 NOTE — Progress Notes (Signed)
Calling in a new medication for DM to take in addn to Metformin. Keep diet clean, ck sugars and let us know if med is too expensive, we have payment cards here. TY.

## 2018-07-22 NOTE — Telephone Encounter (Signed)
Rx diclofenac sent to pt pharmacy. 

## 2018-07-22 NOTE — Progress Notes (Signed)
Called the patient informed of results/instructions.

## 2018-07-23 MED ORDER — EMPAGLIFLOZIN 25 MG PO TABS: 25.0000 mg | ORAL_TABLET | Freq: Every day | ORAL | 5 refills | Status: DC

## 2018-07-23 MED FILL — JARDIANCE 25 MG TABLET: 25 | 30 days supply | Qty: 30 | Fill #0

## 2018-07-23 NOTE — Telephone Encounter (Signed)
PA denied. Preferred: Shawn Perez

## 2018-07-23 NOTE — Telephone Encounter (Signed)
Jardiance sent in. TY.

## 2018-07-31 ENCOUNTER — Ambulatory Visit: Payer: 59 | Admitting: Family Medicine

## 2018-07-31 ENCOUNTER — Encounter: Payer: Self-pay | Admitting: Family Medicine

## 2018-07-31 VITALS — BP 117/76 | HR 80 | Ht 68.0 in | Wt 183.0 lb

## 2018-07-31 DIAGNOSIS — M25562 Pain in left knee: Secondary | ICD-10-CM

## 2018-07-31 MED ORDER — DICLOFENAC SODIUM 1 % TD GEL: 2.0000 g | Freq: Four times a day (QID) | TRANSDERMAL | 2 refills | Status: DC | PRN

## 2018-07-31 MED FILL — DICLOFENAC SODIUM 1% GEL: 1 | 25 days supply | Qty: 200 | Fill #0

## 2018-07-31 NOTE — Patient Instructions (Signed)
You have patellar tendinitis. Ice area 15 minutes at a time 3-4 times a day as needed. Voltaren gel up to 4 times a day for pain and inflammation. Straight leg raises, knee extensions, decline squats 3 sets of 10 once a day. Patellar tendon strap when up and walking around. Consider physical therapy. Consider nitro patches if not improving.   Follow up with me in 6 weeks.

## 2018-07-31 NOTE — Progress Notes (Signed)
PCP: Shelda Pal, DO Consultation requested by Mackie Pai PA-C  Subjective:   HPI: Patient is a 51 y.o. male here for left knee pain.  Patient reports he's had about 2 weeks of anterior left knee pain. No acute injury or trauma. 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 No increase in activity level - doing some typical yard work. Pain is 3/10 level and anterior below patella, can be sharp. Worse with stairs. No skin changes, numbness.  Past Medical History:  Diagnosis Date  . Anxiety   . Diabetes mellitus without complication ([place])    type 2  . Family history of adverse reaction to anesthesia    [name] has trouble waking up both times under anesthesia diagnosed with sleep apnea  . Heart murmur    since birth  . Hyperlipidemia 2011  . Hypertension   . Sleep apnea    wears cpap  . Temporomandibular jaw dysfunction     Current Outpatient Medications on File Prior to Visit  Medication Sig Dispense Refill  . Blood Glucose Monitoring Suppl (ONE TOUCH ULTRA SYSTEM KIT) W/DEVICE KIT 1 kit by Does not apply route once. 1 each 0  . diclofenac (VOLTAREN) 75 MG EC tablet Take 1 tablet (75 mg total) by mouth 2 (two) times daily. 30 tablet 0  . empagliflozin (JARDIANCE) 25 MG TABS tablet Take 25 mg by mouth daily. 30 tablet 5  . fluvastatin (LESCOL) 40 MG capsule Take 1 capsule (40 mg total) by mouth daily. Patient needs office visit prior to anymore refills. 90 capsule 1  . glucose blood (ONE TOUCH ULTRA TEST) test strip TEST 1 TIME DAILY. 100 each 1  . Lancets (ONETOUCH ULTRASOFT) lancets Use as instructed 100 each 12  . latanoprost (XALATAN) 0.005 % ophthalmic solution Place 1 drop into both eyes at bedtime.     . metFORMIN (GLUCOPHAGE) 1000 MG tablet Take 1 tablet (1,000 mg total) by mouth 2 (two) times daily with a meal. 60 tablet 3  . traMADol (ULTRAM) 50 MG tablet Take 1-2 tablets (50-100 mg total) by mouth every 6 (six) hours as needed for moderate  pain or severe pain. 30 tablet 0  . valsartan (DIOVAN) 80 MG tablet Take 1 tablet (80 mg total) by mouth daily. 90 tablet 1  . vitamin B-12 (CYANOCOBALAMIN) 1000 MCG tablet TAKE 1 TABLET BY MOUTH DAILY FOR SEVEN DAYS, THEN 1 TABLET WEEKLY THERAFTER 130 tablet 0   No current facility-administered medications on file prior to visit.     Past Surgical History:  Procedure Laterality Date  . COLONOSCOPY    . INGUINAL HERNIA REPAIR N/A 04/09/2018   Procedure: LAPAROSCOPIC RIGHT INGUINAL AND BILATERAL FEMORAL HERNIA REPAIR;  Surgeon: Michael Boston, MD;  Location: [facility];  Service: General;  Laterality: N/A;  . INSERTION OF MESH N/A 04/09/2018   Procedure: INSERTION OF MESH;  Surgeon: Michael Boston, MD;  Location: [facility];  Service: General;  Laterality: N/A;  . NO PAST SURGERIES  07/09/2011   Denies surgical history  . WISDOM TOOTH EXTRACTION      Allergies  Allergen Reactions  . Fenofibrate Rash  . Penicillins Rash    Childhood allergy Has patient had a PCN reaction causing immediate rash, facial/tongue/throat swelling, SOB or lightheadedness with hypotension: Yes Has patient had a PCN reaction causing severe rash involving mucus membranes or skin necrosis: No Has patient had a PCN reaction that required hospitalization: No Has patient had a PCN reaction occurring within the last  10 years: No If all of the above answers are "NO", then may proceed with Cephalosporin use.     Social History   Socioeconomic History  . Marital status: Married    Spouse name: Jeani Hawking  . Number of children: Not on file  . Years of education: Not on file  . Highest education level: Not on file  Occupational History    Employer: [employer]  Social Needs  . Financial resource strain: Patient refused  . Food insecurity:    Worry: Patient refused    Inability: Patient refused  . Transportation needs:    Medical: No    Non-medical: No  Tobacco Use  . Smoking status: Former Research scientist (life sciences)  . Smokeless  tobacco: Never Used  . Tobacco comment: quit in 2000 smike 1 ppd  Substance and Sexual Activity  . Alcohol use: Yes    Alcohol/week: 2.0 - 3.0 standard drinks    Types: 2 - 3 Glasses of wine per week    Comment: daily  . Drug use: No    Comment: cbd oil  . Sexual activity: Yes  Lifestyle  . Physical activity:    Days per week: 0 days    Minutes per session: 0 min  . Stress: Patient refused  Relationships  . Social connections:    Talks on phone: Not on file    Gets together: Not on file    Attends religious service: Not on file    Active member of club or organization: Not on file    Attends meetings of clubs or organizations: Not on file    Relationship status: Married  . Intimate partner violence:    Fear of current or ex partner: Patient refused    Emotionally abused: Patient refused    Physically abused: Patient refused    Forced sexual activity: Patient refused  Other Topics Concern  . Not on file  Social History Narrative  . Not on file    Family History  Problem Relation Age of Onset  . Hypertension Mother   . Diabetes Mother   . Early death Mother   . Heart disease Mother   . Heart disease Sister   . Early death Brother   . Heart disease Brother   . Hyperlipidemia Brother   . Hypertension Brother   . Early death Maternal Aunt   . Heart disease Maternal Aunt   . Early death Maternal Uncle   . Heart disease Maternal Uncle   . Heart disease Maternal Grandmother   . Early death Maternal Grandmother   . Heart disease Brother     BP 117/76   Pulse 80   Ht _0  (1.727 m)   Wt 183 lb (83 kg)   BMI 27.83 kg/m   Review of Systems: See HPI above.     Objective:  Physical Exam:  Gen: NAD, comfortable in exam room  Left knee: No gross deformity, ecchymoses, effusion. Minimal TTP inferior patellar tendon.  No joint line, other tenderness. FROM with 5/5 strength flexion and extension. Negative ant/post drawers. Negative valgus/varus testing. Negative  lachmans. Negative mcmurrays, apleys, patellar apprehension. NV intact distally.  Right knee: No deformity. FROM with 5/5 strength. No tenderness to palpation. NVI distally.   Assessment & Plan:  1. Left knee pain - 2/2 patellar tendinitis.  Shown home exercises to do daily.  Icing, voltaren gel, patellar tendon strap.  Consider physical therapy, nitro patches.  F/u in 6 weeks.

## 2018-08-01 NOTE — Addendum Note (Signed)
Addended by: Dene Gentry on: 08/01/2018 08:24 AM   Modules accepted: Level of Service

## 2018-08-08 MED FILL — FLUVASTATIN NA 40 MG CAP: 40 | 30 days supply | Qty: 30 | Fill #4

## 2018-08-08 MED FILL — VALSARTAN 80 MG TABS: 80 | 30 days supply | Qty: 30 | Fill #4

## 2018-08-08 MED FILL — ONE TOUCH ULTRA TEST STRIPS: 25 days supply | Qty: 25 | Fill #4

## 2018-08-25 MED FILL — JARDIANCE 25 MG TABLET: 25 | 30 days supply | Qty: 30 | Fill #1

## 2018-08-29 MED FILL — metFORMIN HCL 1000 MG TABS: 1000 | 30 days supply | Qty: 60 | Fill #3

## 2018-09-04 MED FILL — VALSARTAN 80 MG TABS: 80 | 30 days supply | Qty: 30 | Fill #5

## 2018-09-04 MED FILL — FLUVASTATIN NA 40 MG CAP: 40 | 30 days supply | Qty: 30 | Fill #5

## 2018-09-25 MED FILL — JARDIANCE 25 MG TABLET: 25 | 30 days supply | Qty: 30 | Fill #2

## 2018-10-07 MED FILL — ONE TOUCH ULTRA TEST STRIPS: 25 days supply | Qty: 25 | Fill #5

## 2018-10-14 ENCOUNTER — Other Ambulatory Visit: Payer: Self-pay | Admitting: Family Medicine

## 2018-10-14 DIAGNOSIS — I10 Essential (primary) hypertension: Secondary | ICD-10-CM

## 2018-10-14 DIAGNOSIS — E785 Hyperlipidemia, unspecified: Secondary | ICD-10-CM

## 2018-10-15 MED FILL — VALSARTAN 80 MG TABLET: 80 | 30 days supply | Qty: 30 | Fill #0

## 2018-10-15 MED FILL — FLUVASTATIN NA 40 MG CAP: 40 | 30 days supply | Qty: 30 | Fill #0

## 2018-10-15 MED FILL — metFORMIN HCL 1000 MG TABS: 1000 | 30 days supply | Qty: 60 | Fill #0

## 2018-10-23 ENCOUNTER — Ambulatory Visit: Payer: 59 | Admitting: Family Medicine

## 2018-10-23 ENCOUNTER — Encounter: Payer: Self-pay | Admitting: Family Medicine

## 2018-10-23 VITALS — BP 120/62 | HR 76 | Temp 97.5°F | Ht 68.0 in | Wt 173.5 lb

## 2018-10-23 DIAGNOSIS — Z114 Encounter for screening for human immunodeficiency virus [HIV]: Secondary | ICD-10-CM | POA: Diagnosis not present

## 2018-10-23 DIAGNOSIS — Z1159 Encounter for screening for other viral diseases: Secondary | ICD-10-CM | POA: Diagnosis not present

## 2018-10-23 DIAGNOSIS — R634 Abnormal weight loss: Secondary | ICD-10-CM

## 2018-10-23 DIAGNOSIS — E119 Type 2 diabetes mellitus without complications: Secondary | ICD-10-CM

## 2018-10-23 NOTE — Progress Notes (Signed)
Subjective:  CC: DM, wt loss  Shawn Perez is a 53 y.o. male here for follow-up of diabetes.   [name] does not routinely check his sugars at home. Patient does not require insulin.   Medications include: Jardiance 25 mg/d, Metformin 1000 mg bid Diet has been healthy overall, no changes. Exercise: None, building a small house  Has lost around 15 lbs over past several months. He has not changed his diet. Has been building a house on weekends and believes he has been more active. Has been on Jardiance also since this started also. Denies rashes/moles, cough, bleeding in stool, pain.    Past Medical History:  Diagnosis Date  . Anxiety   . Diabetes mellitus without complication ([place])    type 2  . Family history of adverse reaction to anesthesia    Shawn Perez has trouble waking up both times under anesthesia diagnosed with sleep apnea  . Heart murmur    since birth  . Hyperlipidemia 2011  . Hypertension   . Sleep apnea    wears cpap  . Temporomandibular jaw dysfunction      Related testing: Date of retinal exam: >1 yr Pneumovax: done Flu Shot: Not done  Review of Systems: Pulmonary:  No SOB Cardiovascular:  No chest pain  Objective:  BP 120/62 (BP Location: Left Arm, Patient Position: Sitting, Cuff Size: Normal)   Pulse 76   Temp (!) 97.5 F (36.4 C) (Oral)   Ht 5\' 8"  (1.727 m)   Wt 173 lb 8 oz (78.7 kg)   SpO2 96%   BMI 26.38 kg/m  General:  Well developed, well nourished, in no apparent distress Skin:  Warm, no pallor or diaphoresis Head:  Normocephalic, atraumatic Eyes:  Pupils equal and round, sclera anicteric without injection  Lungs:  CTAB, no access msc use Cardio:  RRR, no bruits, no LE edema Abd: BS+, S, NT, ND Psych: Age appropriate judgment and insight  Assessment:   Unintentional weight loss - Plan: TSH, Comprehensive metabolic panel, CBC, CBC, Comprehensive metabolic panel, Hepatitis C Antibody, HIV antibody (with reflex), TSH, Hemoglobin A1c  Type 2  diabetes mellitus without complication, without long-term current use of insulin (HCC) - Plan: Hemoglobin A1c, CBC, Comprehensive metabolic panel, Hepatitis C Antibody, HIV antibody (with reflex), TSH, Hemoglobin A1c, Hemoglobin A1c  Screening for HIV (human immunodeficiency virus) - Plan: HIV Antibody (routine testing w rflx)  Encounter for hepatitis C screening test for low risk patient - Plan: Hepatitis C antibody  Plan:   Orders as above. Wt loss likely related to increased activity and Jardiance. Will ck other labs. He is utd with cancer screenings and has no s/s's suggestive of having issues. Counseled on diet and exercise. Will see what A1c is.  F/u in 3 mo. The patient voiced understanding and agreement to the plan.  [place], DO 10/23/18 3:14 PM

## 2018-10-23 NOTE — Patient Instructions (Addendum)
Give [place] 2-3 business days to get the results of your labs back.   Keep the diet clean and stay active.  The Jardiance has a side effect of weight loss.   Let us know if you need anything.

## 2018-10-24 LAB — CBC
HCT: 49.7 % (ref 39.0–52.0)
HEMOGLOBIN: 16.7 g/dL (ref 13.0–17.0)
MCHC: 33.6 g/dL (ref 30.0–36.0)
MCV: 95.8 fl (ref 78.0–100.0)
Platelets: 302 10*3/uL (ref 150.0–400.0)
RBC: 5.18 Mil/uL (ref 4.22–5.81)
RDW: 13 % (ref 11.5–15.5)
WBC: 6 10*3/uL (ref 4.0–10.5)

## 2018-10-24 LAB — COMPREHENSIVE METABOLIC PANEL
ALT: 52 U/L (ref 0–53)
AST: 30 U/L (ref 0–37)
Albumin: 4.7 g/dL (ref 3.5–5.2)
Alkaline Phosphatase: 66 U/L (ref 39–117)
BUN: 21 mg/dL (ref 6–23)
CO2: 30 mEq/L (ref 19–32)
Calcium: 9.9 mg/dL (ref 8.4–10.5)
Chloride: 101 mEq/L (ref 96–112)
Creatinine, Ser: 0.98 mg/dL (ref 0.40–1.50)
GFR: 79.48 mL/min (ref >60.00)
Glucose, Bld: 177 mg/dL — ABNORMAL HIGH (ref 70–99)
POTASSIUM: 4.7 meq/L (ref 3.5–5.1)
Sodium: 142 mEq/L (ref 135–145)
Total Bilirubin: 0.5 mg/dL (ref 0.2–1.2)
Total Protein: 6.9 g/dL (ref 6.0–8.3)

## 2018-10-24 LAB — HEPATITIS C ANTIBODY
Hepatitis C Ab: NONREACTIVE
SIGNAL TO CUT-OFF: 0.01 (ref <1.00)

## 2018-10-24 LAB — HEMOGLOBIN A1C: Hgb A1c MFr Bld: 7 % — ABNORMAL HIGH (ref 4.6–6.5)

## 2018-10-24 LAB — HIV ANTIBODY (ROUTINE TESTING W REFLEX): HIV 1&2 Ab, 4th Generation: NONREACTIVE

## 2018-10-24 LAB — TSH: TSH: 1.04 u[IU]/mL (ref 0.35–4.50)

## 2018-10-30 MED FILL — JARDIANCE 25 MG TABLET: 25 | 30 days supply | Qty: 30 | Fill #3

## 2018-11-17 MED FILL — JARDIANCE 25 MG TABLET: 25 | 30 days supply | Qty: 30 | Fill #4

## 2018-11-17 MED FILL — FLUVASTATIN NA 40 MG CAP: 40 | 30 days supply | Qty: 30 | Fill #1

## 2018-11-17 MED FILL — VALSARTAN 80 MG TABLET: 80 | 30 days supply | Qty: 30 | Fill #1

## 2018-12-01 DIAGNOSIS — Z9989 Dependence on other enabling machines and devices: Secondary | ICD-10-CM | POA: Diagnosis not present

## 2018-12-01 DIAGNOSIS — G4733 Obstructive sleep apnea (adult) (pediatric): Secondary | ICD-10-CM | POA: Diagnosis not present

## 2018-12-17 MED FILL — VALSARTAN 160 MG TABLET: 160 | 30 days supply | Qty: 15 | Fill #0

## 2018-12-17 MED FILL — metFORMIN HCL 1000 MG TABS: 1000 | 30 days supply | Qty: 60 | Fill #1

## 2018-12-17 MED FILL — JARDIANCE 25 MG TABLET: 25 | 30 days supply | Qty: 30 | Fill #5

## 2018-12-17 MED FILL — FLUVASTATIN NA 40 MG CAP: 40 | 30 days supply | Qty: 30 | Fill #2

## 2019-01-14 ENCOUNTER — Telehealth: Payer: Self-pay | Admitting: Family Medicine

## 2019-01-14 ENCOUNTER — Other Ambulatory Visit: Payer: Self-pay | Admitting: Family Medicine

## 2019-01-14 MED ORDER — LOSARTAN POTASSIUM 50 MG PO TABS: 50.0000 mg | ORAL_TABLET | Freq: Every day | ORAL | 3 refills | Status: DC

## 2019-01-14 MED FILL — FLUVASTATIN NA 40 MG CAP: 40 | 30 days supply | Qty: 30 | Fill #3

## 2019-01-14 MED FILL — LOSARTAN POTASSIUM 50 MG TA: 50 | 30 days supply | Qty: 30 | Fill #0

## 2019-01-14 MED FILL — JARDIANCE 25 MG TABLET: 25 | 30 days supply | Qty: 30 | Fill #0

## 2019-01-14 NOTE — Telephone Encounter (Signed)
Sent in

## 2019-01-14 NOTE — Telephone Encounter (Signed)
Let's do losartan 50 mg/d if they have it. OK to send in if they do and update med list. Ty.

## 2019-01-14 NOTE — Telephone Encounter (Signed)
Fax from pharmacy informing that Valsartan on a long term manufacturer backorder. Advise on change of medication

## 2019-02-19 MED FILL — metFORMIN HCL 1000 MG TABS: 1000 | 30 days supply | Qty: 60 | Fill #2

## 2019-02-19 MED FILL — LOSARTAN POTASSIUM 50 MG TA: 50 | 30 days supply | Qty: 30 | Fill #1

## 2019-02-19 MED FILL — JARDIANCE 25 MG TABLET: 25 | 30 days supply | Qty: 30 | Fill #1

## 2019-02-19 MED FILL — FLUVASTATIN NA 40 MG CAP: 40 | 30 days supply | Qty: 30 | Fill #4

## 2019-03-24 MED FILL — FLUVASTATIN NA 40 MG CAP: 40 | 30 days supply | Qty: 30 | Fill #5

## 2019-03-24 MED FILL — JARDIANCE 25 MG TABLET: 25 | 30 days supply | Qty: 30 | Fill #2

## 2019-03-24 MED FILL — LOSARTAN POTASSIUM 50 MG TA: 50 | 30 days supply | Qty: 30 | Fill #2

## 2019-03-29 ENCOUNTER — Encounter: Payer: Self-pay | Admitting: Family Medicine

## 2019-03-31 ENCOUNTER — Encounter: Payer: Self-pay | Admitting: Family Medicine

## 2019-03-31 ENCOUNTER — Ambulatory Visit: Payer: 59 | Admitting: Family Medicine

## 2019-03-31 ENCOUNTER — Other Ambulatory Visit: Payer: Self-pay

## 2019-03-31 VITALS — BP 120/68 | HR 76 | Temp 97.9°F | Ht 68.0 in | Wt 170.0 lb

## 2019-03-31 DIAGNOSIS — M545 Low back pain, unspecified: Secondary | ICD-10-CM

## 2019-03-31 MED ORDER — MELOXICAM 15 MG PO TABS: 15.0000 mg | ORAL_TABLET | Freq: Every day | ORAL | 0 refills | Status: DC

## 2019-03-31 MED ORDER — CYCLOBENZAPRINE HCL 10 MG PO TABS: 5.0000 mg | ORAL_TABLET | Freq: Three times a day (TID) | ORAL | 0 refills | Status: DC | PRN

## 2019-03-31 MED FILL — CYCLOBENZAPRINE HCL 10 MG T: 10 | 7 days supply | Qty: 21 | Fill #0

## 2019-03-31 MED FILL — MELOXICAM 15 MG TABLET: 15 | 30 days supply | Qty: 30 | Fill #0

## 2019-03-31 NOTE — Patient Instructions (Signed)

## 2019-03-31 NOTE — Progress Notes (Signed)
Musculoskeletal Exam  Patient: Shawn Perez DOB: 08-06-1966  DOS: 03/31/2019  SUBJECTIVE:  Chief Complaint:   Chief Complaint  Patient presents with  . Back Pain  . Fall    Shawn Perez is a 53 y.o.  male for evaluation and treatment of his back pain.   Onset:  4 days ago.  Had a fall going down stairs and slipped.  Location: lower, both sides Character:  sharp  8/10 severity Progression of issue:  is unchanged Associated symptoms: tingling down both legs Denies bowel/bladder incontinence or weakness Treatment: to date has been: none.   Neurovascular symptoms: no  ROS: Musculoskeletal/Extremities: +back pain Neurologic: no numbness, tingling no weakness   Past Medical History:  Diagnosis Date  . Anxiety   . Diabetes mellitus without complication ([place])    type 2  . Family history of adverse reaction to anesthesia    [name] has trouble waking up both times under anesthesia diagnosed with sleep apnea  . Heart murmur    since birth  . Hyperlipidemia 2011  . Hypertension   . Sleep apnea    wears cpap  . Temporomandibular jaw dysfunction     Objective:  VITAL SIGNS: BP 120/68 (BP Location: Left Arm, Patient Position: Sitting, Cuff Size: Normal)   Pulse 76   Temp 97.9 F (36.6 C) (Oral)   Ht 5\' 8"  (1.727 m)   Wt 170 lb (77.1 kg)   SpO2 96%   BMI 25.85 kg/m  Constitutional: Well formed, well developed. No acute distress. HENT: Normocephalic, atraumatic.  Thorax & Lungs:  No accessory muscle use Skin: Warm. Dry. No erythema. No rash.  Musculoskeletal: low back.   Tenderness to palpation: yes Deformity: no Ecchymosis: no Straight leg test: negative for Poor hamstring flexibility b/l. Neurologic: Normal sensory function. No focal deficits noted. DTR's equal and symmetric in LE's. No clonus. Psychiatric: Normal mood. Age appropriate judgment and insight. Alert & oriented x 3.    Assessment:  Acute bilateral low back pain without sciatica - Plan:  meloxicam (MOBIC) 15 MG tablet, cyclobenzaprine (FLEXERIL) 10 MG tablet, heat, ice, Tylenol, stretches/exercises.   Plan: Orders as above. F/u prn. The patient voiced understanding and agreement to the plan.   [place], DO 03/31/19  8:51 AM

## 2019-04-21 ENCOUNTER — Other Ambulatory Visit: Payer: Self-pay | Admitting: Family Medicine

## 2019-04-21 DIAGNOSIS — E785 Hyperlipidemia, unspecified: Secondary | ICD-10-CM

## 2019-04-21 MED FILL — FLUVASTATIN NA 40 MG CAP: 40 | 30 days supply | Qty: 30 | Fill #0

## 2019-04-21 MED FILL — LOSARTAN POTASSIUM 50 MG TA: 50 | 30 days supply | Qty: 30 | Fill #3

## 2019-04-21 MED FILL — JARDIANCE 25 MG TABLET: 25 | 30 days supply | Qty: 30 | Fill #3

## 2019-04-21 MED FILL — metFORMIN HCL 1000 MG TABS: 1000 | 30 days supply | Qty: 60 | Fill #3

## 2019-05-26 ENCOUNTER — Other Ambulatory Visit: Payer: Self-pay | Admitting: Family Medicine

## 2019-06-25 MED FILL — FLUVASTATIN NA 40 MG CAP: 40 | 30 days supply | Qty: 30 | Fill #2

## 2019-06-25 MED FILL — JARDIANCE 25 MG TABLET: 25 | 30 days supply | Qty: 30 | Fill #5

## 2019-06-25 MED FILL — LOSARTAN POTASSIUM 50 MG TA: 50 | 30 days supply | Qty: 30 | Fill #1

## 2019-06-29 MED FILL — metFORMIN HCL 1000 MG TABS: 1000 | 30 days supply | Qty: 60 | Fill #1

## 2019-07-30 ENCOUNTER — Other Ambulatory Visit: Payer: Self-pay | Admitting: Family Medicine

## 2019-07-30 MED FILL — metFORMIN HCL 1000 MG TABS: 1000 | 30 days supply | Qty: 60 | Fill #2

## 2019-07-30 MED FILL — LOSARTAN POTASSIUM 50 MG TA: 50 | 30 days supply | Qty: 30 | Fill #2

## 2019-07-30 MED FILL — FLUVASTATIN NA 40 MG CAP: 40 | 30 days supply | Qty: 30 | Fill #3

## 2019-07-31 MED FILL — JARDIANCE 25 MG TABLET: 25 | 30 days supply | Qty: 30 | Fill #0

## 2019-08-31 ENCOUNTER — Other Ambulatory Visit: Payer: Self-pay

## 2019-08-31 ENCOUNTER — Ambulatory Visit (INDEPENDENT_AMBULATORY_CARE_PROVIDER_SITE_OTHER): Payer: 59 | Admitting: Family Medicine

## 2019-08-31 ENCOUNTER — Encounter: Payer: Self-pay | Admitting: Family Medicine

## 2019-08-31 DIAGNOSIS — Z20822 Contact with and (suspected) exposure to covid-19: Secondary | ICD-10-CM

## 2019-08-31 DIAGNOSIS — R062 Wheezing: Secondary | ICD-10-CM | POA: Diagnosis not present

## 2019-08-31 DIAGNOSIS — M791 Myalgia, unspecified site: Secondary | ICD-10-CM

## 2019-08-31 MED ORDER — PREDNISONE 20 MG PO TABS: 40.0000 mg | ORAL_TABLET | Freq: Every day | ORAL | 0 refills | Status: AC

## 2019-08-31 MED FILL — predniSONE 20 MG TABS: 20 | 5 days supply | Qty: 10 | Fill #0

## 2019-08-31 NOTE — Progress Notes (Signed)
CC: Covid exposure  Subjective: Patient is a 54 y.o. male here for achiness. Due to COVID-19 pandemic, we are interacting via web portal for an electronic face-to-face visit. I verified patient's ID using 2 identifiers. Patient agreed to proceed with visit via this method. Patient is in a parked car, I am at home. Patient and I are present for visit.   6 d ago, pt was exposed to someone at office who confirmed to have tested positive for covid yesterday. Yesterday he also developed a scratchy throat, slight chest tightness, slight wheeze/sob, and achy upper torso. Chest pains are not exertional. He is not having any cough, N/V/D, abd pain, fevers, itchy/watery eyes, stuffy nose, ST, or ear pain/drainage from his ears. Smell/taste is currently intact. Used heating pad at home.   ROS: Heart: Denies chest pain  Lungs: Denies SOB   Past Medical History:  Diagnosis Date  . Anxiety   . Diabetes mellitus without complication ([place])    type 2  . Family history of adverse reaction to anesthesia    [name] has trouble waking up both times under anesthesia diagnosed with sleep apnea  . Heart murmur    since birth  . Hyperlipidemia 2011  . Hypertension   . Sleep apnea    wears cpap  . Temporomandibular jaw dysfunction     Objective: No conversational dyspnea Age appropriate judgment and insight Nml affect and mood  Assessment and Plan: Wheezing - Plan: predniSONE (DELTASONE) 20 MG tablet  Myalgia - Plan: predniSONE (DELTASONE) 20 MG tablet  Close exposure to COVID-19 virus  Info to sched appt for covid testing thru [facility] provided. Supportive care. Tylenol, heat, pred burst. He will let us know how his sugars do on it, may call in glyburide for prn use. Counseled on precautions and social distancing/mask wearing, etc.  Fu prn otherwise.  The patient voiced understanding and agreement to the plan.  [place], DO 08/31/19  4:32 PM

## 2019-09-01 MED FILL — LOSARTAN POTASSIUM 50 MG TA: 50 | 30 days supply | Qty: 30 | Fill #3

## 2019-09-01 MED FILL — FLUVASTATIN NA 40 MG CAP: 40 | 30 days supply | Qty: 30 | Fill #4

## 2019-09-01 MED FILL — LUMIGAN 0.01% EYE DROPS: 0.01 | 25 days supply | Qty: 3 | Fill #0

## 2019-09-04 ENCOUNTER — Other Ambulatory Visit: Payer: 59

## 2019-09-04 ENCOUNTER — Ambulatory Visit: Payer: 59 | Attending: Internal Medicine

## 2019-09-04 DIAGNOSIS — Z20822 Contact with and (suspected) exposure to covid-19: Secondary | ICD-10-CM

## 2019-09-06 ENCOUNTER — Encounter: Payer: Self-pay | Admitting: Family Medicine

## 2019-09-06 LAB — NOVEL CORONAVIRUS, NAA: SARS-CoV-2, NAA: DETECTED — AB

## 2019-09-07 ENCOUNTER — Other Ambulatory Visit: Payer: Self-pay

## 2019-09-07 ENCOUNTER — Other Ambulatory Visit: Payer: Self-pay | Admitting: Nurse Practitioner

## 2019-09-07 ENCOUNTER — Ambulatory Visit (INDEPENDENT_AMBULATORY_CARE_PROVIDER_SITE_OTHER): Payer: 59 | Admitting: Family

## 2019-09-07 ENCOUNTER — Telehealth: Payer: Self-pay | Admitting: Infectious Diseases

## 2019-09-07 DIAGNOSIS — U071 COVID-19: Secondary | ICD-10-CM

## 2019-09-07 DIAGNOSIS — E119 Type 2 diabetes mellitus without complications: Secondary | ICD-10-CM

## 2019-09-07 MED ORDER — ALBUTEROL SULFATE HFA 108 (90 BASE) MCG/ACT IN AERS: 2.0000 | INHALATION_SPRAY | Freq: Four times a day (QID) | RESPIRATORY_TRACT | 0 refills | Status: DC | PRN

## 2019-09-07 MED ORDER — BENZONATATE 100 MG PO CAPS: 100.0000 mg | ORAL_CAPSULE | Freq: Three times a day (TID) | ORAL | 0 refills | Status: DC | PRN

## 2019-09-07 MED FILL — BENZONATATE 100 MG CAPS: 100 | 7 days supply | Qty: 20 | Fill #0

## 2019-09-07 MED FILL — VENTOLIN HFA 90 MCG INHALER: 108 (90 BAS | 25 days supply | Qty: 18 | Fill #0

## 2019-09-07 NOTE — Progress Notes (Signed)
Virtual Visit via Video Note  I connected with Shawn Perez on 09/07/19 at 12:00 PM EST by a video enabled telemedicine application and verified that I am speaking with the correct person using two identifiers.  Location: Patient: home Provider: home   I discussed the limitations of evaluation and management by telemedicine and the availability of in person appointments. The patient expressed understanding and agreed to proceed.  History of Present Illness:  Patient is a 54 yr old male who presents today following + covid-19 result last week. Was tested Friday AM, symptoms started last wed with weakness/fatigue/headache.  Report temp 100.3 yesterday, 99 today without tylenol.  + HA and dry cough today, dry congestion. Breathing feels a little tight.  Using mucinex dm without significant improvement. Denies associated wheezing. Mild sob. + sweats/chills. Wife also has COVID.  They visited their daughter who was diagnosed with covid after their visit.     Past Medical History:  Diagnosis Date  . Anxiety   . Diabetes mellitus without complication ([place])    type 2  . Family history of adverse reaction to anesthesia    [name] has trouble waking up both times under anesthesia diagnosed with sleep apnea  . Heart murmur    since birth  . Hyperlipidemia 2011  . Hypertension   . Sleep apnea    wears cpap  . Temporomandibular jaw dysfunction      Social History   Socioeconomic History  . Marital status: Married    Spouse name: Jeani Hawking  . Number of children: Not on file  . Years of education: Not on file  . Highest education level: Not on file  Occupational History    Employer: [employer]  Tobacco Use  . Smoking status: Former Research scientist (life sciences)  . Smokeless tobacco: Never Used  . Tobacco comment: quit in 2000 smike 1 ppd  Substance and Sexual Activity  . Alcohol use: Yes    Alcohol/week: 2.0 - 3.0 standard drinks    Types: 2 - 3 Glasses of wine per week    Comment: daily  . Drug  use: No    Comment: cbd oil  . Sexual activity: Yes  Other Topics Concern  . Not on file  Social History Narrative  . Not on file   Social Determinants of Health   Financial Resource Strain:   . Difficulty of Paying Living Expenses: Not on file  Food Insecurity:   . Worried About Charity fundraiser in the Last Year: Not on file  . Ran Out of Food in the Last Year: Not on file  Transportation Needs:   . Lack of Transportation (Medical): Not on file  . Lack of Transportation (Non-Medical): Not on file  Physical Activity:   . Days of Exercise per Week: Not on file  . Minutes of Exercise per Session: Not on file  Stress:   . Feeling of Stress : Not on file  Social Connections:   . Frequency of Communication with Friends and Family: Not on file  . Frequency of Social Gatherings with Friends and Family: Not on file  . Attends Religious Services: Not on file  . Active Member of Clubs or Organizations: Not on file  . Attends Archivist Meetings: Not on file  . Marital Status: Not on file  Intimate Partner Violence:   . Fear of Current or Ex-Partner: Not on file  . Emotionally Abused: Not on file  . Physically Abused: Not on file  . Sexually Abused:  Not on file    Past Surgical History:  Procedure Laterality Date  . COLONOSCOPY    . INGUINAL HERNIA REPAIR N/A 04/09/2018   Procedure: LAPAROSCOPIC RIGHT INGUINAL AND BILATERAL FEMORAL HERNIA REPAIR;  Surgeon: Michael Boston, MD;  Location: [facility];  Service: General;  Laterality: N/A;  . INSERTION OF MESH N/A 04/09/2018   Procedure: INSERTION OF MESH;  Surgeon: Michael Boston, MD;  Location: [facility];  Service: General;  Laterality: N/A;  . NO PAST SURGERIES  07/09/2011   Denies surgical history  . WISDOM TOOTH EXTRACTION      Family History  Problem Relation Age of Onset  . Hypertension Mother   . Diabetes Mother   . Early death Mother   . Heart disease Mother   . Heart disease Sister   . Early death Brother   .  Heart disease Brother   . Hyperlipidemia Brother   . Hypertension Brother   . Early death Maternal Aunt   . Heart disease Maternal Aunt   . Early death Maternal Uncle   . Heart disease Maternal Uncle   . Heart disease Maternal Grandmother   . Early death Maternal Grandmother   . Heart disease Brother     Allergies  Allergen Reactions  . Fenofibrate Rash  . Penicillins Rash    Childhood allergy Has patient had a PCN reaction causing immediate rash, facial/tongue/throat swelling, SOB or lightheadedness with hypotension: Yes Has patient had a PCN reaction causing severe rash involving mucus membranes or skin necrosis: No Has patient had a PCN reaction that required hospitalization: No Has patient had a PCN reaction occurring within the last 10 years: No If all of the above answers are "NO", then may proceed with Cephalosporin use.     Current Outpatient Medications on File Prior to Visit  Medication Sig Dispense Refill  . Blood Glucose Monitoring Suppl (ONE TOUCH ULTRA SYSTEM KIT) W/DEVICE KIT 1 kit by Does not apply route once. 1 each 0  . cyclobenzaprine (FLEXERIL) 10 MG tablet Take 0.5-1 tablets (5-10 mg total) by mouth 3 (three) times daily as needed for muscle spasms. 21 tablet 0  . diclofenac (VOLTAREN) 75 MG EC tablet Take 1 tablet (75 mg total) by mouth 2 (two) times daily. 30 tablet 0  . diclofenac sodium (VOLTAREN) 1 % GEL Apply 2 g topically 4 (four) times daily as needed. 3 Tube 2  . fluvastatin (LESCOL) 40 MG capsule TAKE 1 CAPSULE (40 MG TOTAL) BY MOUTH DAILY. PATIENT NEEDS OFFICE VISIT PRIOR TO ANYMORE REFILLS. 90 capsule 1  . glucose blood (ONE TOUCH ULTRA TEST) test strip TEST 1 TIME DAILY. 100 each 1  . JARDIANCE 25 MG TABS tablet TAKE 1 TABLET BY MOUTH ONCE DAILY 30 tablet 2  . Lancets (ONETOUCH ULTRASOFT) lancets Use as instructed 100 each 12  . latanoprost (XALATAN) 0.005 % ophthalmic solution Place 1 drop into both eyes at bedtime.     Marland Kitchen losartan (COZAAR) 50 MG  tablet TAKE 1 TABLET BY MOUTH ONCE DAILY 30 tablet 3  . meloxicam (MOBIC) 15 MG tablet Take 1 tablet (15 mg total) by mouth daily. 30 tablet 0  . metFORMIN (GLUCOPHAGE) 1000 MG tablet TAKE 1 TABLET BY MOUTH TWICE DAILY WITH A MEAL 60 tablet 3  . vitamin B-12 (CYANOCOBALAMIN) 1000 MCG tablet TAKE 1 TABLET BY MOUTH DAILY FOR SEVEN DAYS, THEN 1 TABLET WEEKLY THERAFTER 130 tablet 0   No current facility-administered medications on file prior to visit.    There were  no vitals taken for this visit.      Observations/Objective:   Gen: Awake, alert, no acute distress Resp: Breathing is even and non-labored Psych: calm/pleasant demeanor Neuro: Alert and Oriented x 3, + facial symmetry, speech is clear.   Assessment and Plan:  COVID-19 infection- will add tessalon prn cough, albuterol as needed.  Temp seems to be improving.  I do not see indication for antibiotics at this time but have asked the pt to reach out to [place] if recurrent fever, increasing sob or worsening cough. He verbalizes understanding. I have also left a message requesting that he be evaluated for IV infusion of monoclonal antibody due to hx of DM2.   Patient was advised to quarantine as follows following positive COVID-19 result:  At least 10 days have passed since symptom onset and At least 24 hours have passed since resolution of fever without the use of fever-reducing medications and other symptoms have improved.  Follow Up Instructions:    I discussed the assessment and treatment plan with the patient. The patient was provided an opportunity to ask questions and all were answered. The patient agreed with the plan and demonstrated an understanding of the instructions.   The patient was advised to call back or seek an in-person evaluation if the symptoms worsen or if the condition fails to improve as anticipated.  Nance Pear, NP

## 2019-09-07 NOTE — Telephone Encounter (Signed)
error 

## 2019-09-07 NOTE — Progress Notes (Signed)
  I connected by phone with Shawn Perez on 09/07/2019 at 4:10 PM to discuss the potential use of an new treatment for mild to moderate COVID-19 viral infection in non-hospitalized patients.  This patient is a 54 y.o. male that meets the FDA criteria for Emergency Use Authorization of bamlanivimab or casirivimab\imdevimab.  Has a (+) direct SARS-CoV-2 viral test result  Has mild or moderate COVID-19   Is ? 56 years of age and weighs ? 40 kg  Is NOT hospitalized due to COVID-19  Is NOT requiring oxygen therapy or requiring an increase in baseline oxygen flow rate due to COVID-19  Is within 10 days of symptom onset  Has at least one of the high risk factor(s) for progression to severe COVID-19 and/or hospitalization as defined in EUA.  Specific high risk criteria : Diabetes   I have spoken and communicated the following to the patient or parent/caregiver:  1. FDA has authorized the emergency use of bamlanivimab and casirivimab\imdevimab for the treatment of mild to moderate COVID-19 in adults and pediatric patients with positive results of direct SARS-CoV-2 viral testing who are 30 years of age and older weighing at least 40 kg, and who are at high risk for progressing to severe COVID-19 and/or hospitalization.  2. The significant known and potential risks and benefits of bamlanivimab and casirivimab\imdevimab, and the extent to which such potential risks and benefits are unknown.  3. Information on available alternative treatments and the risks and benefits of those alternatives, including clinical trials.  4. Patients treated with bamlanivimab and casirivimab\imdevimab should continue to self-isolate and use infection control measures (e.g., wear mask, isolate, social distance, avoid sharing personal items, clean and disinfect "high touch" surfaces, and frequent handwashing) according to CDC guidelines.   5. The patient or parent/caregiver has the option to accept or refuse  bamlanivimab or casirivimab\imdevimab .  After reviewing this information with the patient, The patient agreed to proceed with receiving the bamlanimivab infusion and will be provided a copy of the Fact sheet prior to receiving the infusion.[name] 09/07/2019 4:10 PM

## 2019-09-10 ENCOUNTER — Ambulatory Visit (HOSPITAL_COMMUNITY): Payer: 59

## 2019-09-15 MED FILL — JARDIANCE 25 MG TABLET: 25 | 30 days supply | Qty: 30 | Fill #1

## 2019-09-25 ENCOUNTER — Other Ambulatory Visit: Payer: Self-pay | Admitting: Family Medicine

## 2019-09-25 MED FILL — FLUVASTATIN NA 40 MG CAP: 40 | 30 days supply | Qty: 30 | Fill #5

## 2019-09-25 MED FILL — LOSARTAN POTASSIUM 50 MG TA: 50 | 30 days supply | Qty: 30 | Fill #0

## 2019-10-12 MED FILL — JARDIANCE 25 MG TABLET: 25 | 30 days supply | Qty: 30 | Fill #2

## 2019-11-04 ENCOUNTER — Other Ambulatory Visit: Payer: Self-pay | Admitting: Family Medicine

## 2019-11-04 DIAGNOSIS — E785 Hyperlipidemia, unspecified: Secondary | ICD-10-CM

## 2019-11-04 MED FILL — LOSARTAN POTASSIUM 50 MG TA: 50 | 30 days supply | Qty: 30 | Fill #1

## 2019-11-04 MED FILL — FLUVASTATIN NA 40 MG CAP: 40 | 30 days supply | Qty: 30 | Fill #0

## 2019-11-18 ENCOUNTER — Other Ambulatory Visit: Payer: Self-pay | Admitting: Family Medicine

## 2019-11-18 MED FILL — JARDIANCE 25 MG TABLET: 25 | 30 days supply | Qty: 30 | Fill #0

## 2019-11-24 ENCOUNTER — Other Ambulatory Visit: Payer: Self-pay | Admitting: Family Medicine

## 2019-11-24 DIAGNOSIS — E119 Type 2 diabetes mellitus without complications: Secondary | ICD-10-CM

## 2019-11-25 MED FILL — ONE TOUCH ULTRA TEST STRIPS: 25 days supply | Qty: 25 | Fill #0

## 2019-12-08 MED FILL — FLUVASTATIN NA 40 MG CAP: 40 | 30 days supply | Qty: 30 | Fill #1

## 2019-12-08 MED FILL — LOSARTAN POTASSIUM 50 MG TA: 50 | 30 days supply | Qty: 30 | Fill #2

## 2019-12-08 MED FILL — METFORMIN HCL 1000 MG TABS: 1000 | 30 days supply | Qty: 60 | Fill #3

## 2019-12-11 MED FILL — JARDIANCE 25 MG TABLET: 25 | 30 days supply | Qty: 30 | Fill #1

## 2019-12-16 ENCOUNTER — Other Ambulatory Visit: Payer: Self-pay

## 2019-12-16 ENCOUNTER — Ambulatory Visit (INDEPENDENT_AMBULATORY_CARE_PROVIDER_SITE_OTHER): Payer: 59 | Admitting: Family Medicine

## 2019-12-16 ENCOUNTER — Encounter: Payer: Self-pay | Admitting: Family Medicine

## 2019-12-16 ENCOUNTER — Other Ambulatory Visit: Payer: Self-pay | Admitting: Family Medicine

## 2019-12-16 VITALS — BP 122/84 | HR 71 | Temp 96.0°F | Ht 68.0 in | Wt 173.4 lb

## 2019-12-16 DIAGNOSIS — N401 Enlarged prostate with lower urinary tract symptoms: Secondary | ICD-10-CM

## 2019-12-16 DIAGNOSIS — Z125 Encounter for screening for malignant neoplasm of prostate: Secondary | ICD-10-CM | POA: Diagnosis not present

## 2019-12-16 DIAGNOSIS — E1165 Type 2 diabetes mellitus with hyperglycemia: Secondary | ICD-10-CM | POA: Diagnosis not present

## 2019-12-16 DIAGNOSIS — Z0000 Encounter for general adult medical examination without abnormal findings: Secondary | ICD-10-CM | POA: Diagnosis not present

## 2019-12-16 DIAGNOSIS — Z23 Encounter for immunization: Secondary | ICD-10-CM | POA: Diagnosis not present

## 2019-12-16 DIAGNOSIS — E785 Hyperlipidemia, unspecified: Secondary | ICD-10-CM | POA: Diagnosis not present

## 2019-12-16 DIAGNOSIS — M7711 Lateral epicondylitis, right elbow: Secondary | ICD-10-CM

## 2019-12-16 MED ORDER — METFORMIN HCL 1000 MG PO TABS: ORAL_TABLET | ORAL | 3 refills | Status: DC

## 2019-12-16 MED ORDER — FLUVASTATIN SODIUM 40 MG PO CAPS: 40.0000 mg | ORAL_CAPSULE | Freq: Every day | ORAL | 2 refills | Status: DC

## 2019-12-16 MED ORDER — TAMSULOSIN HCL 0.4 MG PO CAPS: 0.4000 mg | ORAL_CAPSULE | Freq: Every day | ORAL | 5 refills | Status: DC

## 2019-12-16 MED FILL — TAMSULOSIN HCL 0.4 MG CAP: 0.4 | 30 days supply | Qty: 30 | Fill #0

## 2019-12-16 NOTE — Progress Notes (Addendum)
Chief Complaint  Patient presents with  . Annual Exam    Well Male Shawn Perez is here for a complete physical.   His last physical was >1 year ago.  Current diet: in general, a "healthy" diet.  Current exercise: walking Weight trend: stable Daytime fatigue? No. Seat belt? Yes.    Health maintenance Shingrix- No Colonoscopy- Yes Tetanus- No HIV- Yes Hep C- Yes   Patient has had several weeks of left outer elbow pain.  He does work with maintenance but there was no specific injury or change in activity.  No bruising, swelling, or redness.  He has not tried anything so far.  Patient has a 1 month history of pressure in the perineal region.  He also has subsequent urinary flow issues and incomplete emptying.  He is not having any fevers, bleeding, or discharge.  No injuries.  Addendum:  Pt w very high TG's. With his hx of DM and the fact that he was already on a statin, fenofibrate was added in the past. Unfortunately he did not tolerate this as it gave him a rash. He has never been on Vascepa.    Past Medical History:  Diagnosis Date  . Anxiety   . Diabetes mellitus without complication ([place])    type 2  . Family history of adverse reaction to anesthesia    [name] has trouble waking up both times under anesthesia diagnosed with sleep apnea  . Heart murmur    since birth  . Hyperlipidemia 2011  . Hypertension   . Sleep apnea    wears cpap  . Temporomandibular jaw dysfunction       Past Surgical History:  Procedure Laterality Date  . COLONOSCOPY    . INGUINAL HERNIA REPAIR N/A 04/09/2018   Procedure: LAPAROSCOPIC RIGHT INGUINAL AND BILATERAL FEMORAL HERNIA REPAIR;  Surgeon: Michael Boston, MD;  Location: [facility];  Service: General;  Laterality: N/A;  . INSERTION OF MESH N/A 04/09/2018   Procedure: INSERTION OF MESH;  Surgeon: Michael Boston, MD;  Location: [facility];  Service: General;  Laterality: N/A;  . NO PAST SURGERIES  07/09/2011   Denies surgical history  .  WISDOM TOOTH EXTRACTION      Medications  Current Outpatient Medications on File Prior to Visit  Medication Sig Dispense Refill  . albuterol (VENTOLIN HFA) 108 (90 Base) MCG/ACT inhaler Inhale 2 puffs into the lungs every 6 (six) hours as needed for wheezing or shortness of breath. 6.7 g 0  . benzonatate (TESSALON) 100 MG capsule Take 1 capsule (100 mg total) by mouth 3 (three) times daily as needed. 20 capsule 0  . Blood Glucose Monitoring Suppl (ONE TOUCH ULTRA SYSTEM KIT) W/DEVICE KIT 1 kit by Does not apply route once. 1 each 0  . cyclobenzaprine (FLEXERIL) 10 MG tablet Take 0.5-1 tablets (5-10 mg total) by mouth 3 (three) times daily as needed for muscle spasms. 21 tablet 0  . diclofenac (VOLTAREN) 75 MG EC tablet Take 1 tablet (75 mg total) by mouth 2 (two) times daily. 30 tablet 0  . diclofenac sodium (VOLTAREN) 1 % GEL Apply 2 g topically 4 (four) times daily as needed. 3 Tube 2  . fluvastatin (LESCOL) 40 MG capsule TAKE 1 CAPSULE (40 MG TOTAL) BY MOUTH DAILY. PATIENT NEEDS OFFICE VISIT PRIOR TO ANYMORE REFILLS. 30 capsule 1  . JARDIANCE 25 MG TABS tablet TAKE 1 TABLET BY MOUTH ONCE DAILY 30 tablet 2  . Lancets (ONETOUCH ULTRASOFT) lancets Use as instructed 100 each 12  .  latanoprost (XALATAN) 0.005 % ophthalmic solution Place 1 drop into both eyes at bedtime.     Marland Kitchen losartan (COZAAR) 50 MG tablet TAKE 1 TABLET BY MOUTH ONCE DAILY 30 tablet 3  . meloxicam (MOBIC) 15 MG tablet Take 1 tablet (15 mg total) by mouth daily. 30 tablet 0  . metFORMIN (GLUCOPHAGE) 1000 MG tablet TAKE 1 TABLET BY MOUTH TWICE DAILY WITH A MEAL 60 tablet 3  . ONETOUCH ULTRA test strip USE AS DIRECTED TO TEST BLOOD SUGAR 1 TIME DAILY 25 strip 1  . vitamin B-12 (CYANOCOBALAMIN) 1000 MCG tablet TAKE 1 TABLET BY MOUTH DAILY FOR SEVEN DAYS, THEN 1 TABLET WEEKLY THERAFTER 130 tablet 0   Allergies Allergies  Allergen Reactions  . Fenofibrate Rash  . Penicillins Rash    Childhood allergy Has patient had a PCN  reaction causing immediate rash, facial/tongue/throat swelling, SOB or lightheadedness with hypotension: Yes Has patient had a PCN reaction causing severe rash involving mucus membranes or skin necrosis: No Has patient had a PCN reaction that required hospitalization: No Has patient had a PCN reaction occurring within the last 10 years: No If all of the above answers are "NO", then may proceed with Cephalosporin use.     Family History Family History  Problem Relation Age of Onset  . Hypertension Mother   . Diabetes Mother   . Early death Mother   . Heart disease Mother   . Heart disease Sister   . Early death Brother   . Heart disease Brother   . Hyperlipidemia Brother   . Hypertension Brother   . Early death Maternal Aunt   . Heart disease Maternal Aunt   . Early death Maternal Uncle   . Heart disease Maternal Uncle   . Heart disease Maternal Grandmother   . Early death Maternal Grandmother   . Heart disease Brother     Review of Systems: Constitutional:  no fevers Eye:  no recent significant change in vision Ear/Nose/Mouth/Throat:  Ears:  no hearing loss Nose/Mouth/Throat:  no complaints of nasal congestion, no sore throat Cardiovascular:  no chest pain, no palpitations Respiratory:  no cough and no shortness of breath Gastrointestinal:  no abdominal pain, no change in bowel habits GU:  Male: negative for dysuria, frequency, and incontinence and negative for prostate symptoms Musculoskeletal/Extremities:  no pain, redness, or swelling of the joints Integumentary (Skin/Breast):  no abnormal skin lesions reported Neurologic:  no headaches Endocrine: No unexpected weight changes Hematologic/Lymphatic:  no abnormal bleeding  Exam BP 122/84 (BP Location: Left Arm, Patient Position: Sitting, Cuff Size: Normal)   Pulse 71   Temp (!) 96 F (35.6 C) (Temporal)   Ht '5\' 8"'  (1.727 m)   Wt 173 lb 6 oz (78.6 kg)   SpO2 96%   BMI 26.36 kg/m  General:  well developed, well  nourished, in no apparent distress Skin:  no significant moles, warts, or growths; no abnormal lesions in the feet bilaterally Head:  no masses, lesions, or tenderness Eyes:  pupils equal and round, sclera anicteric without injection Ears:  canals without lesions, TMs shiny without retraction, no obvious effusion, no erythema Nose:  nares patent, septum midline, mucosa normal Throat/Pharynx:  lips and gingiva without lesion; tongue and uvula midline; non-inflamed pharynx; no exudates or postnasal drainage Neck: neck supple without adenopathy, thyromegaly, or masses Cardiac: RRR, no bruits, no LE edema Lungs:  clear to auscultation, breath sounds equal bilaterally, no respiratory distress Rectal: Sphincter of good tone, prostate is enlarged but smooth,  no bogginess or tenderness to palpation Musculoskeletal:  symmetrical muscle groups noted without atrophy or deformity Neuro:  gait normal; deep tendon reflexes normal and symmetric; sensation intact to pinprick bilaterally in feet Psych: well oriented with normal range of affect and appropriate judgment/insight  Assessment and Plan  Well adult exam - Plan: CBC, Comprehensive metabolic panel, Lipid panel  Screening for prostate cancer - Plan: PSA  Type 2 diabetes mellitus with hyperglycemia, without long-term current use of insulin (HCC) - Plan: Hemoglobin A1c  Hyperlipidemia, unspecified hyperlipidemia type - Plan: fluvastatin (LESCOL) 40 MG capsule  Benign prostatic hyperplasia with lower urinary tract symptoms, symptom details unspecified  Lateral epicondylitis of right elbow  Need for Tdap vaccination - Plan: Tdap vaccine greater than or equal to 61yo IM   Well 54 y.o. male. Counseled on diet and exercise. Counseled on risks and benefits of prostate cancer screening with PSA. The patient agrees to undergo testing. Forearm strap and stretches and exercises, ice for the elbow. Start Flomax. Immunizations, labs, and further orders as  above. Follow up 6 weeks to recheck elbow pain and prostate.  If elbow is no better, will inject with steroid and consider physical therapy.  If prostate no better, will refer to urology versus increased dose of Flomax or add finasteride. The patient voiced understanding and agreement to the plan.  Addendum:  Pt w very high TG's. With his hx of DM and the fact that he was already on a statin, fenofibrate was added in the past. Unfortunately he did not tolerate this as it gave him a rash. He has never been on Vascepa.   [place], DO 12/16/19 4:38 PM

## 2019-12-16 NOTE — Patient Instructions (Addendum)
Give [place] 2-3 business days to get the results of your labs back.   The new Shingrix vaccine (for shingles) is a 2 shot series. It can make people feel low energy, achy and almost like they have the flu for 48 hours after injection. Please plan accordingly when deciding on when to get this shot. Call our office for a nurse visit appointment to get this. The second shot of the series is less severe regarding the side effects, but it still lasts 48 hours. Don't get this shot within   Call your eye doctor for an appointment.  Ice/cold pack over area for 10-15 min twice daily.  OK to take Tylenol 1000 mg (2 extra strength tabs) or 975 mg (3 regular strength tabs) every 6 hours as needed.  Let us know if you need anything.  Consider a forearm strap (Band-IT) to help with your elbow. This can give your elbow a break and allow it to heal faster.   Elbow and Forearm Exercises It is normal to feel mild stretching, pulling, tightness, or discomfort as you do these exercises, but you should stop right away if you feel sudden pain or your pain gets worse. RANGE OF MOTION EXERCISES These exercises warm up your muscles and joints and improve the movement and flexibility of your injured elbow and forearm. These exercises also help to relieve pain, numbness, and tingling.These exercises are done using the muscles in your injured elbow and forearm. Exercise A: Elbow Flexion, Active 1. Hold your left / right arm at your side, and bend your elbow as far as you can using your left / right arm muscles. 2. Hold this position for 30 seconds. 3. Slowly return to the starting position. Repeat 2 times. Complete this exercise 3 times per week. Exercise B: Elbow Extension, Active 1. Hold your left / right arm at your side, and straighten your elbow as much as you can using your left / right arm muscles. 2. Hold this position for 30 seconds. 3. Slowly return to the starting position. Repeat 2 times. Complete this  exercise 3 times per week. Exercise C: Forearm Rotation, Supination, Active 1. Stand or sit with your elbows at your sides. 2. Bend your left / right elbow to an "L" shape (90 degrees). 3. Turn your palm upward until you feel a gentle stretch on the inside of your forearm. 4. Hold this position for 30 seconds. 5. Slowly release and return to the starting position. Repeat 2 times. Complete this exercise 3 times per week. Exercise D: Forearm Rotation, Pronation, Active 1. Stand or sit with your elbows at your side. 2. Bend your left / right elbow to an "L" shape (90 degrees). 3. Turn your left / right palm downward until you feel a gentle stretch on the top of your forearm. 4. Hold this position for 30 seconds. 5. Slowly release and return to the starting position. Repeat2 times. Complete this exercise 3 times per week. STRETCHING EXERCISES These exercises warm up your muscles and joints and improve the movement and flexibility of your injured elbow and forearm. These exercises also help to relieve pain, numbness, and tingling.These exercises are done using your healthy elbow and forearm to help stretch the muscles in your injured elbow and forearm. Exercise E: Elbow Flexion, Active-Assisted  1. Hold your left / right arm at your side, and bend your elbow as much as you can using your left / right arm muscles. 2. Use your other hand to bend your left /  right elbow farther. To do this, gently push up on your forearm until you feel a gentle stretch on the back of your elbow. 3. Hold this position for 30 seconds. 4. Slowly return to the starting position. Repeat 2 times. Complete this exercise 3 times per week. Exercise F: Elbow Extension, Active-Assisted  1. Hold your left / right arm at your side, and straighten your elbow as much as you can using your left / right arm muscles. 2. Use your other hand to straighten the left / right elbow farther. To do this, gently push down on your forearm  until you feel a gentle stretch on the inside of your elbow. 3. Hold this position for 30 seconds. 4. Slowly return to the starting position. Repeat 2 times. Complete this exercise 3 times per weeky. Exercise G: Forearm Rotation, Supination, Active-Assisted  1. Sit with your left / right elbow bent in an "L" shape (90 degrees) with your forearm resting on a table. 2. Keeping your upper body and shoulder still, rotate your forearm so your left / right palm faces upward. 3. Use your other hand to help rotate your forearm further until you feel a gentle to moderate stretch. 4. Hold this position for 30 seconds. 5. Slowly release the stretch and return to the starting position. Repeat 2 times. Complete this exercise 3 times per week. Exercise H: Forearm Rotation, Pronation, Active-Assisted  1. Sit with your left / right elbow bent in an "L" shape (90 degrees) with your forearm resting on a table. 2. Keeping your upper body and shoulder still, rotate your forearm so your palm faces the tabletop. 3. Use your other hand to help rotate your forearm further until you feel a gentle to moderate stretch. 4. Hold this position for 30 seconds. 5. Slowly release the stretch and return to the starting position. Repeat 2 times. Complete this exercise 3 times per week. Exercise I: Elbow Flexion, Supine, Passive 1. Lie on your back. 2. Extend your left / right arm up in the air, bracing it with your other hand. 3. Let your left / right your hand slowly lower toward your shoulder, while your elbow stays pointed toward the ceiling. You should feel a gentle stretch along the back of your upper arm and elbow. 4. If instructed by your health care provider, you may increase the intensity of your stretch by adding a small wrist weight or hand weight. 5. Hold this position for 3 seconds. 6. Slowly return to the starting position. Repeat 2 times. Complete this exercise 3 times per week. Exercise J: Elbow Extension,  Supine, Passive  1. Lie on your back. Make sure that you are in a comfortable position that lets you relax your arm muscles. 2. Place a folded towel under your left / right upper arm so your elbow and shoulder are at the same height. Straighten your left / right arm so your elbow does not rest on the bed or towel. 3. Let the weight of your hand stretch your elbow. Keep your arm and chest muscles relaxed. You should feel a stretch on the inside of your elbow. 4. If told by your health care provider, you may increase the intensity of your stretch by adding a small wrist weight or hand weight. 5. Hold this position for 30 seconds. 6. Slowly release the stretch. Repeat 2 times. Complete this exercise 3 times per week. STRENGTHENING EXERCISES These exercises build strength and endurance in your elbow and forearm. Endurance is the ability  to use your muscles for a long time, even after they get tired. Exercise K: Elbow Flexion, Isometric  1. Stand or sit up straight. 2. Bend your left / right elbow in an "L" shape (90 degrees) and turn your palm up so your forearm is at the height of your waist. 3. Place your other hand on top of your forearm. Gently push down as your left / right arm resists. Push as hard as you can with both arms without causing any pain or movement at your left / right elbow. 4. Hold this position for 3 seconds. 5. Slowly release the tension in both arms. Let your muscles relax completely before repeating. Repeat 2 times. Complete this exercise 3 times per week. Exercise L: Elbow Extensors, Isometric  1. Stand or sit up straight. 2. Place your left / right arm so your palm faces your abdomen and it is at the height of your waist. 3. Place your other hand on the underside of your forearm. Gently push up as your left / right arm resists. Push as hard as you can with both arms, without causing any pain or movement at your left / right elbow. 4. Hold this position for 3  seconds. 5. Slowly release the tension in both arms. Let your muscles relax completely before repeating. Repeat _______2___ times. Complete this exercise 3 times per week. Exercise M: Elbow Flexion With Forearm Palm Up  1. Sit upright on a firm chair without armrests, or stand. 2. Place your left / right arm at your side with your palm facing forward. 3. Holding a 5 lbweight or gripping a rubber exercise band or tubing, bend your elbow to bring your hand toward your shoulder. 4. Hold this position for 3 seconds. 5. Slowly return to the starting position. Repeat 2 times. Complete this exercise 3 times per week. Exercise N: Elbow Extension  1. Sit on a firm chair without armrests, or stand. 2. Keeping your upper arms at your sides, bring both hands up toward your left / right shoulder while you grip a rubber exercise band or tubing. Your left / right hand should be just below the other hand. 3. Straighten your left / right elbow. 4. Hold this position for 3 seconds. 5. Control the resistance of the band or tubing as your hand returns to your side. Repeat 2 times. Complete this exercise 3 times per week. Exercise O: Forearm Rotation, Supination  1. Sit with your left / right forearm supported on a table. Keep your elbow at waist height. 2. Rest your hand over the edge of the table with your palm facing down. 3. Gently hold a lightweight hammer. 4. Without moving your elbow, slowly rotate your forearm to turn your palm and hand upward to a "thumbs-up" position. 5. Hold this position for 3 seconds. 6. Slowly return to the starting position. Repeat 2 times. Complete this exercise 3 times per week. Exercise P: Forearm Rotation, Pronation  1. Sit with your left / right forearm supported on a table. Keep your elbow below shoulder height. 2. Rest your hand over the edge of the table with your palm facing up. 3. Gently hold a lightweight hammer. 4. Without moving your elbow, slowly rotate your  forearm to turn your palm and hand upward to a "thumbs-up" position. 5. Hold this position for 3 seconds. 6. Slowly return to the starting position. Repeat 2 times. Complete this exercise 3 times per week.  Make sure you discuss any questions you have with  your health care provider. Document Released: 06/27/2005 Document Revised: 12/22/2015 Document Reviewed: 05/08/2015 Elsevier Interactive Patient Education  [employer].

## 2019-12-17 ENCOUNTER — Other Ambulatory Visit: Payer: Self-pay | Admitting: Family Medicine

## 2019-12-17 ENCOUNTER — Telehealth: Payer: Self-pay

## 2019-12-17 LAB — COMPREHENSIVE METABOLIC PANEL
ALT: 26 U/L (ref 0–53)
AST: 17 U/L (ref 0–37)
Albumin: 4.6 g/dL (ref 3.5–5.2)
Alkaline Phosphatase: 58 U/L (ref 39–117)
BUN: 19 mg/dL (ref 6–23)
CO2: 31 mEq/L (ref 19–32)
Calcium: 9.7 mg/dL (ref 8.4–10.5)
Chloride: 101 mEq/L (ref 96–112)
Creatinine, Ser: 0.85 mg/dL (ref 0.40–1.50)
GFR: 93.27 mL/min (ref >60.00)
Glucose, Bld: 137 mg/dL — ABNORMAL HIGH (ref 70–99)
Potassium: 4 mEq/L (ref 3.5–5.1)
Sodium: 140 mEq/L (ref 135–145)
Total Bilirubin: 0.5 mg/dL (ref 0.2–1.2)
Total Protein: 6.8 g/dL (ref 6.0–8.3)

## 2019-12-17 LAB — CBC
HCT: 46.3 % (ref 39.0–52.0)
Hemoglobin: 15.9 g/dL (ref 13.0–17.0)
MCHC: 34.4 g/dL (ref 30.0–36.0)
MCV: 95.9 fl (ref 78.0–100.0)
Platelets: 237 10*3/uL (ref 150.0–400.0)
RBC: 4.83 Mil/uL (ref 4.22–5.81)
RDW: 12.9 % (ref 11.5–15.5)
WBC: 4.9 10*3/uL (ref 4.0–10.5)

## 2019-12-17 LAB — LIPID PANEL
Cholesterol: 184 mg/dL (ref 0–200)
HDL: 34.8 mg/dL — ABNORMAL LOW (ref >39.00)
Total CHOL/HDL Ratio: 5
Triglycerides: 472 mg/dL — ABNORMAL HIGH (ref 0.0–149.0)

## 2019-12-17 LAB — LDL CHOLESTEROL, DIRECT: Direct LDL: 96 mg/dL

## 2019-12-17 LAB — HEMOGLOBIN A1C: Hgb A1c MFr Bld: 6.9 % — ABNORMAL HIGH (ref 4.6–6.5)

## 2019-12-17 LAB — PSA: PSA: 0.7 ng/mL (ref 0.10–4.00)

## 2019-12-17 MED ORDER — ICOSAPENT ETHYL 1 G PO CAPS: 2.0000 g | ORAL_CAPSULE | Freq: Two times a day (BID) | ORAL | 2 refills | Status: DC

## 2019-12-17 NOTE — Telephone Encounter (Signed)
Vascepa not covered- requires previous cardiovascular event such as TIA or MI.

## 2019-12-17 NOTE — Telephone Encounter (Signed)
Plz let pt know. I would like to change his statin to Crestor as this is a bit more potent. Please send in 20 mg tabs, disp 30 w 2 refills if he is OK w/ this. Ty.

## 2019-12-18 ENCOUNTER — Telehealth: Payer: Self-pay | Admitting: Family Medicine

## 2019-12-18 MED ORDER — ROSUVASTATIN CALCIUM 20 MG PO TABS: 20.0000 mg | ORAL_TABLET | Freq: Every day | ORAL | 3 refills | Status: DC

## 2019-12-18 MED FILL — ROSUVASTATIN CALCIUM 20 MG: 20 | 30 days supply | Qty: 30 | Fill #0

## 2019-12-18 NOTE — Telephone Encounter (Signed)
Called informed the patient and sent in the crestor. Updated medication list. The patient verbalized understanding

## 2019-12-18 NOTE — Telephone Encounter (Signed)
Called informed the patient of PCP instructions. He states he is taking already fluvastatin and adding crestor would be 2 cholesterol medications?

## 2019-12-18 NOTE — Addendum Note (Signed)
Addended by: Sharon Seller B on: 12/18/2019 11:47 AM   Modules accepted: Orders

## 2019-12-18 NOTE — Telephone Encounter (Signed)
See previous message regarding this

## 2019-12-18 NOTE — Addendum Note (Signed)
Addended by: Sharon Seller B on: 12/18/2019 09:05 AM   Modules accepted: Orders

## 2019-12-18 NOTE — Telephone Encounter (Signed)
Returning a call

## 2019-12-18 NOTE — Telephone Encounter (Signed)
No, he would stop his current statin. Crestor is a bit more potent.

## 2020-01-18 MED FILL — ROSUVASTATIN CALCIUM 20 MG: 20 | 30 days supply | Qty: 30 | Fill #1

## 2020-01-18 MED FILL — METFORMIN HCL 1000 MG TABS: 1000 | 30 days supply | Qty: 60 | Fill #0

## 2020-01-18 MED FILL — JARDIANCE 25 MG TABLET: 25 | 30 days supply | Qty: 30 | Fill #2

## 2020-02-03 ENCOUNTER — Other Ambulatory Visit: Payer: Self-pay | Admitting: Family Medicine

## 2020-02-18 ENCOUNTER — Other Ambulatory Visit: Payer: Self-pay | Admitting: Family Medicine

## 2020-02-18 MED FILL — ROSUVASTATIN CALCIUM 20 MG: 20 | 30 days supply | Qty: 30 | Fill #2

## 2020-02-18 MED FILL — JARDIANCE 25 MG TABLET: 25 | 30 days supply | Qty: 30 | Fill #0

## 2020-03-09 MED FILL — LOSARTAN POTASSIUM 50 MG TA: 50 | 30 days supply | Qty: 30 | Fill #1

## 2020-03-21 MED FILL — JARDIANCE 25 MG TABLET: 25 | 30 days supply | Qty: 30 | Fill #1

## 2020-03-21 MED FILL — ROSUVASTATIN CALCIUM 20 MG: 20 | 30 days supply | Qty: 30 | Fill #3

## 2020-03-31 MED FILL — METFORMIN HCL 1000 MG TABS: 1000 | 30 days supply | Qty: 60 | Fill #1

## 2020-03-31 MED FILL — LOSARTAN POTASSIUM 50 MG TA: 50 | 30 days supply | Qty: 30 | Fill #2

## 2020-04-20 ENCOUNTER — Other Ambulatory Visit: Payer: Self-pay | Admitting: Family Medicine

## 2020-04-20 MED FILL — ROSUVASTATIN CALCIUM 20 MG: 20 | 30 days supply | Qty: 30 | Fill #0

## 2020-04-20 MED FILL — JARDIANCE 25 MG TABLET: 25 | 30 days supply | Qty: 30 | Fill #2

## 2020-05-10 ENCOUNTER — Encounter: Payer: Self-pay | Admitting: Family Medicine

## 2020-05-10 ENCOUNTER — Other Ambulatory Visit: Payer: Self-pay | Admitting: Family Medicine

## 2020-05-10 ENCOUNTER — Ambulatory Visit: Payer: 59 | Admitting: Family Medicine

## 2020-05-10 ENCOUNTER — Other Ambulatory Visit: Payer: Self-pay

## 2020-05-10 VITALS — BP 122/68 | HR 88 | Temp 98.2°F | Ht 68.0 in | Wt 170.5 lb

## 2020-05-10 DIAGNOSIS — F22 Delusional disorders: Secondary | ICD-10-CM | POA: Diagnosis not present

## 2020-05-10 MED ORDER — ARIPIPRAZOLE 5 MG PO TABS: 5.0000 mg | ORAL_TABLET | Freq: Every day | ORAL | 3 refills | Status: DC

## 2020-05-10 MED FILL — ARIPIPRAZOLE 5 MG TABS: 5 | 30 days supply | Qty: 30 | Fill #0
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
## 2020-05-10 NOTE — Patient Instructions (Addendum)
If you do not hear anything about your referral in the next 1-2 weeks, call our office and ask for an update.  GoodRx is a good option for cost issues that may arise.  Remove deadly weapons from home.  [facility] or [facility] as listed below if you are having a suicidal emergency.   [facility] [address] [place] Mem[place], [place] [address] [phone]  [facility] [address] [place], [place] [address] [phone]  [facility] [place], [place] [address] [phone]  [facility] [address], [place], [place], #[id number] [address], [place], [place], [place]  [facility] [place], [place], [place], [place]  [facility] [place], [place]  Call one of these offices sooner than later as it can take 2-3 months to get a new patient appointment.

## 2020-05-11 ENCOUNTER — Other Ambulatory Visit: Payer: Self-pay | Admitting: Family Medicine

## 2020-05-11 DIAGNOSIS — D582 Other hemoglobinopathies: Secondary | ICD-10-CM

## 2020-05-11 LAB — COMPREHENSIVE METABOLIC PANEL
AG Ratio: 2 (calc) (ref 1.0–2.5)
ALT: 35 U/L (ref 9–46)
AST: 13 U/L (ref 10–35)
Albumin: 4.9 g/dL (ref 3.6–5.1)
Alkaline phosphatase (APISO): 58 U/L (ref 35–144)
BUN: 24 mg/dL (ref 7–25)
CO2: 27 mmol/L (ref 20–32)
Calcium: 9.9 mg/dL (ref 8.6–10.3)
Chloride: 102 mmol/L (ref 98–110)
Creat: 1.24 mg/dL (ref 0.70–1.33)
Globulin: 2.4 g/dL (calc) (ref 1.9–3.7)
Glucose, Bld: 146 mg/dL — ABNORMAL HIGH (ref 65–99)
Potassium: 4.5 mmol/L (ref 3.5–5.3)
Sodium: 141 mmol/L (ref 135–146)
Total Bilirubin: 0.5 mg/dL (ref 0.2–1.2)
Total Protein: 7.3 g/dL (ref 6.1–8.1)

## 2020-05-11 LAB — CBC
HCT: 51.8 % — ABNORMAL HIGH (ref 38.5–50.0)
Hemoglobin: 17.8 g/dL — ABNORMAL HIGH (ref 13.2–17.1)
MCH: 32.5 pg (ref 27.0–33.0)
MCHC: 34.4 g/dL (ref 32.0–36.0)
MCV: 94.7 fL (ref 80.0–100.0)
MPV: 10.3 fL (ref 7.5–12.5)
Platelets: 265 10*3/uL (ref 140–400)
RBC: 5.47 10*6/uL (ref 4.20–5.80)
RDW: 12.3 % (ref 11.0–15.0)
WBC: 6.4 10*3/uL (ref 3.8–10.8)

## 2020-05-11 LAB — TSH: TSH: 1.51 mIU/L (ref 0.40–4.50)

## 2020-05-11 LAB — VITAMIN B12: Vitamin B-12: 468 pg/mL (ref 200–1100)

## 2020-05-11 MED FILL — LOSARTAN POTASSIUM 50 MG TA: 50 | 30 days supply | Qty: 30 | Fill #3

## 2020-05-12 DIAGNOSIS — F22 Delusional disorders: Secondary | ICD-10-CM

## 2020-05-12 DIAGNOSIS — F411 Generalized anxiety disorder: Secondary | ICD-10-CM

## 2020-05-24 ENCOUNTER — Other Ambulatory Visit: Payer: Self-pay | Admitting: Family Medicine

## 2020-05-24 MED FILL — ROSUVASTATIN CALCIUM 20 MG: 20 | 30 days supply | Qty: 30 | Fill #1

## 2020-05-24 MED FILL — JARDIANCE 25 MG TABLET: 25 | 30 days supply | Qty: 30 | Fill #0

## 2020-05-25 ENCOUNTER — Other Ambulatory Visit: Payer: Self-pay

## 2020-05-25 ENCOUNTER — Other Ambulatory Visit (INDEPENDENT_AMBULATORY_CARE_PROVIDER_SITE_OTHER): Payer: 59

## 2020-05-25 DIAGNOSIS — D582 Other hemoglobinopathies: Secondary | ICD-10-CM | POA: Diagnosis not present

## 2020-05-26 LAB — CBC WITH DIFFERENTIAL/PLATELET
Absolute Monocytes: 588 cells/uL (ref 200–950)
Basophils Absolute: 42 cells/uL (ref 0–200)
Basophils Relative: 0.8 %
Eosinophils Absolute: 88 cells/uL (ref 15–500)
Eosinophils Relative: 1.7 %
HCT: 48 % (ref 38.5–50.0)
Hemoglobin: 16.7 g/dL (ref 13.2–17.1)
Lymphs Abs: 1222 cells/uL (ref 850–3900)
MCH: 32.7 pg (ref 27.0–33.0)
MCHC: 34.8 g/dL (ref 32.0–36.0)
MCV: 93.9 fL (ref 80.0–100.0)
MPV: 10.3 fL (ref 7.5–12.5)
Monocytes Relative: 11.3 %
Neutro Abs: 3260 cells/uL (ref 1500–7800)
Neutrophils Relative %: 62.7 %
Platelets: 239 10*3/uL (ref 140–400)
RBC: 5.11 10*6/uL (ref 4.20–5.80)
RDW: 12.2 % (ref 11.0–15.0)
Total Lymphocyte: 23.5 %
WBC: 5.2 10*3/uL (ref 3.8–10.8)

## 2020-05-26 LAB — ERYTHROPOIETIN: Erythropoietin: 8.4 m[IU]/mL (ref 2.6–18.5)

## 2020-05-27 DIAGNOSIS — F330 Major depressive disorder, recurrent, mild: Secondary | ICD-10-CM | POA: Insufficient documentation

## 2020-06-01 ENCOUNTER — Other Ambulatory Visit: Payer: Self-pay | Admitting: Family Medicine

## 2020-06-01 DIAGNOSIS — F22 Delusional disorders: Secondary | ICD-10-CM

## 2020-06-01 DIAGNOSIS — F411 Generalized anxiety disorder: Secondary | ICD-10-CM

## 2020-06-07 MED FILL — ARIPIPRAZOLE 5 MG TABS: 5 | 30 days supply | Qty: 30 | Fill #1

## 2020-06-09 ENCOUNTER — Other Ambulatory Visit: Payer: Self-pay | Admitting: Family Medicine

## 2020-06-09 MED FILL — METFORMIN HCL 1000 MG TABS: 1000 | 30 days supply | Qty: 60 | Fill #2

## 2020-06-09 MED FILL — LOSARTAN POTASSIUM 50 MG TA: 50 | 30 days supply | Qty: 30 | Fill #0

## 2020-06-16 MED FILL — JARDIANCE 25 MG TABLET: 25 | 30 days supply | Qty: 30 | Fill #1

## 2020-06-23 MED FILL — ROSUVASTATIN CALCIUM 20 MG: 20 | 30 days supply | Qty: 30 | Fill #2

## 2020-07-06 MED FILL — ARIPIPRAZOLE 5 MG TABS: 5 | 30 days supply | Qty: 30 | Fill #2

## 2020-07-06 MED FILL — LOSARTAN POTASSIUM 50 MG TA: 50 | 30 days supply | Qty: 30 | Fill #1

## 2020-07-11 ENCOUNTER — Ambulatory Visit: Payer: 59 | Admitting: Family Medicine

## 2020-07-11 ENCOUNTER — Other Ambulatory Visit: Payer: Self-pay | Admitting: Family Medicine

## 2020-07-11 DIAGNOSIS — E119 Type 2 diabetes mellitus without complications: Secondary | ICD-10-CM

## 2020-07-11 MED FILL — ONE TOUCH ULTRA TEST STRIPS: 25 days supply | Qty: 25 | Fill #0

## 2020-07-28 MED FILL — METFORMIN HCL 1000 MG TABS: 1000 | 30 days supply | Qty: 60 | Fill #3

## 2020-07-28 MED FILL — ROSUVASTATIN CALCIUM 20 MG: 20 | 30 days supply | Qty: 30 | Fill #3

## 2020-08-01 MED FILL — ARIPIPRAZOLE 5 MG TABS: 5 | 30 days supply | Qty: 30 | Fill #3

## 2020-08-03 MED FILL — LOSARTAN POTASSIUM 50 MG TA: 50 | 30 days supply | Qty: 30 | Fill #2

## 2020-08-03 MED FILL — JARDIANCE 25 MG TABLET: 25 | 30 days supply | Qty: 30 | Fill #2

## 2020-08-31 ENCOUNTER — Other Ambulatory Visit: Payer: Self-pay | Admitting: Family Medicine

## 2020-08-31 DIAGNOSIS — F22 Delusional disorders: Secondary | ICD-10-CM

## 2020-08-31 MED FILL — LOSARTAN POTASSIUM 50 MG TA: 50 | 30 days supply | Qty: 30 | Fill #3

## 2020-08-31 MED FILL — JARDIANCE 25 MG TABLET: 25 | 30 days supply | Qty: 30 | Fill #0

## 2020-08-31 MED FILL — ARIPIPRAZOLE 5 MG TABS: 5 | 30 days supply | Qty: 30 | Fill #0

## 2020-08-31 MED FILL — ROSUVASTATIN CALCIUM 20 MG: 20 | 30 days supply | Qty: 30 | Fill #0

## 2020-10-06 ENCOUNTER — Other Ambulatory Visit: Payer: Self-pay | Admitting: Family Medicine

## 2020-10-06 MED FILL — LOSARTAN POTASSIUM 50 MG TA: 50 | 30 days supply | Qty: 30 | Fill #0

## 2020-10-06 MED FILL — ROSUVASTATIN CALCIUM 20 MG: 20 | 30 days supply | Qty: 30 | Fill #1

## 2020-10-06 MED FILL — JARDIANCE 25 MG TABLET: 25 | 30 days supply | Qty: 30 | Fill #1

## 2020-10-06 MED FILL — METFORMIN HCL 1000 MG TABS: 1000 | 30 days supply | Qty: 60 | Fill #4

## 2020-10-19 MED FILL — ARIPIPRAZOLE 5 MG TABS: 5 | 30 days supply | Qty: 30 | Fill #1

## 2020-11-01 ENCOUNTER — Other Ambulatory Visit (HOSPITAL_BASED_OUTPATIENT_CLINIC_OR_DEPARTMENT_OTHER): Payer: Self-pay | Admitting: Nurse Practitioner

## 2020-11-07 ENCOUNTER — Other Ambulatory Visit: Payer: Self-pay | Admitting: Family Medicine

## 2020-11-28 ENCOUNTER — Other Ambulatory Visit (HOSPITAL_BASED_OUTPATIENT_CLINIC_OR_DEPARTMENT_OTHER): Payer: Self-pay

## 2020-11-28 MED FILL — Aripiprazole Tab 5 MG: ORAL | 30 days supply | Qty: 30 | Fill #0 | Status: AC

## 2020-12-11 ENCOUNTER — Other Ambulatory Visit: Payer: Self-pay | Admitting: Family Medicine

## 2020-12-11 MED FILL — Rosuvastatin Calcium Tab 20 MG: ORAL | 30 days supply | Qty: 30 | Fill #0 | Status: AC

## 2020-12-11 MED FILL — Metformin HCl Tab 1000 MG: ORAL | 30 days supply | Qty: 60 | Fill #0 | Status: AC

## 2020-12-12 ENCOUNTER — Other Ambulatory Visit (HOSPITAL_BASED_OUTPATIENT_CLINIC_OR_DEPARTMENT_OTHER): Payer: Self-pay

## 2020-12-12 MED ORDER — EMPAGLIFLOZIN 25 MG PO TABS
ORAL_TABLET | Freq: Every day | ORAL | 2 refills | Status: DC
  Filled 2020-12-12: qty 30, 30d supply, fill #0
  Filled 2021-01-09: qty 30, 30d supply, fill #1
  Filled 2021-02-10: qty 30, 30d supply, fill #2

## 2020-12-12 MED ORDER — LOSARTAN POTASSIUM 50 MG PO TABS
ORAL_TABLET | Freq: Every day | ORAL | 0 refills | Status: DC
  Filled 2020-12-12: qty 30, 30d supply, fill #0

## 2021-01-09 ENCOUNTER — Other Ambulatory Visit: Payer: Self-pay | Admitting: Family Medicine

## 2021-01-10 ENCOUNTER — Other Ambulatory Visit (HOSPITAL_BASED_OUTPATIENT_CLINIC_OR_DEPARTMENT_OTHER): Payer: Self-pay

## 2021-01-10 MED ORDER — ROSUVASTATIN CALCIUM 20 MG PO TABS
20.0000 mg | ORAL_TABLET | Freq: Every day | ORAL | 0 refills | Status: DC
  Filled 2021-01-10: qty 30, 30d supply, fill #0

## 2021-01-10 MED ORDER — LOSARTAN POTASSIUM 50 MG PO TABS
50.0000 mg | ORAL_TABLET | Freq: Every day | ORAL | 0 refills | Status: DC
  Filled 2021-01-10: qty 30, 30d supply, fill #0

## 2021-01-22 MED FILL — Aripiprazole Tab 5 MG: ORAL | 30 days supply | Qty: 30 | Fill #1 | Status: AC

## 2021-01-24 ENCOUNTER — Other Ambulatory Visit (HOSPITAL_BASED_OUTPATIENT_CLINIC_OR_DEPARTMENT_OTHER): Payer: Self-pay

## 2021-02-07 ENCOUNTER — Other Ambulatory Visit (HOSPITAL_BASED_OUTPATIENT_CLINIC_OR_DEPARTMENT_OTHER): Payer: Self-pay

## 2021-02-07 MED ORDER — ARIPIPRAZOLE 5 MG PO TABS
5.0000 mg | ORAL_TABLET | Freq: Every day | ORAL | 0 refills | Status: DC
  Filled 2021-02-07: qty 90, 90d supply, fill #0
  Filled 2021-02-14: qty 30, 30d supply, fill #0

## 2021-02-10 ENCOUNTER — Other Ambulatory Visit: Payer: Self-pay

## 2021-02-10 ENCOUNTER — Other Ambulatory Visit: Payer: Self-pay | Admitting: Family Medicine

## 2021-02-10 ENCOUNTER — Ambulatory Visit: Payer: 59 | Admitting: Family Medicine

## 2021-02-10 ENCOUNTER — Encounter: Payer: Self-pay | Admitting: Family Medicine

## 2021-02-10 ENCOUNTER — Other Ambulatory Visit (HOSPITAL_BASED_OUTPATIENT_CLINIC_OR_DEPARTMENT_OTHER): Payer: Self-pay

## 2021-02-10 VITALS — BP 120/64 | HR 81 | Temp 98.5°F | Ht 68.0 in | Wt 174.4 lb

## 2021-02-10 DIAGNOSIS — M79672 Pain in left foot: Secondary | ICD-10-CM

## 2021-02-10 MED ORDER — ROSUVASTATIN CALCIUM 20 MG PO TABS
20.0000 mg | ORAL_TABLET | Freq: Every day | ORAL | 0 refills | Status: DC
  Filled 2021-02-10: qty 30, 30d supply, fill #0

## 2021-02-10 MED ORDER — PREDNISONE 20 MG PO TABS
40.0000 mg | ORAL_TABLET | Freq: Every day | ORAL | 0 refills | Status: AC
  Filled 2021-02-10: qty 10, 5d supply, fill #0

## 2021-02-10 MED ORDER — LOSARTAN POTASSIUM 50 MG PO TABS
50.0000 mg | ORAL_TABLET | Freq: Every day | ORAL | 0 refills | Status: DC
  Filled 2021-02-10: qty 30, 30d supply, fill #0

## 2021-02-10 MED ORDER — METFORMIN HCL 1000 MG PO TABS
ORAL_TABLET | Freq: Two times a day (BID) | ORAL | 3 refills | Status: DC
  Filled 2021-02-10: qty 60, 30d supply, fill #0

## 2021-02-10 NOTE — Progress Notes (Signed)
Musculoskeletal Exam  Patient: Shawn Perez DOB: Nov 02, 1965  DOS: 02/10/2021  SUBJECTIVE:  Chief Complaint:   Chief Complaint  Patient presents with   Foot Swelling    Left foot swelling and pain    Shawn Perez is a 55 y.o.  male for evaluation and treatment of L foot pain.   Onset:  5 days ago. No inj or change in activity.  Location: Top of foot Character:  aching and sharp  Progression of issue:  is unchanged Associated symptoms: Swelling, redness, difficulty walking; no bruising Treatment: to date has been rest and ice.   Neurovascular symptoms: no  Past Medical History:  Diagnosis Date   Anxiety    Diabetes mellitus without complication ([place])    type 2   Family history of adverse reaction to anesthesia    [name] has trouble waking up both times under anesthesia diagnosed with sleep apnea   Heart murmur    since birth   Hyperlipidemia 2011   Hypertension    Sleep apnea    wears cpap   Temporomandibular jaw dysfunction     Objective: VITAL SIGNS: BP 120/64   Pulse 81   Temp 98.5 F (36.9 C) (Oral)   Ht 5\' 8"  (1.727 m)   Wt 174 lb 6 oz (79.1 kg)   SpO2 96%   BMI 26.51 kg/m  Constitutional: Well formed, well developed. No acute distress. Thorax & Lungs: No accessory muscle use Musculoskeletal: Left foot.   Tenderness to palpation: Yes over the medial dorsum of the foot Warmth also appreciated without fluctuance or drainage Edema noted on the dorsum of the foot Ecchymosis: no Neurologic: Normal sensory function.  Antalgic gait Psychiatric: Normal mood. Age appropriate judgment and insight. Alert & oriented x 3.    Assessment:  Left foot pain - Plan: Uric acid  Plan New diagnosis, uncertain prognosis.  I think he might have gout.  Check uric acid.  Prednisone burst 5 days, 40 mg daily.  Ice.  Tylenol.  Foods to avoid and foods to migrate towards provided should he have gout.   F/u pending above. The patient voiced understanding and agreement  to the plan.   [place], DO 02/10/21  4:58 PM

## 2021-02-10 NOTE — Patient Instructions (Signed)
Give [place] 2-3 business days to get the results of your labs back.   Ice/cold pack over area for 10-15 min twice daily.  OK to take Tylenol 1000 mg (2 extra strength tabs) or 975 mg (3 regular strength tabs) every 6 hours as needed.  If this is suggestive of gout: Foods to AVOID: Red meat, organ meat (liver), lunch meat, seafood (mussels, scallops, anchovies, etc) Alcohol Sugary foods/beverages (diet soft drinks have no link to flares)  Foods to migrate to: Dairy Vegetables Cherries have limited data to suggest they help lower uric acid levels (and prevent flares) Vit C (500 mg daily) may have a modest effect with preventing flares Poultry If you are going to eat red meat, beef and pork may give you less problems than lamb.  Let us know if you need anything.

## 2021-02-13 ENCOUNTER — Other Ambulatory Visit (INDEPENDENT_AMBULATORY_CARE_PROVIDER_SITE_OTHER): Payer: 59

## 2021-02-13 ENCOUNTER — Other Ambulatory Visit: Payer: Self-pay

## 2021-02-13 DIAGNOSIS — M79672 Pain in left foot: Secondary | ICD-10-CM

## 2021-02-13 NOTE — Addendum Note (Signed)
Addended by: Kelle Darting A on: 02/13/2021 01:49 PM   Modules accepted: Orders

## 2021-02-14 ENCOUNTER — Other Ambulatory Visit (HOSPITAL_BASED_OUTPATIENT_CLINIC_OR_DEPARTMENT_OTHER): Payer: Self-pay

## 2021-02-14 LAB — URIC ACID: Uric Acid, Serum: 6.2 mg/dL (ref 4.0–7.8)

## 2021-02-17 ENCOUNTER — Other Ambulatory Visit (HOSPITAL_BASED_OUTPATIENT_CLINIC_OR_DEPARTMENT_OTHER): Payer: Self-pay

## 2021-03-05 ENCOUNTER — Other Ambulatory Visit: Payer: Self-pay | Admitting: Family Medicine

## 2021-03-05 DIAGNOSIS — F22 Delusional disorders: Secondary | ICD-10-CM

## 2021-03-06 ENCOUNTER — Other Ambulatory Visit (HOSPITAL_BASED_OUTPATIENT_CLINIC_OR_DEPARTMENT_OTHER): Payer: Self-pay

## 2021-03-06 ENCOUNTER — Other Ambulatory Visit: Payer: Self-pay | Admitting: Family Medicine

## 2021-03-06 MED ORDER — ROSUVASTATIN CALCIUM 20 MG PO TABS
20.0000 mg | ORAL_TABLET | Freq: Every day | ORAL | 0 refills | Status: DC
  Filled 2021-03-06: qty 30, 30d supply, fill #0

## 2021-03-06 MED ORDER — ARIPIPRAZOLE 5 MG PO TABS
ORAL_TABLET | Freq: Every day | ORAL | 0 refills | Status: DC
Start: 2021-03-06 — End: 2021-04-19
  Filled 2021-03-06: qty 30, 30d supply, fill #0

## 2021-03-06 MED ORDER — EMPAGLIFLOZIN 25 MG PO TABS
ORAL_TABLET | Freq: Every day | ORAL | 2 refills | Status: DC
  Filled 2021-03-06: qty 30, 30d supply, fill #0

## 2021-03-06 NOTE — Telephone Encounter (Signed)
I don't mind filling but if someone else, like psych, took over, they should be the one to fill. Ty.

## 2021-03-06 NOTE — Telephone Encounter (Signed)
Patient requested to do 30 days for today as he is leaving for vacation . He will contact his psy.  For further refills though. Verbalized understanding.

## 2021-03-06 NOTE — Telephone Encounter (Signed)
You last prescribed this in January, looks like other prescriber since then.

## 2021-03-27 ENCOUNTER — Other Ambulatory Visit: Payer: Self-pay | Admitting: Family Medicine

## 2021-03-27 ENCOUNTER — Other Ambulatory Visit (HOSPITAL_BASED_OUTPATIENT_CLINIC_OR_DEPARTMENT_OTHER): Payer: Self-pay

## 2021-03-27 MED ORDER — LOSARTAN POTASSIUM 50 MG PO TABS
50.0000 mg | ORAL_TABLET | Freq: Every day | ORAL | 0 refills | Status: DC
  Filled 2021-03-27: qty 30, 30d supply, fill #0

## 2021-04-12 ENCOUNTER — Ambulatory Visit (INDEPENDENT_AMBULATORY_CARE_PROVIDER_SITE_OTHER): Payer: 59 | Admitting: Family Medicine

## 2021-04-12 ENCOUNTER — Encounter: Payer: Self-pay | Admitting: Family Medicine

## 2021-04-12 ENCOUNTER — Other Ambulatory Visit (HOSPITAL_BASED_OUTPATIENT_CLINIC_OR_DEPARTMENT_OTHER): Payer: Self-pay

## 2021-04-12 ENCOUNTER — Ambulatory Visit (HOSPITAL_BASED_OUTPATIENT_CLINIC_OR_DEPARTMENT_OTHER)
Admission: RE | Admit: 2021-04-12 | Discharge: 2021-04-12 | Disposition: A | Discharge status: Home / Self Care | Payer: 59 | Source: Ambulatory Visit | Attending: Family Medicine | Admitting: Family Medicine

## 2021-04-12 ENCOUNTER — Other Ambulatory Visit: Payer: Self-pay

## 2021-04-12 VITALS — BP 110/62 | HR 57 | Temp 97.9°F | Ht 68.0 in | Wt 178.0 lb

## 2021-04-12 DIAGNOSIS — Z125 Encounter for screening for malignant neoplasm of prostate: Secondary | ICD-10-CM

## 2021-04-12 DIAGNOSIS — E1165 Type 2 diabetes mellitus with hyperglycemia: Secondary | ICD-10-CM | POA: Diagnosis not present

## 2021-04-12 DIAGNOSIS — L570 Actinic keratosis: Secondary | ICD-10-CM | POA: Diagnosis not present

## 2021-04-12 DIAGNOSIS — R062 Wheezing: Secondary | ICD-10-CM | POA: Diagnosis present

## 2021-04-12 DIAGNOSIS — Z0000 Encounter for general adult medical examination without abnormal findings: Secondary | ICD-10-CM | POA: Diagnosis not present

## 2021-04-12 LAB — LIPID PANEL
Cholesterol: 132 mg/dL (ref 0–200)
HDL: 37.7 mg/dL — ABNORMAL LOW (ref >39.00)
NonHDL: 94.43
Total CHOL/HDL Ratio: 4
Triglycerides: 299 mg/dL — ABNORMAL HIGH (ref 0.0–149.0)
VLDL: 59.8 mg/dL — ABNORMAL HIGH (ref 0.0–40.0)

## 2021-04-12 LAB — LDL CHOLESTEROL, DIRECT: Direct LDL: 58 mg/dL

## 2021-04-12 LAB — COMPREHENSIVE METABOLIC PANEL
ALT: 29 U/L (ref 0–53)
AST: 19 U/L (ref 0–37)
Albumin: 4.5 g/dL (ref 3.5–5.2)
Alkaline Phosphatase: 63 U/L (ref 39–117)
BUN: 21 mg/dL (ref 6–23)
CO2: 29 mEq/L (ref 19–32)
Calcium: 9.8 mg/dL (ref 8.4–10.5)
Chloride: 100 mEq/L (ref 96–112)
Creatinine, Ser: 0.9 mg/dL (ref 0.40–1.50)
GFR: 94.99 mL/min (ref >60.00)
Glucose, Bld: 128 mg/dL — ABNORMAL HIGH (ref 70–99)
Potassium: 4.6 mEq/L (ref 3.5–5.1)
Sodium: 139 mEq/L (ref 135–145)
Total Bilirubin: 0.9 mg/dL (ref 0.2–1.2)
Total Protein: 7 g/dL (ref 6.0–8.3)

## 2021-04-12 LAB — HEMOGLOBIN A1C: Hgb A1c MFr Bld: 7.4 % — ABNORMAL HIGH (ref 4.6–6.5)

## 2021-04-12 LAB — PSA: PSA: 0.78 ng/mL (ref 0.10–4.00)

## 2021-04-12 LAB — MICROALBUMIN / CREATININE URINE RATIO
Creatinine,U: 52.2 mg/dL
Microalb Creat Ratio: 1.3 mg/g (ref 0.0–30.0)
Microalb, Ur: <0.7 mg/dL (ref 0.0–1.9)

## 2021-04-12 LAB — CBC
HCT: 46.5 % (ref 39.0–52.0)
Hemoglobin: 15.9 g/dL (ref 13.0–17.0)
MCHC: 34.1 g/dL (ref 30.0–36.0)
MCV: 95 fl (ref 78.0–100.0)
Platelets: 218 10*3/uL (ref 150.0–400.0)
RBC: 4.9 Mil/uL (ref 4.22–5.81)
RDW: 13.2 % (ref 11.5–15.5)
WBC: 5.1 10*3/uL (ref 4.0–10.5)

## 2021-04-12 MED ORDER — METFORMIN HCL 1000 MG PO TABS
ORAL_TABLET | Freq: Two times a day (BID) | ORAL | 3 refills | Status: DC
  Filled 2021-04-12: qty 60, 30d supply, fill #0
  Filled 2021-05-17: qty 60, 30d supply, fill #1
  Filled 2021-06-14: qty 60, 30d supply, fill #2
  Filled 2021-07-16: qty 60, 30d supply, fill #3
  Filled 2021-08-14: qty 60, 30d supply, fill #4
  Filled 2021-10-11: qty 60, 30d supply, fill #5
  Filled 2021-11-17: qty 60, 30d supply, fill #6
  Filled 2021-12-25: qty 60, 30d supply, fill #7
  Filled 2022-01-28: qty 60, 30d supply, fill #8
  Filled 2022-02-25: qty 60, 30d supply, fill #9

## 2021-04-12 MED ORDER — LOSARTAN POTASSIUM 50 MG PO TABS
50.0000 mg | ORAL_TABLET | Freq: Every day | ORAL | 2 refills | Status: DC
  Filled 2021-04-12: qty 30, 30d supply, fill #0
  Filled 2021-04-19: qty 31, 31d supply, fill #0
  Filled 2021-05-17: qty 31, 31d supply, fill #1
  Filled 2021-06-14: qty 31, 31d supply, fill #2
  Filled 2021-07-16: qty 31, 31d supply, fill #3
  Filled 2021-08-14: qty 31, 31d supply, fill #4
  Filled 2021-09-21: qty 31, 31d supply, fill #5
  Filled 2021-10-21: qty 31, 31d supply, fill #6
  Filled 2021-11-17: qty 31, 31d supply, fill #7
  Filled 2021-12-25: qty 31, 31d supply, fill #8

## 2021-04-12 MED ORDER — ROSUVASTATIN CALCIUM 20 MG PO TABS
20.0000 mg | ORAL_TABLET | Freq: Every day | ORAL | 2 refills | Status: DC
  Filled 2021-04-12: qty 30, 30d supply, fill #0
  Filled 2021-05-17: qty 30, 30d supply, fill #1
  Filled 2021-06-14: qty 30, 30d supply, fill #2
  Filled 2021-07-16: qty 30, 30d supply, fill #3
  Filled 2021-08-14: qty 30, 30d supply, fill #4
  Filled 2021-09-21: qty 30, 30d supply, fill #5
  Filled 2021-10-21: qty 30, 30d supply, fill #6
  Filled 2021-11-17: qty 30, 30d supply, fill #7
  Filled 2021-12-25: qty 30, 30d supply, fill #8

## 2021-04-12 MED ORDER — EMPAGLIFLOZIN 25 MG PO TABS
25.0000 mg | ORAL_TABLET | Freq: Every day | ORAL | 2 refills | Status: DC
  Filled 2021-04-12: qty 30, 30d supply, fill #0
  Filled 2021-05-17: qty 30, 30d supply, fill #1
  Filled 2021-06-14: qty 30, 30d supply, fill #2
  Filled 2021-07-16: qty 30, 30d supply, fill #3
  Filled 2021-08-14: qty 30, 30d supply, fill #4
  Filled 2021-09-21: qty 30, 30d supply, fill #5
  Filled 2021-10-21: qty 30, 30d supply, fill #6
  Filled 2021-11-17: qty 30, 30d supply, fill #7
  Filled 2021-12-25: qty 30, 30d supply, fill #8

## 2021-04-12 NOTE — Progress Notes (Signed)
Chief Complaint  Patient presents with   Annual Exam    Well Male Shawn Perez is here for a complete physical.   His last physical was >1 year ago.  Current diet: in general, a "healthy" diet.  Current exercise: none Weight trend: stable Fatigue out of ordinary? No. Seat belt? Yes.    Health maintenance Shingrix- No Colonoscopy- Yes Tetanus- Yes HIV- Yes Hep C- Yes  Skin lesions The patient does not routinely wear sunscreen and does not do skin checks.  He has not noticed any abnormal skin lesions other than skin tags under his armpits.  He uses scent free deodorant.  Wheezing The patient will wheeze intermittently.  He is not having any shortness of breath, unexplained weight change, coughing, swelling, chest pain/tightness or upper respiratory symptoms.  He does have an albuterol inhaler but has not been using it.  He had a device for sleep apnea that was recalled and is concerned it could cancer.    Past Medical History:  Diagnosis Date   Anxiety    Diabetes mellitus without complication ([place])    type 2   Family history of adverse reaction to anesthesia    [name] has trouble waking up both times under anesthesia diagnosed with sleep apnea   Heart murmur    since birth   Hyperlipidemia 2011   Hypertension    Sleep apnea    wears cpap   Temporomandibular jaw dysfunction      Past Surgical History:  Procedure Laterality Date   COLONOSCOPY     INGUINAL HERNIA REPAIR N/A 04/09/2018   Procedure: LAPAROSCOPIC RIGHT INGUINAL AND BILATERAL FEMORAL HERNIA REPAIR;  Surgeon: Michael Boston, MD;  Location: [facility];  Service: General;  Laterality: N/A;   INSERTION OF MESH N/A 04/09/2018   Procedure: INSERTION OF MESH;  Surgeon: Michael Boston, MD;  Location: [facility];  Service: General;  Laterality: N/A;   NO PAST SURGERIES  07/09/2011   Denies surgical history   WISDOM TOOTH EXTRACTION      Medications  Current Outpatient Medications on File Prior to Visit  Medication  Sig Dispense Refill   albuterol (VENTOLIN HFA) 108 (90 Base) MCG/ACT inhaler Inhale 2 puffs into the lungs every 6 (six) hours as needed for wheezing or shortness of breath. 6.7 g 0   ARIPiprazole (ABILIFY) 5 MG tablet Take 1 tablet by mouth daily. 30 tablet 0   Blood Glucose Monitoring Suppl (ONE TOUCH ULTRA SYSTEM KIT) W/DEVICE KIT 1 kit by Does not apply route once. 1 each 0   glucose blood test strip USE AS DIRECTED TO TEST BLOOD SUGAR 1 TIME DAILY 25 strip 1   Lancets (ONETOUCH ULTRASOFT) lancets Use as instructed 100 each 12   latanoprost (XALATAN) 0.005 % ophthalmic solution Place 1 drop into both eyes at bedtime.      tamsulosin (FLOMAX) 0.4 MG CAPS capsule Take 1 capsule (0.4 mg total) by mouth daily after supper. 30 capsule 5   vitamin B-12 (CYANOCOBALAMIN) 1000 MCG tablet TAKE 1 TABLET BY MOUTH DAILY FOR SEVEN DAYS, THEN 1 TABLET WEEKLY THERAFTER 130 tablet 0    Allergies Allergies  Allergen Reactions   Fenofibrate Rash   Penicillins Rash    Childhood allergy Has patient had a PCN reaction causing immediate rash, facial/tongue/throat swelling, SOB or lightheadedness with hypotension: Yes Has patient had a PCN reaction causing severe rash involving mucus membranes or skin necrosis: No Has patient had a PCN reaction that required hospitalization: No Has patient had a  PCN reaction occurring within the last 10 years: No If all of the above answers are "NO", then may proceed with Cephalosporin use.     Family History Family History  Problem Relation Age of Onset   Hypertension Mother    Diabetes Mother    Early death Mother    Heart disease Mother    Heart disease Sister    Early death Brother    Heart disease Brother    Hyperlipidemia Brother    Hypertension Brother    Early death Maternal Aunt    Heart disease Maternal Aunt    Early death Maternal Uncle    Heart disease Maternal Uncle    Heart disease Maternal Grandmother    Early death Maternal Grandmother    Heart  disease Brother     Review of Systems: Constitutional:  no fevers Eye:  no recent significant change in vision Ear/Nose/Mouth/Throat:  Ears:  no hearing loss Nose/Mouth/Throat:  no complaints of nasal congestion, no sore throat Cardiovascular:  no chest pain Respiratory:  no shortness of breath Gastrointestinal:  no change in bowel habits GU:  Male: negative for dysuria, frequency Musculoskeletal/Extremities:  no joint pain Integumentary (Skin/Breast):  no abnormal skin lesions reported Neurologic:  no headaches Endocrine: No unexpected weight changes Hematologic/Lymphatic:  no abnormal bleeding  Exam BP 110/62   Pulse (!) 57   Temp 97.9 F (36.6 C) (Oral)   Ht _0  (1.727 m)   Wt 178 lb (80.7 kg)   SpO2 98%   BMI 27.06 kg/m  General:  well developed, well nourished, in no apparent distress Skin: scaly patches x 2 on L portion of scalp measuring 0.8 cm in diameter and 0.4 cm in diameter; otherwise no significant moles, warts, or growths Head:  no masses, lesions, or tenderness Eyes:  pupils equal and round, sclera anicteric without injection Ears:  canals without lesions, TMs shiny without retraction, no obvious effusion, no erythema Nose:  nares patent, septum midline, mucosa normal Throat/Pharynx:  lips and gingiva without lesion; tongue and uvula midline; non-inflamed pharynx; no exudates or postnasal drainage Neck: neck supple without adenopathy, thyromegaly, or masses Cardiac: RRR, no bruits, no LE edema Lungs:  clear to auscultation, breath sounds equal bilaterally, no respiratory distress Abdomen: BS+, soft, non-tender, non-distended, no masses or organomegaly noted Rectal: Deferred Musculoskeletal:  symmetrical muscle groups noted without atrophy or deformity Neuro:  gait normal; deep tendon reflexes normal and symmetric; sensation intact to pinprick b/l feet.  Psych: well oriented with normal range of affect and appropriate judgment/insight  Procedure note:  cryotherapy Verbal consent obtained 2 skin lesions treated Liquid nitrogen was applied via a thin spray creating an ice ball with 1-2 mm corona surrounding the lesion The patient tolerated the procedure well There were no immediate complications noted  Assessment and Plan  Well adult exam  Type 2 diabetes mellitus with hyperglycemia, without long-term current use of insulin ([place]) - Plan: CBC, Comprehensive metabolic panel, Lipid panel, Hemoglobin A1c, Microalbumin / creatinine urine ratio, Ambulatory referral to Ophthalmology  Screening for prostate cancer - Plan: PSA  Wheezing - Plan: DG Chest 2 View  Actinic keratoses - Plan: PR DESTRUC PREMAL,FIRST LESION, PR DESTRUC BENIGN/PREMAL,2-14 LESIONS   Well 55 y.o. male. Counseled on diet and exercise. Counseled on risks and benefits of prostate cancer screening with PSA. The patient agrees to undergo testing. Wheezing: Ck CXR. Not displaying concerning s/s's for lung cancer. AK's: Cryo today. Wear sun screen.  Shingrix rec'd. Flu shot rec'd in Oct.  Re-refer to ophtho.  Immunizations, labs, and further orders as above. Follow up in 3-6 mo pending the above. The patient voiced understanding and agreement to the plan.  [place], DO 04/12/21 12:04 PM

## 2021-04-12 NOTE — Patient Instructions (Addendum)
Give [place] 2-3 business days to get the results of your labs back.   Keep the diet clean and stay active.  I recommend getting the flu shot in mid October. This suggestion would change if the CDC comes out with a different recommendation.   The new Shingrix vaccine (for shingles) is a 2 shot series. It can make people feel low energy, achy and almost like they have the flu for 48 hours after injection. Please plan accordingly when deciding on when to get this shot. Call our office for a nurse visit appointment to get this. The second shot of the series is less severe regarding the side effects, but it still lasts 48 hours.   If you do not hear anything about your referral in the next 1-2 weeks, call our office and ask for an update.  Let us know if you need anything.

## 2021-04-13 ENCOUNTER — Other Ambulatory Visit: Payer: Self-pay | Admitting: Family Medicine

## 2021-04-13 DIAGNOSIS — R911 Solitary pulmonary nodule: Secondary | ICD-10-CM

## 2021-04-14 ENCOUNTER — Ambulatory Visit (INDEPENDENT_AMBULATORY_CARE_PROVIDER_SITE_OTHER): Payer: 59

## 2021-04-14 ENCOUNTER — Other Ambulatory Visit: Payer: Self-pay

## 2021-04-14 DIAGNOSIS — R911 Solitary pulmonary nodule: Secondary | ICD-10-CM

## 2021-04-14 MED ORDER — IOHEXOL 300 MG/ML  SOLN
100.0000 mL | Freq: Once | INTRAMUSCULAR | Status: AC | PRN
  Administered 2021-04-14: 80 mL via INTRAVENOUS

## 2021-04-17 ENCOUNTER — Ambulatory Visit (HOSPITAL_BASED_OUTPATIENT_CLINIC_OR_DEPARTMENT_OTHER): Payer: 59

## 2021-04-17 ENCOUNTER — Other Ambulatory Visit: Payer: Self-pay | Admitting: Family Medicine

## 2021-04-17 MED ORDER — ASPIRIN 81 MG PO TBEC: 81.0000 mg | DELAYED_RELEASE_TABLET | Freq: Every day | ORAL | 12 refills | Status: DC

## 2021-04-18 ENCOUNTER — Other Ambulatory Visit (HOSPITAL_BASED_OUTPATIENT_CLINIC_OR_DEPARTMENT_OTHER): Payer: Self-pay

## 2021-04-18 MED FILL — Glucose Blood Test Strip: 25 days supply | Qty: 25 | Fill #0 | Status: AC

## 2021-04-19 ENCOUNTER — Other Ambulatory Visit (HOSPITAL_BASED_OUTPATIENT_CLINIC_OR_DEPARTMENT_OTHER): Payer: Self-pay

## 2021-04-19 MED ORDER — ARIPIPRAZOLE 5 MG PO TABS
ORAL_TABLET | ORAL | 0 refills | Status: DC
  Filled 2021-04-19: qty 30, 30d supply, fill #0
  Filled 2021-06-14: qty 30, 30d supply, fill #1

## 2021-04-21 ENCOUNTER — Other Ambulatory Visit (HOSPITAL_BASED_OUTPATIENT_CLINIC_OR_DEPARTMENT_OTHER): Payer: Self-pay

## 2021-05-09 ENCOUNTER — Other Ambulatory Visit (HOSPITAL_BASED_OUTPATIENT_CLINIC_OR_DEPARTMENT_OTHER): Payer: Self-pay

## 2021-05-09 MED ORDER — ARIPIPRAZOLE 5 MG PO TABS: 5.0000 mg | ORAL_TABLET | Freq: Every day | ORAL | 0 refills | Status: DC

## 2021-05-15 ENCOUNTER — Other Ambulatory Visit: Payer: Self-pay

## 2021-05-15 ENCOUNTER — Encounter: Payer: Self-pay | Admitting: Family Medicine

## 2021-05-15 ENCOUNTER — Other Ambulatory Visit (HOSPITAL_BASED_OUTPATIENT_CLINIC_OR_DEPARTMENT_OTHER): Payer: Self-pay

## 2021-05-15 ENCOUNTER — Ambulatory Visit: Payer: 59 | Admitting: Family Medicine

## 2021-05-15 VITALS — BP 108/60 | HR 64 | Temp 98.0°F | Ht 68.0 in | Wt 176.0 lb

## 2021-05-15 DIAGNOSIS — L304 Erythema intertrigo: Secondary | ICD-10-CM | POA: Diagnosis not present

## 2021-05-15 DIAGNOSIS — E1165 Type 2 diabetes mellitus with hyperglycemia: Secondary | ICD-10-CM

## 2021-05-15 DIAGNOSIS — E785 Hyperlipidemia, unspecified: Secondary | ICD-10-CM | POA: Diagnosis not present

## 2021-05-15 LAB — LIPID PANEL
Cholesterol: 126 mg/dL (ref 0–200)
HDL: 33.7 mg/dL — ABNORMAL LOW (ref >39.00)
NonHDL: 92.24
Total CHOL/HDL Ratio: 4
Triglycerides: 325 mg/dL — ABNORMAL HIGH (ref 0.0–149.0)
VLDL: 65 mg/dL — ABNORMAL HIGH (ref 0.0–40.0)

## 2021-05-15 LAB — BASIC METABOLIC PANEL WITH GFR
BUN: 21 mg/dL (ref 6–23)
CO2: 29 meq/L (ref 19–32)
Calcium: 9.7 mg/dL (ref 8.4–10.5)
Chloride: 102 meq/L (ref 96–112)
Creatinine, Ser: 0.98 mg/dL (ref 0.40–1.50)
GFR: 85.71 mL/min
Glucose, Bld: 124 mg/dL — ABNORMAL HIGH (ref 70–99)
Potassium: 4.6 meq/L (ref 3.5–5.1)
Sodium: 141 meq/L (ref 135–145)

## 2021-05-15 LAB — LDL CHOLESTEROL, DIRECT: Direct LDL: 52 mg/dL

## 2021-05-15 MED ORDER — KETOCONAZOLE 2 % EX CREA
1.0000 "application " | TOPICAL_CREAM | Freq: Every day | CUTANEOUS | 0 refills | Status: AC
  Filled 2021-05-15: qty 30, 30d supply, fill #0

## 2021-05-15 NOTE — Patient Instructions (Signed)
Give us 2-3 business days to get the results of your labs back.   Keep the diet clean and stay active.  I recommend getting the flu shot in mid October. This suggestion would change if the CDC comes out with a different recommendation.   Let us know if you need anything. 

## 2021-05-15 NOTE — Progress Notes (Addendum)
Subjective:   Chief Complaint  Patient presents with   Follow-up    Shawn Perez is a 55 y.o. male here for follow-up of diabetes.   Shawn Perez's self monitored glucose range is low 100's.  Patient denies hypoglycemic reactions. He checks his glucose levels 3 time(s) per week. Patient does not require insulin.   Medications include: Jardiance 25 mg/d, metformin 1000 mg bid.  Diet is healthier.  Exercise: none No Cp or SOB.   Hyperlipidemia Patient presents for dyslipidemia follow up. Currently being treated with Crestor 20 mg/d and compliance with treatment thus far has been good. He denies myalgias. Diet/exercise as above.  The patient is not known to have coexisting coronary artery disease.  Past Medical History:  Diagnosis Date   Anxiety    Diabetes mellitus without complication ([place])    type 2   Family history of adverse reaction to anesthesia    Shawn Perez has trouble waking up both times under anesthesia diagnosed with sleep apnea   Heart murmur    since birth   Hyperlipidemia 2011   Hypertension    Sleep apnea    wears cpap   Temporomandibular jaw dysfunction      Related testing: Retinal exam: Done Pneumovax: done  Objective:  BP 108/60   Pulse 64   Temp 98 F (36.7 C) (Oral)   Ht 5\' 8"  (1.727 m)   Wt 176 lb (79.8 kg)   SpO2 97%   BMI 26.76 kg/m  General:  Well developed, well nourished, in no apparent distress Skin:  Flat patch that is pinkish under both armpits Head:  Normocephalic, atraumatic Eyes:  Pupils equal and round, sclera anicteric without injection  Lungs:  CTAB, no access msc use Cardio:  RRR, no bruits, no LE edema Psych: Age appropriate judgment and insight  Assessment:   Type 2 diabetes mellitus with hyperglycemia, without long-term current use of insulin ([facility]) - Plan: Basic metabolic panel, Lipid panel  Hyperlipidemia LDL goal <70  Intertrigo   Plan:   Chronic, unstable. For now, cont Metformin 1000 mg bid and Jardiance 25  mg/d. Counseled on diet and exercise. If cholesterol nml, will have him come in on 11/16 for A1c.  Chronic, unstable. Cont Crestor 20 mg/d for now. If stable, will have original f/u.  F/u in in 5 mo. The patient voiced understanding and agreement to the plan.  [place], DO 05/15/21 7:58 AM

## 2021-05-17 ENCOUNTER — Other Ambulatory Visit (HOSPITAL_BASED_OUTPATIENT_CLINIC_OR_DEPARTMENT_OTHER): Payer: Self-pay

## 2021-06-14 ENCOUNTER — Other Ambulatory Visit (HOSPITAL_BASED_OUTPATIENT_CLINIC_OR_DEPARTMENT_OTHER): Payer: Self-pay

## 2021-07-17 ENCOUNTER — Other Ambulatory Visit (HOSPITAL_BASED_OUTPATIENT_CLINIC_OR_DEPARTMENT_OTHER): Payer: Self-pay

## 2021-08-03 DIAGNOSIS — H401134 Primary open-angle glaucoma, bilateral, indeterminate stage: Secondary | ICD-10-CM | POA: Diagnosis not present

## 2021-08-03 DIAGNOSIS — H31012 Macula scars of posterior pole (postinflammatory) (post-traumatic), left eye: Secondary | ICD-10-CM | POA: Diagnosis not present

## 2021-08-03 DIAGNOSIS — E119 Type 2 diabetes mellitus without complications: Secondary | ICD-10-CM | POA: Diagnosis not present

## 2021-08-03 DIAGNOSIS — H43812 Vitreous degeneration, left eye: Secondary | ICD-10-CM | POA: Diagnosis not present

## 2021-08-03 LAB — HM DIABETES EYE EXAM

## 2021-08-04 ENCOUNTER — Encounter: Payer: Self-pay | Admitting: Family Medicine

## 2021-08-08 ENCOUNTER — Other Ambulatory Visit (HOSPITAL_BASED_OUTPATIENT_CLINIC_OR_DEPARTMENT_OTHER): Payer: Self-pay

## 2021-08-08 DIAGNOSIS — F411 Generalized anxiety disorder: Secondary | ICD-10-CM | POA: Diagnosis not present

## 2021-08-08 DIAGNOSIS — F330 Major depressive disorder, recurrent, mild: Secondary | ICD-10-CM | POA: Diagnosis not present

## 2021-08-08 MED ORDER — ARIPIPRAZOLE 5 MG PO TABS
5.0000 mg | ORAL_TABLET | Freq: Every day | ORAL | 0 refills | Status: DC
  Filled 2021-08-08: qty 90, 90d supply, fill #0

## 2021-08-09 ENCOUNTER — Encounter: Payer: Self-pay | Admitting: Family Medicine

## 2021-08-09 ENCOUNTER — Other Ambulatory Visit: Payer: Self-pay | Admitting: Family Medicine

## 2021-08-09 DIAGNOSIS — Z1211 Encounter for screening for malignant neoplasm of colon: Secondary | ICD-10-CM

## 2021-08-09 NOTE — Progress Notes (Signed)
re

## 2021-08-14 ENCOUNTER — Other Ambulatory Visit (HOSPITAL_BASED_OUTPATIENT_CLINIC_OR_DEPARTMENT_OTHER): Payer: Self-pay

## 2021-08-24 ENCOUNTER — Encounter: Payer: Self-pay | Admitting: Family Medicine

## 2021-08-24 ENCOUNTER — Encounter: Payer: Self-pay | Admitting: Gastroenterology

## 2021-09-21 ENCOUNTER — Other Ambulatory Visit (HOSPITAL_BASED_OUTPATIENT_CLINIC_OR_DEPARTMENT_OTHER): Payer: Self-pay

## 2021-09-22 ENCOUNTER — Telehealth: Payer: Self-pay | Admitting: *Deleted

## 2021-09-22 NOTE — Telephone Encounter (Signed)
Please review chart and advise if ok for procedure in [place] ?

## 2021-10-10 ENCOUNTER — Ambulatory Visit (AMBULATORY_SURGERY_CENTER): Payer: BC Managed Care – PPO | Admitting: *Deleted

## 2021-10-10 ENCOUNTER — Other Ambulatory Visit: Payer: Self-pay

## 2021-10-10 VITALS — Ht 68.0 in | Wt 180.0 lb

## 2021-10-10 DIAGNOSIS — Z1211 Encounter for screening for malignant neoplasm of colon: Secondary | ICD-10-CM

## 2021-10-10 NOTE — Progress Notes (Signed)
No egg or soy allergy known to patient  No issues known to pt with past sedation with any surgeries or procedures Patient denies ever being told they had issues or difficulty with intubation  No FH of Malignant Hyperthermia Pt is not on diet pills Pt is not on  home 02  Pt is not on blood thinners  Pt denies issues with constipation  No A fib or A flutter  Pt is not vaccinated  for Covid    Due to the COVID-19 pandemic we are asking patients to follow certain guidelines in PV and the [place]   Pt aware of COVID protocols and [facility] guidelines   PV completed over the phone. Pt verified name, DOB, address and insurance during PV today.  Pt mailed instruction packet with copy of consent form to read and not return, and instructions.  Pt encouraged to call with questions or issues.  If pt has My chart, procedure instructions sent via My Chart    Sample sheet of over the counter items for prep to purchase sent.

## 2021-10-11 ENCOUNTER — Other Ambulatory Visit (HOSPITAL_BASED_OUTPATIENT_CLINIC_OR_DEPARTMENT_OTHER): Payer: Self-pay

## 2021-10-12 ENCOUNTER — Other Ambulatory Visit (HOSPITAL_BASED_OUTPATIENT_CLINIC_OR_DEPARTMENT_OTHER): Payer: Self-pay

## 2021-10-16 ENCOUNTER — Ambulatory Visit: Payer: BC Managed Care – PPO | Admitting: Family Medicine

## 2021-10-16 ENCOUNTER — Encounter: Payer: Self-pay | Admitting: Family Medicine

## 2021-10-16 ENCOUNTER — Other Ambulatory Visit (HOSPITAL_BASED_OUTPATIENT_CLINIC_OR_DEPARTMENT_OTHER): Payer: Self-pay

## 2021-10-16 VITALS — BP 120/68 | HR 74 | Temp 97.4°F | Resp 16 | Ht 68.0 in | Wt 176.0 lb

## 2021-10-16 DIAGNOSIS — S29012A Strain of muscle and tendon of back wall of thorax, initial encounter: Secondary | ICD-10-CM | POA: Diagnosis not present

## 2021-10-16 MED ORDER — TIZANIDINE HCL 4 MG PO TABS
4.0000 mg | ORAL_TABLET | Freq: Four times a day (QID) | ORAL | 0 refills | Status: DC | PRN
Start: 2021-10-16 — End: 2021-11-15
  Filled 2021-10-16: qty 30, 8d supply, fill #0

## 2021-10-16 MED ORDER — MELOXICAM 15 MG PO TABS
15.0000 mg | ORAL_TABLET | Freq: Every day | ORAL | 0 refills | Status: DC
  Filled 2021-10-16: qty 30, 30d supply, fill #0

## 2021-10-16 NOTE — Progress Notes (Signed)
Musculoskeletal Exam  Patient: Shawn Perez DOB: [birth year]  DOS: 10/16/2021  SUBJECTIVE:  Chief Complaint:   Chief Complaint  Patient presents with   Back Pain    Here for complains upper right shoulder pain onset for 2 weeks   Shoulder Pain    Shawn Perez is a 56 y.o.  male for evaluation and treatment of R upper shoulder pain.   Onset:  2 weeks ago. No inj or change in activity.  Location:  Character:  aching  Progression of issue:  has worsened Associated symptoms: radiates down RUE No bruising, redness, swelling Treatment: to date has been ice, acetaminophen, and topical NSAIDs, heat.   Neurovascular symptoms: no  Past Medical History:  Diagnosis Date   Anxiety    Diabetes mellitus without complication ([place])    type 2   Family history of adverse reaction to anesthesia    [name] has trouble waking up both times under anesthesia diagnosed with sleep apnea   GERD (gastroesophageal reflux disease)    "had 4-5 years ago"   Heart murmur    since birth   Hyperlipidemia 2011   Hypertension    Sleep apnea    wears cpap   Temporomandibular jaw dysfunction     Objective: VITAL SIGNS: BP 120/68 (BP Location: Right Arm, Patient Position: Sitting, Cuff Size: Small)    Pulse 74    Temp (!) 97.4 F (36.3 C) (Oral)    Resp 16    Ht 5\' 8"  (1.727 m)    Wt 176 lb (79.8 kg)    SpO2 95%    BMI 26.76 kg/m  Constitutional: Well formed, well developed. No acute distress. Thorax & Lungs: No accessory muscle use Musculoskeletal: R upper back.   Normal active range of motion: yes.   Normal passive range of motion: yes Tenderness to palpation: yes over R rhomboid Deformity: no Ecchymosis: no Tests positive: none Tests negative: [place] s Neurologic: Normal sensory function. No focal deficits noted. DTR's equal and symmetric in UE's. No clonus. Psychiatric: Normal mood. Age appropriate judgment and insight. Alert & oriented x 3.    Assessment:  Strain of rhomboid  muscle, initial encounter - Plan: tiZANidine (ZANAFLEX) 4 MG tablet, meloxicam (MOBIC) 15 MG tablet  Plan: Stretches/exercises, heat, ice, Tylenol. NSAIDs, muscle relaxer. PT if no better.  F/u in 1 mo for DM visit. The patient voiced understanding and agreement to the plan.   [place], DO 10/16/21  11:21 AM

## 2021-10-16 NOTE — Patient Instructions (Signed)
Heat (pad or rice pillow in microwave) over affected area, 10-15 minutes twice daily.   Ice/cold pack over area for 10-15 min twice daily.  OK to take Tylenol 1000 mg (2 extra strength tabs) or 975 mg (3 regular strength tabs) every 6 hours as needed.  Let us know if you need anything.   Mid-Back Strain Rehab It is normal to feel mild stretching, pulling, tightness, or discomfort as you do these exercises, but you should stop right away if you feel sudden pain or your pain gets worse.   Stretching and range of motion exercises This exercise warms up your muscles and joints and improves the movement and flexibility of your back and shoulders. This exercise also help to relieve pain. Exercise A: Chest and spine stretch    Lie down on your back on a firm surface. Roll a towel or a small blanket so it is about 4 inches (10 cm) in diameter. Put the towel lengthwise under the middle of your back so it is under your spine, but not under your shoulder blades. To increase the stretch, you may put your hands behind your head and let your elbows fall to your sides. Hold for 30 seconds. Repeat exercise 2 times. Complete this exercise 3 times per week.  Strengthening exercises These exercises build strength and endurance in your back and your shoulder blade muscles. Endurance is the ability to use your muscles for a long time, even after they get tired. Exercise B: Alternating arm and leg raises    Get on your hands and knees on a firm surface. If you are on a hard floor, you may want to use padding to cushion your knees, such as an exercise mat. Line up your arms and legs. Your hands should be below your shoulders, and your knees should be below your hips. Lift your left leg behind you. At the same time, raise your right arm and straighten it in front of you. Do not lift your leg higher than your hip. Do not lift your arm higher than your shoulder. Keep your abdominal and back muscles  tight. Keep your hips facing the ground. Do not arch your back. Keep your balance carefully, and do not hold your breath. Hold for 3 seconds. Slowly return to the starting position and repeat with your right leg and your left arm. Repeat 2 times. Complete this exercise 3 times per week. Exercise C: Straight arm rows (shoulder extension)     Stand with your feet shoulder width apart. Secure an exercise band to a stable object in front of you so the band is at or above shoulder height. Hold one end of the exercise band in each hand. Straighten your elbows and lift your hands up to shoulder height. Step back, away from the secured end of the exercise band, until the band stretches. Squeeze your shoulder blades together and pull your hands down to the sides of your thighs. Stop when your hands are straight down by your sides. Do not let your hands go behind your body. Hold for 3 seconds. Slowly return to the starting position. Repeat 2 times. Complete this exercise 3 times per week. Exercise D: Shoulder external rotation, prone Lie on your abdomen on a firm bed so your left / right forearm hangs over the edge of the bed and your upper arm is on the bed, straight out from your body. Your elbow should be bent. Your palm should be facing your feet. If instructed, hold a  5 lb weight in your hand. Squeeze your shoulder blade toward the middle of your back. Do not let your shoulder lift toward your ear. Keep your elbow bent in an "L" shape (90 degrees) while you slowly move your forearm up toward the ceiling. Move your forearm up to the height of the bed, toward your head. Your upper arm should not move. At the top of the movement, your palm should face the floor. Hold for 3 seconds. Slowly return to the starting position and relax your muscles. Repeat 3 times. Complete this exercise 3 times per week. Exercise E: Scapular retraction and external rotation, rowing    Sit in a stable chair  without armrests, or stand. Secure an exercise band to a stable object in front of you so it is at shoulder height. Hold one end of the exercise band in each hand. Bring your arms out straight in front of you. Step back, away from the secured end of the exercise band, until the band stretches. Pull the band backward. As you do this, bend your elbows and squeeze your shoulder blades together, but avoid letting the rest of your body move. Do not let your shoulders lift up toward your ears. Stop when your elbows are at your sides or slightly behind your body. Hold for 5 seconds. Slowly straighten your arms to return to the starting position. Repeat 2 times. Complete this exercise 3 times per week. Posture and body mechanics    Body mechanics refers to the movements and positions of your body while you do your daily activities. Posture is part of body mechanics. Good posture and healthy body mechanics can help to relieve stress in your body's tissues and joints. Good posture means that your spine is in its natural S-curve position (your spine is neutral), your shoulders are pulled back slightly, and your head is not tipped forward. The following are general guidelines for applying improved posture and body mechanics to your everyday activities. Standing    When standing, keep your spine neutral and your feet about hip-width apart. Keep a slight bend in your knees. Your ears, shoulders, and hips should line up. When you do a task in which you lean forward while standing in one place for a long time, place one foot up on a stable object that is 2-4 inches (5-10 cm) high, such as a footstool. This helps keep your spine neutral. Sitting    When sitting, keep your spine neutral and keep your feet flat on the floor. Use a footrest, if necessary, and keep your thighs parallel to the floor. Avoid rounding your shoulders, and avoid tilting your head forward. When working at a desk or a computer, keep your  desk at a height where your hands are slightly lower than your elbows. Slide your chair under your desk so you are close enough to maintain good posture. When working at a computer, place your monitor at a height where you are looking straight ahead and you do not have to tilt your head forward or downward to look at the screen. Resting    When lying down and resting, avoid positions that are most painful for you. If you have pain with activities such as sitting, bending, stooping, or squatting (flexion-based activities), lie in a position in which your body does not bend very much. For example, avoid curling up on your side with your arms and knees near your chest (fetal position). If you have pain with activities such as standing for  a long time or reaching with your arms (extension-based activities), lie with your spine in a neutral position and bend your knees slightly. Try the following positions: Lying on your side with a pillow between your knees. Lying on your back with a pillow under your knees.   Lifting    When lifting objects, keep your feet at least shoulder-width apart and tighten your abdominal muscles. Bend your knees and hips and keep your spine neutral. It is important to lift using the strength of your legs, not your back. Do not lock your knees straight out. Always ask for help to lift heavy or awkward objects. Make sure you discuss any questions you have with your health care provider.

## 2021-10-20 ENCOUNTER — Encounter: Payer: Self-pay | Admitting: Gastroenterology

## 2021-10-23 ENCOUNTER — Other Ambulatory Visit (HOSPITAL_BASED_OUTPATIENT_CLINIC_OR_DEPARTMENT_OTHER): Payer: Self-pay

## 2021-10-23 ENCOUNTER — Telehealth: Payer: Self-pay | Admitting: Gastroenterology

## 2021-10-23 NOTE — Telephone Encounter (Signed)
Called and let pt know that he can continue his medications as long as he takes them before his cut off time of 6:30am tomorrow morning. Pt verbalized understanding and had no furher concerns.

## 2021-10-23 NOTE — Telephone Encounter (Signed)
Inbound call from patient states he recently began taking the following medications Tizanidine and Meloxicam and would like to know if he would be fine to continue for his upcoming procedure 2/28?

## 2021-10-24 ENCOUNTER — Ambulatory Visit (AMBULATORY_SURGERY_CENTER): Payer: BC Managed Care – PPO | Admitting: Gastroenterology

## 2021-10-24 ENCOUNTER — Encounter: Payer: Self-pay | Admitting: Gastroenterology

## 2021-10-24 ENCOUNTER — Other Ambulatory Visit (INDEPENDENT_AMBULATORY_CARE_PROVIDER_SITE_OTHER): Payer: BC Managed Care – PPO

## 2021-10-24 VITALS — BP 164/89 | HR 60 | Temp 98.0°F | Resp 19 | Ht 68.0 in | Wt 180.0 lb

## 2021-10-24 DIAGNOSIS — D125 Benign neoplasm of sigmoid colon: Secondary | ICD-10-CM | POA: Diagnosis not present

## 2021-10-24 DIAGNOSIS — D124 Benign neoplasm of descending colon: Secondary | ICD-10-CM

## 2021-10-24 DIAGNOSIS — K6389 Other specified diseases of intestine: Secondary | ICD-10-CM

## 2021-10-24 DIAGNOSIS — D120 Benign neoplasm of cecum: Secondary | ICD-10-CM | POA: Diagnosis not present

## 2021-10-24 DIAGNOSIS — K5730 Diverticulosis of large intestine without perforation or abscess without bleeding: Secondary | ICD-10-CM

## 2021-10-24 DIAGNOSIS — Z1211 Encounter for screening for malignant neoplasm of colon: Secondary | ICD-10-CM

## 2021-10-24 DIAGNOSIS — K640 First degree hemorrhoids: Secondary | ICD-10-CM

## 2021-10-24 HISTORY — PX: COLONOSCOPY WITH PROPOFOL: SHX5780

## 2021-10-24 LAB — BUN: BUN: 8 mg/dL (ref 6–23)

## 2021-10-24 LAB — CREATININE, SERUM: Creatinine, Ser: 0.93 mg/dL (ref 0.40–1.50)

## 2021-10-24 MED ORDER — SODIUM CHLORIDE 0.9 % IV SOLN: 500.0000 mL | Freq: Once | INTRAVENOUS | Status: DC

## 2021-10-24 NOTE — Progress Notes (Signed)
Pt's states no medical or surgical changes since previsit or office visit. 

## 2021-10-24 NOTE — Progress Notes (Signed)
Called to room to assist during endoscopic procedure.  Patient ID and intended procedure confirmed with present staff. Received instructions for my participation in the procedure from the performing physician.  

## 2021-10-24 NOTE — Patient Instructions (Addendum)
Handouts given on polyps, hemorrhoids, diverticulosis.    My nurse will call you to schedule the CT scan and office visit.  CT contrast provided.     YOU HAD AN ENDOSCOPIC PROCEDURE TODAY AT [facility]:   Refer to the procedure report that was given to you for any specific questions about what was found during the examination.  If the procedure report does not answer your questions, please call your gastroenterologist to clarify.  If you requested that your care partner not be given the details of your procedure findings, then the procedure report has been included in a sealed envelope for you to review at your convenience later.  YOU SHOULD EXPECT: Some feelings of bloating in the abdomen. Passage of more gas than usual.  Walking can help get rid of the air that was put into your GI tract during the procedure and reduce the bloating. If you had a lower endoscopy (such as a colonoscopy or flexible sigmoidoscopy) you may notice spotting of blood in your stool or on the toilet paper. If you underwent a bowel prep for your procedure, you may not have a normal bowel movement for a few days.  Please Note:  You might notice some irritation and congestion in your nose or some drainage.  This is from the oxygen used during your procedure.  There is no need for concern and it should clear up in a day or so.  SYMPTOMS TO REPORT IMMEDIATELY:  Following lower endoscopy (colonoscopy or flexible sigmoidoscopy):  Excessive amounts of blood in the stool  Significant tenderness or worsening of abdominal pains  Swelling of the abdomen that is new, acute  Fever of 100F or higher   For urgent or emergent issues, a gastroenterologist can be reached at any hour by calling [phone]. Do not use MyChart messaging for urgent concerns.    DIET:  We do recommend a small meal at first, but then you may proceed to your regular diet.  Drink plenty of fluids but you should avoid alcoholic  beverages for 24 hours.  ACTIVITY:  You should plan to take it easy for the rest of today and you should NOT DRIVE or use heavy machinery until tomorrow (because of the sedation medicines used during the test).    FOLLOW UP: Our staff will call the number listed on your records 48-72 hours following your procedure to check on you and address any questions or concerns that you may have regarding the information given to you following your procedure. If we do not reach you, we will leave a message.  We will attempt to reach you two times.  During this call, we will ask if you have developed any symptoms of COVID 19. If you develop any symptoms (ie: fever, flu-like symptoms, shortness of breath, cough etc.) before then, please call [phone].  If you test positive for Covid 19 in the 2 weeks post procedure, please call and report this information to [place].    If any biopsies were taken you will be contacted by phone or by letter within the next 1-3 weeks.  Please call us at [phone] if you have not heard about the biopsies in 3 weeks.    SIGNATURES/CONFIDENTIALITY: You and/or your care partner have signed paperwork which will be entered into your electronic medical record.  These signatures attest to the fact that that the information above on your After Visit Summary has been reviewed and is understood.  Full responsibility of the  confidentiality of this discharge information lies with you and/or your care-partner.

## 2021-10-24 NOTE — Progress Notes (Signed)
GASTROENTEROLOGY PROCEDURE H&P NOTE   Primary Care Physician: Shelda Pal, DO    Reason for Procedure:  Colon Cancer screening  Plan:    Colonoscopy  Patient is appropriate for endoscopic procedure(s) in the ambulatory ([facility]) setting.  The nature of the procedure, as well as the risks, benefits, and alternatives were carefully and thoroughly reviewed with the patient. Ample time for discussion and questions allowed. The patient understood, was satisfied, and agreed to proceed.     HPI: Shawn Perez is a 56 y.o. male who presents for colonoscopy for routine Colon Cancer screening.  No active GI symptoms.  Fhx notable for father with colon cancer.  Patient is otherwise without complaints or active issues today.  Past Medical History:  Diagnosis Date   Anxiety    Diabetes mellitus without complication ([place])    type 2   Family history of adverse reaction to anesthesia    [name] has trouble waking up both times under anesthesia diagnosed with sleep apnea   GERD (gastroesophageal reflux disease)    "had 4-5 years ago"   Heart murmur    since birth   Hyperlipidemia 2011   Hypertension    Sleep apnea    wears cpap   Temporomandibular jaw dysfunction     Past Surgical History:  Procedure Laterality Date   COLONOSCOPY     INGUINAL HERNIA REPAIR N/A 04/09/2018   Procedure: LAPAROSCOPIC RIGHT INGUINAL AND BILATERAL FEMORAL HERNIA REPAIR;  Surgeon: Michael Boston, MD;  Location: [facility];  Service: General;  Laterality: N/A;   INSERTION OF MESH N/A 04/09/2018   Procedure: INSERTION OF MESH;  Surgeon: Michael Boston, MD;  Location: [facility];  Service: General;  Laterality: N/A;   NO PAST SURGERIES  07/09/2011   Denies surgical history   WISDOM TOOTH EXTRACTION      Prior to Admission medications   Medication Sig Start Date End Date Taking? Authorizing Provider  albuterol (VENTOLIN HFA) 108 (90 Base) MCG/ACT inhaler Inhale 2 puffs into the lungs every 6 (six) hours  as needed for wheezing or shortness of breath. 09/07/19   Debbrah Alar, NP  ARIPiprazole (ABILIFY) 5 MG tablet Take 1 tablet (5 mg total) by mouth daily. 08/08/21     aspirin 81 MG EC tablet Take 1 tablet (81 mg total) by mouth daily. Swallow whole. 04/17/21   Shelda Pal, DO  Blood Glucose Monitoring Suppl (ONE TOUCH ULTRA SYSTEM KIT) W/DEVICE KIT 1 kit by Does not apply route once. 08/11/14   Nicky Pugh C, NP  empagliflozin (JARDIANCE) 25 MG TABS tablet Take 1 tablet (25 mg total) by mouth daily. 04/12/21   Shelda Pal, DO  Lancets [facility] ULTRASOFT) lancets Use as instructed 08/11/14   Irene Pap, NP  latanoprost (XALATAN) 0.005 % ophthalmic solution Place 1 drop into both eyes at bedtime.  11/29/11   [provider]  losartan (COZAAR) 50 MG tablet Take 1 tablet (50 mg total) by mouth daily. 04/12/21   Shelda Pal, DO  meloxicam (MOBIC) 15 MG tablet Take 1 tablet (15 mg total) by mouth daily. 10/16/21   Shelda Pal, DO  metFORMIN (GLUCOPHAGE) 1000 MG tablet TAKE 1 TABLET BY MOUTH TWICE DAILY WITH A MEAL 04/12/21 04/12/22  Wendling, Crosby Oyster, DO  rosuvastatin (CRESTOR) 20 MG tablet Take 1 tablet (20 mg total) by mouth daily. 04/12/21   Shelda Pal, DO  tamsulosin (FLOMAX) 0.4 MG CAPS capsule Take 1 capsule (0.4 mg total) by mouth daily  after supper. 12/16/19   Shelda Pal, DO  tiZANidine (ZANAFLEX) 4 MG tablet Take 1 tablet (4 mg total) by mouth every 6 (six) hours as needed for muscle spasms. 10/16/21   Shelda Pal, DO  vitamin B-12 (CYANOCOBALAMIN) 1000 MCG tablet TAKE 1 TABLET BY MOUTH DAILY FOR SEVEN DAYS, THEN 1 TABLET WEEKLY THERAFTER 09/24/16   Kuneff, Renee A, DO    Current Outpatient Medications  Medication Sig Dispense Refill   albuterol (VENTOLIN HFA) 108 (90 Base) MCG/ACT inhaler Inhale 2 puffs into the lungs every 6 (six) hours as needed for wheezing or shortness of breath. 6.7 g 0    ARIPiprazole (ABILIFY) 5 MG tablet Take 1 tablet (5 mg total) by mouth daily. 90 tablet 0   aspirin 81 MG EC tablet Take 1 tablet (81 mg total) by mouth daily. Swallow whole. 30 tablet 12   Blood Glucose Monitoring Suppl (ONE TOUCH ULTRA SYSTEM KIT) W/DEVICE KIT 1 kit by Does not apply route once. 1 each 0   empagliflozin (JARDIANCE) 25 MG TABS tablet Take 1 tablet (25 mg total) by mouth daily. 90 tablet 2   Lancets (ONETOUCH ULTRASOFT) lancets Use as instructed 100 each 12   latanoprost (XALATAN) 0.005 % ophthalmic solution Place 1 drop into both eyes at bedtime.      losartan (COZAAR) 50 MG tablet Take 1 tablet (50 mg total) by mouth daily. 90 tablet 2   meloxicam (MOBIC) 15 MG tablet Take 1 tablet (15 mg total) by mouth daily. 30 tablet 0   metFORMIN (GLUCOPHAGE) 1000 MG tablet TAKE 1 TABLET BY MOUTH TWICE DAILY WITH A MEAL 180 tablet 3   rosuvastatin (CRESTOR) 20 MG tablet Take 1 tablet (20 mg total) by mouth daily. 90 tablet 2   tamsulosin (FLOMAX) 0.4 MG CAPS capsule Take 1 capsule (0.4 mg total) by mouth daily after supper. 30 capsule 5   tiZANidine (ZANAFLEX) 4 MG tablet Take 1 tablet (4 mg total) by mouth every 6 (six) hours as needed for muscle spasms. 30 tablet 0   vitamin B-12 (CYANOCOBALAMIN) 1000 MCG tablet TAKE 1 TABLET BY MOUTH DAILY FOR SEVEN DAYS, THEN 1 TABLET WEEKLY THERAFTER 130 tablet 0   No current facility-administered medications for this visit.    Allergies as of 10/24/2021 - Review Complete 10/16/2021  Allergen Reaction Noted   Fenofibrate Rash 02/15/2014   Penicillins Rash 07/09/2011    Family History  Problem Relation Age of Onset   Hypertension Mother    Diabetes Mother    Early death Mother    Heart disease Mother    Colon cancer Father    Heart disease Sister    Early death Brother    Heart disease Brother    Hyperlipidemia Brother    Hypertension Brother    Stomach cancer Brother    Heart disease Brother    Early death Maternal Aunt    Heart  disease Maternal Aunt    Early death Maternal Uncle    Heart disease Maternal Uncle    Heart disease Maternal Grandmother    Early death Maternal Grandmother    Esophageal cancer Maternal Grandfather    Rectal cancer Neg Hx     Social History   Socioeconomic History   Marital status: Married    Spouse name: Jeani Hawking   Number of children: Not on file   Years of education: Not on file   Highest education level: Not on file  Occupational History    Employer: [employer]  Tobacco Use   Smoking status: Former    Passive exposure: Current ("daughter smokes")   Smokeless tobacco: Never   Tobacco comments:    quit in 2000 smike 1 ppd  Vaping Use   Vaping Use: Never used  Substance and Sexual Activity   Alcohol use: Yes    Alcohol/week: 2.0 - 3.0 standard drinks    Types: 2 - 3 Glasses of wine per week    Comment: daily-beer   Drug use: No    Comment: cbd oil   Sexual activity: Yes  Other Topics Concern   Not on file  Social History Narrative   Not on file   Social Determinants of Health   Financial Resource Strain: Not on file  Food Insecurity: Not on file  Transportation Needs: Not on file  Physical Activity: Not on file  Stress: Not on file  Social Connections: Not on file  Intimate Partner Violence: Not on file    Physical Exam: Vital signs in last 24 hours: '@There'  were no vitals taken for this visit. GEN: NAD EYE: Sclerae anicteric ENT: MMM CV: Non-tachycardic Pulm: CTA b/l GI: Soft, NT/ND NEURO:  Alert & Oriented x 3   Gerrit Heck, DO [facility]   10/24/2021 8:21 AM

## 2021-10-24 NOTE — Op Note (Signed)
[facility] Patient Name: Shawn Perez Procedure Date: 10/24/2021 8:51 AM MRN: [id number] Endoscopist: Gerrit Heck , MD Age: 56 Referring MD:  Date of Birth: 1966-02-29 Gender: Male Account #: [id number] Procedure:                Colonoscopy Indications:              Screening for colorectal malignant neoplasm                           Father diagnosed with colon cancer in his 33's.                            Otherwise, no active GI symptoms. Medicines:                Monitored Anesthesia Care Procedure:                Pre-Anesthesia Assessment:                           - Prior to the procedure, a History and Physical                            was performed, and patient medications and                            allergies were reviewed. The patient's tolerance of                            previous anesthesia was also reviewed. The risks                            and benefits of the procedure and the sedation                            options and risks were discussed with the patient.                            All questions were answered, and informed consent                            was obtained. Prior Anticoagulants: The patient has                            taken no previous anticoagulant or antiplatelet                            agents. ASA Grade Assessment: III - A patient with                            severe systemic disease. After reviewing the risks                            and benefits, the patient was deemed in  satisfactory condition to undergo the procedure.                           After obtaining informed consent, the colonoscope                            was passed under direct vision. Throughout the                            procedure, the patient's blood pressure, pulse, and                            oxygen saturations were monitored continuously. The                            PCF-HQ190L Colonoscope was  introduced through the                            anus and advanced to the the cecum, identified by                            appendiceal orifice and ileocecal valve. The                            colonoscopy was performed without difficulty. The                            patient tolerated the procedure well. The quality                            of the bowel preparation was good. The ileocecal                            valve, appendiceal orifice, and rectum were                            photographed. Scope In: 9:07:11 AM Scope Out: 9:39:40 AM Scope Withdrawal Time: 0 hours 29 minutes 51 seconds  Total Procedure Duration: 0 hours 32 minutes 29 seconds  Findings:                 The perianal and digital rectal examinations were                            normal.                           A frond-like/villous large mass was found in the                            cecum and proximal ascending colon. The mass was                            non-circumferential but did obscure the ileocecal  valve. No bleeding was present. This was biopsied                            with a cold forceps for histology. Estimated blood                            loss was minimal. There was an additional small, 5                            mm polyp in the ascending colon and another 15 mm                            flat, laterally spreading polyp in the proximal                            ascending colon. These were not removed today as                            they will fall within the surgical resection area                            for the mass. Area 3 cm distal to the flat,                            laterally spreading ascending colon polyp was                            tattooed with an injection of 2 mL of Spot (carbon                            black) to ensure inclusion in surgical margin.                           Two sessile polyps were found in the descending                             colon. The polyps were 4 to 5 mm in size. These                            polyps were removed with a cold snare. Resection                            and retrieval were complete. Estimated blood loss                            was minimal.                           A 5 mm polyp was found in the sigmoid colon. The                            polyp was sessile. The  polyp was removed with a                            cold snare. Resection and retrieval were complete.                            Estimated blood loss was minimal.                           Multiple small-mouthed diverticula were found in                            the sigmoid colon.                           Non-bleeding internal hemorrhoids were found during                            retroflexion. The hemorrhoids were small. Complications:            No immediate complications. Estimated Blood Loss:     Estimated blood loss was minimal. 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 Biopsied. Tattoo placed distal to                            a flat, laterally spreading polyp in the proximal                            ascending colon.                           - Two 4 to 5 mm polyps in the descending colon,                            removed with a cold snare. Resected and retrieved.                           - One 5 mm polyp in the sigmoid colon, removed with                            a cold snare. Resected and retrieved.                           - Diverticulosis in the sigmoid colon.                           - Non-bleeding internal hemorrhoids. Recommendation:           - Patient has a contact  number available for                            emergencies. The signs and symptoms of potential                            delayed complications were discussed  with the                            patient. Return to normal activities tomorrow.                            Written discharge instructions were provided to the                            patient.                           - Resume previous diet.                           - Continue present medications.                           - Await pathology results.                           - Check CEA.                           - Perform a CT scan (computed tomography) of chest,                            abdomen, and pelvis with contrast at the next                            available appointment.                           - Refer to a Dealer.                           - Return to GI clinic in 3-4 weeks. Gerrit Heck, MD 10/24/2021 9:50:25 AM

## 2021-10-24 NOTE — Progress Notes (Signed)
VSS, transported to PACU °

## 2021-10-25 ENCOUNTER — Telehealth: Payer: Self-pay

## 2021-10-25 ENCOUNTER — Other Ambulatory Visit: Payer: Self-pay

## 2021-10-25 DIAGNOSIS — K6389 Other specified diseases of intestine: Secondary | ICD-10-CM

## 2021-10-25 LAB — CEA: CEA: <2 ng/mL

## 2021-10-25 NOTE — Telephone Encounter (Signed)
Spoke with pt to schedule GI clinic appt as requested on colonoscopy report from 10/24/21. Pt is scheduled for 11/15/21 at 1:20 pm with Dr. Bryan Lemma. Order placed for CT chest abd pelvis with contrast. Secure message sent to schedulers. Faxed referral to [facility]. Pt verbalized understanding and had no other concerns at end of call.  ?

## 2021-10-26 ENCOUNTER — Encounter: Payer: Self-pay | Admitting: Family Medicine

## 2021-10-26 ENCOUNTER — Telehealth: Payer: Self-pay | Admitting: *Deleted

## 2021-10-26 ENCOUNTER — Encounter: Payer: Self-pay | Admitting: Gastroenterology

## 2021-10-26 NOTE — Telephone Encounter (Signed)
?  Follow up Call- ? ?Call back number 10/24/2021  ?Post procedure Call Back phone  # [phone]  ?Permission to leave phone message Yes  ?Some recent data might be hidden  ?  ? ?Patient questions: ? ?Do you have a fever, pain , or abdominal swelling? No. ?Pain Score  0 * ? ?Have you tolerated food without any problems? Yes.   ? ?Have you been able to return to your normal activities? Yes.   ? ?Do you have any questions about your discharge instructions: ?Diet   No. ?Medications  No. ?Follow up visit  No. ? ?Do you have questions or concerns about your Care? No. ? ?Actions: ?* If pain score is 4 or above: ?No action needed, pain <4. ? ?Have you developed a fever since your procedure? no ? ?2.   Have you had an respiratory symptoms (SOB or cough) since your procedure? no ? ?3.   Have you tested positive for COVID 19 since your procedure no ? ?4.   Have you had any family members/close contacts diagnosed with the COVID 19 since your procedure?  no ? ? ?If yes to any of these questions please route to Joylene John, RN and Joella Prince, RN  ? ? ?

## 2021-10-27 NOTE — Telephone Encounter (Signed)
I spoke with the patient and his wife today by phone.  We discussed biopsy results.  The mass that was biopsied returned as a tubular adenoma.  I discussed that this may be only representative of surface mucosal biopsies, with malignancy deeper to the biopsy specimen.  Either way, this needs to be removed surgically as I do not believe endoscopic resection is possible given appearance and obstruction/invasion of the ileocecal valve.  He has an appointment in the surgical office next week, along with CT C/A/P scheduled for 3/16.  Has follow-up with me later this month. ? ?The remainder of the polyps removed were also tubular adenomas. ? ?All questions answered and they were appreciative of the phone call.  Will make a plan for surveillance timing after surgical resection and work-up above. ?

## 2021-11-03 ENCOUNTER — Other Ambulatory Visit (HOSPITAL_BASED_OUTPATIENT_CLINIC_OR_DEPARTMENT_OTHER): Payer: Self-pay

## 2021-11-03 DIAGNOSIS — D122 Benign neoplasm of ascending colon: Secondary | ICD-10-CM | POA: Diagnosis not present

## 2021-11-03 MED ORDER — METRONIDAZOLE 500 MG PO TABS
ORAL_TABLET | ORAL | 0 refills | Status: DC
  Filled 2021-11-03: qty 6, 3d supply, fill #0

## 2021-11-06 ENCOUNTER — Other Ambulatory Visit (HOSPITAL_BASED_OUTPATIENT_CLINIC_OR_DEPARTMENT_OTHER): Payer: Self-pay

## 2021-11-06 DIAGNOSIS — Z79899 Other long term (current) drug therapy: Secondary | ICD-10-CM | POA: Diagnosis not present

## 2021-11-06 DIAGNOSIS — F320 Major depressive disorder, single episode, mild: Secondary | ICD-10-CM | POA: Diagnosis not present

## 2021-11-06 DIAGNOSIS — F411 Generalized anxiety disorder: Secondary | ICD-10-CM | POA: Diagnosis not present

## 2021-11-06 MED ORDER — ARIPIPRAZOLE 10 MG PO TABS
10.0000 mg | ORAL_TABLET | Freq: Every day | ORAL | 1 refills | Status: DC
  Filled 2021-11-06: qty 90, 90d supply, fill #0
  Filled 2022-02-12: qty 90, 90d supply, fill #1

## 2021-11-07 ENCOUNTER — Other Ambulatory Visit (HOSPITAL_BASED_OUTPATIENT_CLINIC_OR_DEPARTMENT_OTHER): Payer: Self-pay

## 2021-11-09 ENCOUNTER — Other Ambulatory Visit: Payer: Self-pay

## 2021-11-09 ENCOUNTER — Ambulatory Visit (HOSPITAL_COMMUNITY)
Admission: RE | Admit: 2021-11-09 | Discharge: 2021-11-09 | Disposition: A | Discharge status: Home / Self Care | Payer: BC Managed Care – PPO | Source: Ambulatory Visit | Attending: Gastroenterology | Admitting: Gastroenterology

## 2021-11-09 DIAGNOSIS — I700 Atherosclerosis of aorta: Secondary | ICD-10-CM | POA: Diagnosis not present

## 2021-11-09 DIAGNOSIS — K6389 Other specified diseases of intestine: Secondary | ICD-10-CM | POA: Insufficient documentation

## 2021-11-09 DIAGNOSIS — J984 Other disorders of lung: Secondary | ICD-10-CM | POA: Diagnosis not present

## 2021-11-09 DIAGNOSIS — N3289 Other specified disorders of bladder: Secondary | ICD-10-CM | POA: Diagnosis not present

## 2021-11-09 MED ORDER — IOHEXOL 300 MG/ML  SOLN
100.0000 mL | Freq: Once | INTRAMUSCULAR | Status: AC | PRN
  Administered 2021-11-09: 100 mL via INTRAVENOUS

## 2021-11-09 MED ORDER — SODIUM CHLORIDE (PF) 0.9 % IJ SOLN
INTRAMUSCULAR | Status: AC
  Filled 2021-11-09: qty 50

## 2021-11-13 ENCOUNTER — Ambulatory Visit: Payer: BC Managed Care – PPO | Admitting: Family Medicine

## 2021-11-15 ENCOUNTER — Other Ambulatory Visit: Payer: Self-pay

## 2021-11-15 ENCOUNTER — Ambulatory Visit: Payer: BC Managed Care – PPO | Admitting: Gastroenterology

## 2021-11-15 ENCOUNTER — Encounter: Payer: Self-pay | Admitting: Gastroenterology

## 2021-11-15 VITALS — BP 132/80 | HR 74 | Ht 68.0 in | Wt 177.2 lb

## 2021-11-15 DIAGNOSIS — K760 Fatty (change of) liver, not elsewhere classified: Secondary | ICD-10-CM

## 2021-11-15 DIAGNOSIS — Z86010 Personal history of colonic polyps: Secondary | ICD-10-CM

## 2021-11-15 NOTE — Progress Notes (Signed)
? ?Chief Complaint:    Post procedure follow-up, unresectable cecal lesion ? ?HPI:   ? ? ?Patient is a 56 y.o. male presenting to the Gastroenterology Clinic for follow-up.  Underwent colonoscopy on 10/24/2021 for routine screening, with findings as below: ? ?- Colonoscopy (10/24/2021): Large villous mass in the cecum and proximal ascending colon obscuring ileocecal valve, tattoo placed distally and biopsied (path: Adenoma without clear carcinoma).  An additional 5 mm polyp and 15 mm flat laterally spreading polyp were also noted in the proximal ascending colon.  These fell within the tattoo field and will be resected surgically.  2 subcentimeter descending colon adenomatous, 5 mm sigmoid adenoma.  Sigmoid diverticulosis, internal hemorrhoids ?- CEA <2 ?- 11/03/2021: Evaluation in Colorectal Surgery clinic by Dr. Dema Severin with plan for right hemicolectomy for endoscopically unresectable polyp of the cecum ?- CT CAP (11/09/2021): Hepatic steatosis, 3.6 x 2.6 x 2.7 cm cecal mass without adenopathy or evidence of metastasis ? ? ?Scheduled for right hemicolectomy on 12/20/2021 with Dr. Dema Severin. ? ?Today, he states he is otherwise in his usual state of health.  No active GI symptoms. ? ?Fhx n/f father with CRC in his 42's. ? ?Review of systems:     No chest pain, no SOB, no fevers, no urinary sx  ? ?Past Medical History:  ?Diagnosis Date  ? Anxiety   ? Diabetes mellitus without complication ([place])   ? type 2  ? Family history of adverse reaction to anesthesia   ? [name] has trouble waking up both times under anesthesia diagnosed with sleep apnea  ? GERD (gastroesophageal reflux disease)   ? "had 4-5 years ago"  ? Heart murmur   ? since birth  ? Hyperlipidemia 2011  ? Hypertension   ? Sleep apnea   ? wears cpap  ? Temporomandibular jaw dysfunction   ? ? ?Patient's surgical history, family medical history, social history, medications and allergies were all reviewed in Epic  ? ? ?Current Outpatient Medications  ?Medication Sig  Dispense Refill  ? ARIPiprazole (ABILIFY) 10 MG tablet Take 1 tablet (10 mg total) by mouth daily. 90 tablet 1  ? ARIPiprazole (ABILIFY) 5 MG tablet Take 1 tablet (5 mg total) by mouth daily. 90 tablet 0  ? aspirin 81 MG EC tablet Take 1 tablet (81 mg total) by mouth daily. Swallow whole. (Patient not taking: Reported on 10/24/2021) 30 tablet 12  ? Blood Glucose Monitoring Suppl (ONE TOUCH ULTRA SYSTEM KIT) W/DEVICE KIT 1 kit by Does not apply route once. 1 each 0  ? empagliflozin (JARDIANCE) 25 MG TABS tablet Take 1 tablet (25 mg total) by mouth daily. 90 tablet 2  ? Lancets (ONETOUCH ULTRASOFT) lancets Use as instructed 100 each 12  ? latanoprost (XALATAN) 0.005 % ophthalmic solution Place 1 drop into both eyes at bedtime.     ? losartan (COZAAR) 50 MG tablet Take 1 tablet (50 mg total) by mouth daily. 90 tablet 2  ? meloxicam (MOBIC) 15 MG tablet Take 1 tablet (15 mg total) by mouth daily. 30 tablet 0  ? metFORMIN (GLUCOPHAGE) 1000 MG tablet TAKE 1 TABLET BY MOUTH TWICE DAILY WITH A MEAL 180 tablet 3  ? metroNIDAZOLE (FLAGYL) 500 MG tablet Take 1 tablet (500 mg total) by mouth 3 (three) times daily for 3 doses Take according to your procedure colon prep instructions 6 tablet 0  ? rosuvastatin (CRESTOR) 20 MG tablet Take 1 tablet (20 mg total) by mouth daily. 90 tablet 2  ? tiZANidine (ZANAFLEX) 4 MG  tablet Take 1 tablet (4 mg total) by mouth every 6 (six) hours as needed for muscle spasms. 30 tablet 0  ? vitamin B-12 (CYANOCOBALAMIN) 1000 MCG tablet TAKE 1 TABLET BY MOUTH DAILY FOR SEVEN DAYS, THEN 1 TABLET WEEKLY THERAFTER (Patient not taking: Reported on 10/24/2021) 130 tablet 0  ? ?No current facility-administered medications for this visit.  ? ? ?Physical Exam:   ? ? ?There were no vitals taken for this visit. ? ?GENERAL:  Pleasant male in NAD ?PSYCH: Cooperative, normal affect ?NEURO: Alert and oriented x 3, no focal neurologic deficits ? ? ?IMPRESSION and PLAN:   ? ?1) Colon polyps ?- Index colonoscopy with 3  subcentimeter tubular adenomas, along with an unresectable lesion in the cecum which invades the ICV and proximal ascending colon. Path demonstrates adenoma w/o malignancy, but lesion endoscopically unresectable. Lesion see on CT, w/o any e/o metastatic disease or LAD.  ?- Plan to proceed with right hemicolectomy with Dr. Dema Severin next month as planned ? ?2) Hepatic steatosis ?-Discussed incidental finding on recent CT of hepatic steatosis.  Otherwise normal-appearing liver and no duct dilation.  Similar hepatic steatosis noted on CT in 2022 and RUQ [place] in 06/2013.  Most recent liver enzymes were otherwise normal in 2022 ?- Aggressive management of underlying comorbidities to include diabetes, HTN, hyperlipidemia ?- Continue healthy eating habits ?- Regular exercise ?- Discussed potential benefit of drinking black coffee.  Recommended he avoid adding cream ?- Scheduled for routine labs for part of preoperative assessment.  Evaluate for elevated liver enzymes with that panel ? ? ?- RTC in 1 year or sooner prn ?    ?    ? ?[place] ,DO, FACG 11/15/2021, 1:12 PM ? ?

## 2021-11-15 NOTE — Patient Instructions (Addendum)
If you are age 58 or older, your body mass index should be between 23-30. Your Body mass index is 26.95 kg/m?Marland Kitchen If this is out of the aforementioned range listed, please consider follow up with your Primary Care Provider. ? ?If you are age 58 or younger, your body mass index should be between 19-25. Your Body mass index is 26.95 kg/m?Marland Kitchen If this is out of the aformentioned range listed, please consider follow up with your Primary Care Provider.  ? ?________________________________________________________ ? ?The [facility] providers would like to encourage you to use [facility] to communicate with providers for non-urgent requests or questions.  Due to long hold times on the telephone, sending your provider a message by [employer] may be a faster and more efficient way to get a response.  Please allow 48 business hours for a response.  Please remember that this is for non-urgent requests.  ?_______________________________________________________ ? ?Please call in 1 year to schedule follow up appointment. ? ?Please call with any questions or concerns. ? ?It was a pleasure to see you today! ? ?Gerrit Heck, D.O. ? ? ? ? ? ?We want to thank you for trusting [facility] with your care. All of our staff and providers value the relationships we have built with our patients, and it is an honor to care for you.  ? ?We are writing to let you know that [facility] will close on Jan 08, 2022, and we invite you to continue to see Dr. Carmell Austria and Gerrit Heck at the [facility] office location. We are consolidating our serices at these [facility] practices to better provide care. Our office staff will work with you to ensure a seamless transition.  ? ?Gerrit Heck, DO -Dr. Bryan Lemma will be movig to [facility] at [address] [place], [place] [address], effective Jan 08, 2022.  Contact [phone] to schedule an appointment with him.  ? ?Carmell Austria, MD- Dr. Lyndel Safe will be movig to [facility] at [address], [place], [place] [address], effective Jan 08, 2022.  Contact [phone] to schedule an appointment with him.  ? ?Requesting Medical Records ?If you need to request your medical records, please follow the instructions below. Your medical records are confidential, and a copy can be transferred to another provider or released to you or another person you designate only with your permission. ? ?There are several ways to request your medical records: ?Requests for medical records can be submitted through our practice.   ?You can also request your records electronically, in your MyChart account by selecting the ?Request Health Records? tab.  ?If you need additional information on how to request records, please go to [email or web address]/, choose Patient Information, then select Request Medical Records. ?To make an appointment or if you have any questions about your health care needs, please contact our office at [phone] and one of our staff members will be glad to assist you. ?[facility] is committed to providing exceptional care for you and our community. Thank you for allowing [place] to serve your health care needs. ?Sincerely, ? ?Windy Canny, Director [facility] ?[facility] also offers convenient virtual care options. Sore throat? Sinus problems? Cold or flu symptoms? Get care from the comfort of home with [facility] Video Visits and e-Visits. Learn more about the non-emergency conditions treated and start your virtual visit at [email or web address] ? ?

## 2021-11-17 ENCOUNTER — Other Ambulatory Visit (HOSPITAL_BASED_OUTPATIENT_CLINIC_OR_DEPARTMENT_OTHER): Payer: Self-pay

## 2021-11-20 ENCOUNTER — Other Ambulatory Visit (HOSPITAL_BASED_OUTPATIENT_CLINIC_OR_DEPARTMENT_OTHER): Payer: Self-pay

## 2021-11-20 ENCOUNTER — Other Ambulatory Visit: Payer: Self-pay | Admitting: Family Medicine

## 2021-11-20 DIAGNOSIS — E119 Type 2 diabetes mellitus without complications: Secondary | ICD-10-CM

## 2021-11-20 MED ORDER — ONETOUCH ULTRA VI STRP
ORAL_STRIP | 1 refills | Status: DC
  Filled 2021-11-20: qty 50, 50d supply, fill #0
  Filled 2022-07-10 – 2022-07-23 (×2): qty 50, 50d supply, fill #1

## 2021-11-29 ENCOUNTER — Ambulatory Visit: Payer: BC Managed Care – PPO | Admitting: Family Medicine

## 2021-11-29 ENCOUNTER — Other Ambulatory Visit (HOSPITAL_BASED_OUTPATIENT_CLINIC_OR_DEPARTMENT_OTHER): Payer: Self-pay

## 2021-11-29 ENCOUNTER — Other Ambulatory Visit: Payer: Self-pay | Admitting: Family Medicine

## 2021-11-29 ENCOUNTER — Encounter: Payer: Self-pay | Admitting: Family Medicine

## 2021-11-29 VITALS — BP 108/70 | HR 61 | Temp 97.7°F | Ht 68.0 in | Wt 174.4 lb

## 2021-11-29 DIAGNOSIS — E1165 Type 2 diabetes mellitus with hyperglycemia: Secondary | ICD-10-CM

## 2021-11-29 DIAGNOSIS — M4802 Spinal stenosis, cervical region: Secondary | ICD-10-CM

## 2021-11-29 LAB — COMPREHENSIVE METABOLIC PANEL
ALT: 26 U/L (ref 0–53)
AST: 17 U/L (ref 0–37)
Albumin: 4.7 g/dL (ref 3.5–5.2)
Alkaline Phosphatase: 59 U/L (ref 39–117)
BUN: 17 mg/dL (ref 6–23)
CO2: 28 mEq/L (ref 19–32)
Calcium: 9.5 mg/dL (ref 8.4–10.5)
Chloride: 102 mEq/L (ref 96–112)
Creatinine, Ser: 0.8 mg/dL (ref 0.40–1.50)
GFR: 97.99 mL/min (ref >60.00)
Glucose, Bld: 131 mg/dL — ABNORMAL HIGH (ref 70–99)
Potassium: 4.2 mEq/L (ref 3.5–5.1)
Sodium: 140 mEq/L (ref 135–145)
Total Bilirubin: 0.7 mg/dL (ref 0.2–1.2)
Total Protein: 6.5 g/dL (ref 6.0–8.3)

## 2021-11-29 LAB — LIPID PANEL
Cholesterol: 116 mg/dL (ref 0–200)
HDL: 31.5 mg/dL — ABNORMAL LOW (ref >39.00)
NonHDL: 84.26
Total CHOL/HDL Ratio: 4
Triglycerides: 353 mg/dL — ABNORMAL HIGH (ref 0.0–149.0)
VLDL: 70.6 mg/dL — ABNORMAL HIGH (ref 0.0–40.0)

## 2021-11-29 LAB — LDL CHOLESTEROL, DIRECT: Direct LDL: 43 mg/dL

## 2021-11-29 LAB — HEMOGLOBIN A1C: Hgb A1c MFr Bld: 7.9 % — ABNORMAL HIGH (ref 4.6–6.5)

## 2021-11-29 MED ORDER — PIOGLITAZONE HCL 30 MG PO TABS
30.0000 mg | ORAL_TABLET | Freq: Every day | ORAL | 2 refills | Status: DC
Start: 2021-11-29 — End: 2022-02-25
  Filled 2021-11-29: qty 30, 30d supply, fill #0
  Filled 2021-12-25: qty 30, 30d supply, fill #1
  Filled 2022-01-28: qty 30, 30d supply, fill #2

## 2021-11-29 MED ORDER — OMEGA-3-ACID ETHYL ESTERS 1 G PO CAPS
2.0000 g | ORAL_CAPSULE | Freq: Two times a day (BID) | ORAL | 2 refills | Status: DC
  Filled 2021-11-29: qty 120, 30d supply, fill #0
  Filled 2022-01-10: qty 120, 30d supply, fill #1
  Filled 2022-04-12: qty 120, 30d supply, fill #2

## 2021-11-29 NOTE — Progress Notes (Signed)
Chief Complaint  ?Patient presents with  ? Follow-up  ?  Shoulder pain ?Tingling down right arm  ? ? ?Subjective: ?Patient is a 56 y.o. male here for f/u R shoulder/neck pain. Here w spouse.  ? ?Patient is now having 2 months of right shoulder/neck pain.  No recent injury or change in activity.  It is getting worse as he is having tingling in his right upper extremity.  He is not having any weakness.  He tried Zanaflex, stretches/exercises for the rhomboid, and prescription anti-inflammatories without relief. ? ?Diabetes-patient does check her sugars at home, usually low 100s when fasting, high 100s random.  Denies any hypoglycemia.  Compliant with Jardiance 25 mg daily and metformin 1000 mg twice daily.  No chest pain or shortness of breath. ? ?Hyperlipidemia ?Patient presents for hyperlipidemia follow up. ?Currently being treated with Crestor 20 mg/d and compliance with treatment thus far has been good. ?He denies myalgias. ?Diet/exercise as above. ?The patient is not known to have coexisting coronary artery disease. ? ?Past Medical History:  ?Diagnosis Date  ? Anxiety   ? Diabetes mellitus without complication ([place])   ? type 2  ? Family history of adverse reaction to anesthesia   ? [name] has trouble waking up both times under anesthesia diagnosed with sleep apnea  ? GERD (gastroesophageal reflux disease)   ? "had 4-5 years ago"  ? Heart murmur   ? since birth  ? Hyperlipidemia 2011  ? Hypertension   ? Sleep apnea   ? wears cpap  ? Temporomandibular jaw dysfunction   ? ? ?Objective: ?BP 108/70   Pulse 61   Temp 97.7 ?F (36.5 ?C) (Oral)   Ht '5\' 8"'$  (1.727 m)   Wt 174 lb 6 oz (79.1 kg)   SpO2 98%   BMI 26.51 kg/m?  ?General: Awake, appears stated age ?Heart: Brisk capillary refill ?MSK: Mild TTP over the right trapezius and right lateral neck ?Neuro: 2/4 biceps reflex bilaterally without clonus, no cerebellar signs, 5/5 strength throughout in the upper extremities, positive Spurling's on the right ?Lungs: No  accessory muscle use ?Psych: Age appropriate judgment and insight, normal affect and mood ? ?Assessment and Plan: ?Cervical stenosis of spinal canal - Plan: Ambulatory referral to Physical Therapy ? ?Type 2 diabetes mellitus with hyperglycemia, without long-term current use of insulin (HCC) - Plan: Comprehensive metabolic panel, Lipid panel, Hemoglobin A1c ? ?Stretches and exercises, heat, ice, Tylenol.  Refer to physical therapy. ?Chronic, not controlled.  Continue metformin 1000 mg twice daily, Jardiance 25 mg daily.  Check above.  Will consider adding GLP-1.  Counseled on diet and exercise. ?Chronic, stable.  Continue Crestor 20 mg daily. ?Follow-up pending lab results. ?The patient voiced understanding and agreement to the plan. ? ?Shelda Pal, DO ?11/29/21  ?8:43 AM ? ? ? ? ?

## 2021-11-29 NOTE — Patient Instructions (Signed)
If you do not hear anything about your referral in the next 1-2 weeks, call our office and ask for an update. ? ?Ice/cold pack over area for 10-15 min twice daily. ? ?Heat (pad or rice pillow in microwave) over affected area, 10-15 minutes twice daily.  ? ?Let us know if you need anything. ? ?EXERCISES ?RANGE OF MOTION (ROM) AND STRETCHING EXERCISES  ?These exercises may help you when beginning to rehabilitate your issue. In order to successfully resolve your symptoms, you must improve your posture. These exercises are designed to help reduce the forward-head and rounded-shoulder posture which contributes to this condition. Your symptoms may resolve with or without further involvement from your physician, physical therapist or athletic trainer. While completing these exercises, remember:  ?Restoring tissue flexibility helps normal motion to return to the joints. This allows healthier, less painful movement and activity. ?An effective stretch should be held for at least 20 seconds, although you may need to begin with shorter hold times for comfort. ?A stretch should never be painful. You should only feel a gentle lengthening or release in the stretched tissue. ?Do not do any stretch or exercise that you cannot tolerate. ? ?STRETCH- Axial Extensors ?Lie on your back on the floor. You may bend your knees for comfort. Place a rolled-up hand towel or dish towel, about 2 inches in diameter, under the part of your head that makes contact with the floor. ?Gently tuck your chin, as if trying to make a "double chin," until you feel a gentle stretch at the base of your head. ?Hold 15-20 seconds. ?Repeat 2-3 times. Complete this exercise 1 time per day.  ? ?STRETCH - Axial Extension  ?Stand or sit on a firm surface. Assume a good posture: chest up, shoulders drawn back, abdominal muscles slightly tense, knees unlocked (if standing) and feet hip width apart. ?Slowly retract your chin so your head slides back and your chin  slightly lowers. Continue to look straight ahead. ?You should feel a gentle stretch in the back of your head. Be certain not to feel an aggressive stretch since this can cause headaches later. ?Hold for 15-20 seconds. ?Repeat 2-3 times. Complete this exercise 1 time per day. ? ?STRETCH - Cervical Side Bend  ?Stand or sit on a firm surface. Assume a good posture: chest up, shoulders drawn back, abdominal muscles slightly tense, knees unlocked (if standing) and feet hip width apart. ?Without letting your nose or shoulders move, slowly tip your right / left ear to your shoulder until your feel a gentle stretch in the muscles on the opposite side of your neck. ?Hold 15-20 seconds. ?Repeat 2-3 times. Complete this exercise 1-2 times per day. ? ?STRETCH - Cervical Rotators  ?Stand or sit on a firm surface. Assume a good posture: chest up, shoulders drawn back, abdominal muscles slightly tense, knees unlocked (if standing) and feet hip width apart. ?Keeping your eyes level with the ground, slowly turn your head until you feel a gentle stretch along the back and opposite side of your neck. ?Hold 15-20 seconds. ?Repeat 2-3 times. Complete this exercise 1-2 times per day. ? ?RANGE OF MOTION - Neck Circles  ?Stand or sit on a firm surface. Assume a good posture: chest up, shoulders drawn back, abdominal muscles slightly tense, knees unlocked (if standing) and feet hip width apart. ?Gently roll your head down and around from the back of one shoulder to the back of the other. The motion should never be forced or painful. ?Repeat the motion  10-20 times, or until you feel the neck muscles relax and loosen. ?Repeat 2-3 times. Complete the exercise 1-2 times per day. ?STRENGTHENING EXERCISES - Cervical Strain and Sprain ?These exercises may help you when beginning to rehabilitate your injury. They may resolve your symptoms with or without further involvement from your physician, physical therapist, or athletic trainer. While  completing these exercises, remember:  ?Muscles can gain both the endurance and the strength needed for everyday activities through controlled exercises. ?Complete these exercises as instructed by your physician, physical therapist, or athletic trainer. Progress the resistance and repetitions only as guided. ?You may experience muscle soreness or fatigue, but the pain or discomfort you are trying to eliminate should never worsen during these exercises. If this pain does worsen, stop and make certain you are following the directions exactly. If the pain is still present after adjustments, discontinue the exercise until you can discuss the trouble with your clinician. ? ?STRENGTH - Cervical Flexors, Isometric ?Face a wall, standing about 6 inches away. Place a small pillow, a ball about 6-8 inches in diameter, or a folded towel between your forehead and the wall. ?Slightly tuck your chin and gently push your forehead into the soft object. Push only with mild to moderate intensity, building up tension gradually. Keep your jaw and forehead relaxed. ?Hold 10 to 20 seconds. Keep your breathing relaxed. ?Release the tension slowly. Relax your neck muscles completely before you start the next repetition. ?Repeat 2-3 times. Complete this exercise 1 time per day. ? ?STRENGTH- Cervical Lateral Flexors, Isometric  ?Stand about 6 inches away from a wall. Place a small pillow, a ball about 6-8 inches in diameter, or a folded towel between the side of your head and the wall. ?Slightly tuck your chin and gently tilt your head into the soft object. Push only with mild to moderate intensity, building up tension gradually. Keep your jaw and forehead relaxed. ?Hold 10 to 20 seconds. Keep your breathing relaxed. ?Release the tension slowly. Relax your neck muscles completely before you start the next repetition. ?Repeat 2-3 times. Complete this exercise 1 time per day. ? ?STRENGTH - Cervical Extensors, Isometric  ?Stand about 6 inches  away from a wall. Place a small pillow, a ball about 6-8 inches in diameter, or a folded towel between the back of your head and the wall. ?Slightly tuck your chin and gently tilt your head back into the soft object. Push only with mild to moderate intensity, building up tension gradually. Keep your jaw and forehead relaxed. ?Hold 10 to 20 seconds. Keep your breathing relaxed. ?Release the tension slowly. Relax your neck muscles completely before you start the next repetition. ?Repeat 2-3 times. Complete this exercise 1 time per day. ? ?POSTURE AND BODY MECHANICS CONSIDERATIONS ?Keeping correct posture when sitting, standing or completing your activities will reduce the stress put on different body tissues, allowing injured tissues a chance to heal and limiting painful experiences. The following are general guidelines for improved posture. Your physician or physical therapist will provide you with any instructions specific to your needs. While reading these guidelines, remember: ?The exercises prescribed by your provider will help you have the flexibility and strength to maintain correct postures. ?The correct posture provides the optimal environment for your joints to work. All of your joints have less wear and tear when properly supported by a spine with good posture. This means you will experience a healthier, less painful body. ?Correct posture must be practiced with all of your activities, especially  prolonged sitting and standing. Correct posture is as important when doing repetitive low-stress activities (typing) as it is when doing a single heavy-load activity (lifting). ? ?PROLONGED STANDING WHILE SLIGHTLY LEANING FORWARD ?When completing a task that requires you to lean forward while standing in one place for a long time, place either foot up on a stationary 2- to 4-inch high object to help maintain the best posture. When both feet are on the ground, the low back tends to lose its slight inward curve. If  this curve flattens (or becomes too large), then the back and your other joints will experience too much stress, fatigue more quickly, and can cause pain.  ? ?RESTING POSITIONS ?Consider which positions are most pai

## 2021-11-29 NOTE — Progress Notes (Signed)
Surgery orders requested via Epic inbox. °

## 2021-11-30 ENCOUNTER — Ambulatory Visit: Payer: Self-pay | Admitting: Surgery

## 2021-11-30 DIAGNOSIS — Z01818 Encounter for other preprocedural examination: Secondary | ICD-10-CM

## 2021-12-06 NOTE — Patient Instructions (Addendum)
DUE TO COVID-19 ONLY ONE VISITOR  (aged 58 and older)  IS ALLOWED TO COME WITH YOU AND STAY IN THE WAITING ROOM ONLY DURING PRE OP AND PROCEDURE.   ?**NO VISITORS ARE ALLOWED IN THE SHORT STAY AREA OR RECOVERY ROOM!!** ? ? Your procedure is scheduled on: 12/20/21 ? ? Report to [facility] Main Entrance ? ?  Report to admitting at 8:45 AM ? ? Call this number if you have problems the morning of surgery [phone] ? ? Do not eat food :After Midnight. ? ? After Midnight you may have the following liquids until 8:00 AM DAY OF SURGERY ? ?Water ?Black Coffee (sugar ok, NO MILK/CREAM OR CREAMERS)  ?Tea (sugar ok, NO MILK/CREAM OR CREAMERS) regular and decaf                             ?Plain Jell-O (NO RED)                                           ?Fruit ices (not with fruit pulp, NO RED)                                     ?Popsicles (NO RED)                                                                  ?Juice: apple, WHITE grape, WHITE cranberry ?Sports drinks like Gatorade (NO RED) ?Clear broth(vegetable,chicken,beef) ? ?             ?Drink 2 G2 drinks AT 10:00 PM the night before surgery.   ? ?  ?  ?The day of surgery:  ?Drink ONE (1) Pre-Surgery G2 at 8:00 AM the morning of surgery. Drink in one sitting. Do not sip.  ?This drink was given to you during your hospital  ?pre-op appointment visit. ?Nothing else to drink after completing the  ?Pre-Surgery G2. ?  ?       If you have questions, please contact your surgeon?s office. ? ? ?FOLLOW BOWEL PREP AND ANY ADDITIONAL PRE OP INSTRUCTIONS YOU RECEIVED FROM YOUR SURGEON'S OFFICE!!! ?  ?  ?Oral Hygiene is also important to reduce your risk of infection.                                    ?Remember - BRUSH YOUR TEETH THE MORNING OF SURGERY WITH YOUR REGULAR TOOTHPASTE ? ? Take these medicines the morning of surgery with A SIP OF WATER: Abilify, Rosuvastatin.  ? ?DO NOT TAKE ANY ORAL DIABETIC MEDICATIONS DAY OF YOUR SURGERY ? ?How to Manage Your  Diabetes ?Before and After Surgery ? ?Why is it important to control my blood sugar before and after surgery? ?Improving blood sugar levels before and after surgery helps healing and can limit problems. ?A way of improving blood sugar control is eating a healthy diet by: ? Eating less sugar and carbohydrates ? Increasing activity/exercise ? Talking with  your doctor about reaching your blood sugar goals ?High blood sugars (greater than 180 mg/dL) can raise your risk of infections and slow your recovery, so you will need to focus on controlling your diabetes during the weeks before surgery. ?Make sure that the doctor who takes care of your diabetes knows about your planned surgery including the date and location. ? ?How do I manage my blood sugar before surgery? ?Check your blood sugar at least 4 times a day, starting 2 days before surgery, to make sure that the level is not too high or low. ?Check your blood sugar the morning of your surgery when you wake up and every 2 hours until you get to the Short Stay unit. ?If your blood sugar is less than 70 mg/dL, you will need to treat for low blood sugar: ?Do not take insulin. ?Treat a low blood sugar (less than 70 mg/dL) with ? cup of clear juice (cranberry or apple), 4 glucose tablets, OR glucose gel. ?Recheck blood sugar in 15 minutes after treatment (to make sure it is greater than 70 mg/dL). If your blood sugar is not greater than 70 mg/dL on recheck, call [phone] for further instructions. ?Report your blood sugar to the short stay nurse when you get to Short Stay. ? ?If you are admitted to the hospital after surgery: ?Your blood sugar will be checked by the staff and you will probably be given insulin after surgery (instead of oral diabetes medicines) to make sure you have good blood sugar levels. ?The goal for blood sugar control after surgery is 80-180 mg/dL. ? ? ?WHAT DO I DO ABOUT MY DIABETES MEDICATION? ? ?Do not take oral diabetes medicines (pills) the  morning of surgery. ? ?THE DAY BEFORE SURGERY, take Metformin and ACTOS as prescribed. Do not take Jardiance.      ? ? ?THE MORNING OF SURGERY, do not take Metformin, ACTOS, or Jardiance. ? ?Reviewed and Endorsed by [facility] Patient Education Committee, August 2015  ? ?Bring CPAP mask and tubing day of surgery. ?                  ?           You may not have any metal on your body including jewelry, and body piercing ? ?           Do not wear lotions, powders, cologne, or deodorant  ? ?            Men may shave face and neck. ? ? Do not bring valuables to the hospital. [facility] NOT ?            RESPONSIBLE   FOR VALUABLES. ?  ? Patients discharged on the day of surgery will not be allowed to drive home.  Someone NEEDS to stay with you for the first 24 hours after anesthesia. ? ?            Please read over the following fact sheets you were given: [facility] [place] [phone]- Apolonio Schneiders ? ?   [place] - Preparing for Surgery ?Before surgery, you can play an important role.  Because skin is not sterile, your skin needs to be as free of germs as possible.  You can reduce the number of germs on your skin by washing with CHG (chlorahexidine gluconate) soap before surgery.  CHG is an antiseptic cleaner which kills germs and bonds with the skin to continue killing  germs even after washing. ?Please DO NOT use if you have an allergy to CHG or antibacterial soaps.  If your skin becomes reddened/irritated stop using the CHG and inform your nurse when you arrive at Short Stay. ?Do not shave (including legs and underarms) for at least 48 hours prior to the first CHG shower.  You may shave your face/neck. ? ?Please follow these instructions carefully: ? 1.  Shower with CHG Soap the night before surgery and the  morning of surgery. ? 2.  If you choose to wash your hair, wash your hair first as usual with your normal  shampoo. ? 3.  After you shampoo, rinse your hair and  body thoroughly to remove the shampoo.                            ? 4.  Use CHG as you would any other liquid soap.  You can apply chg directly to the skin and wash.  Gently with a scrungie or clean washcloth. ? 5.  Apply the CHG Soap to your body ONLY FROM THE NECK DOWN.   Do   not use on face/ open      ?                     Wound or open sores. Avoid contact with eyes, ears mouth and   genitals (private parts).  ?                     Production manager,  Genitals (private parts) with your normal soap. ?            6.  Wash thoroughly, paying special attention to the area where your    surgery  will be performed. ? 7.  Thoroughly rinse your body with warm water from the neck down. ? 8.  DO NOT shower/wash with your normal soap after using and rinsing off the CHG Soap. ?               9.  Pat yourself dry with a clean towel. ?           10.  Wear clean pajamas. ?           11.  Place clean sheets on your bed the night of your first shower and do not  sleep with pets. ?Day of Surgery : ?Do not apply any lotions/deodorants the morning of surgery.  Please wear clean clothes to the hospital/surgery center. ? ?FAILURE TO FOLLOW THESE INSTRUCTIONS MAY RESULT IN THE CANCELLATION OF YOUR SURGERY ? ?PATIENT SIGNATURE_________________________________ ? ?NURSE SIGNATURE__________________________________ ? ?________________________________________________________________________  ? ?Incentive Spirometer ? ?An incentive spirometer is a tool that can help keep your lungs clear and active. This tool measures how well you are filling your lungs with each breath. Taking long deep breaths may help reverse or decrease the chance of developing breathing (pulmonary) problems (especially infection) following: ?A long period of time when you are unable to move or be active. ?BEFORE THE PROCEDURE  ?If the spirometer includes an indicator to show your best effort, your nurse or respiratory therapist will set it to a desired goal. ?If possible, sit  up straight or lean slightly forward. Try not to slouch. ?Hold the incentive spirometer in an upright position. ?INSTRUCTIONS FOR USE  ?Sit on the edge of your bed if possible, or sit up as far as you can in bed or on  a

## 2021-12-06 NOTE — Progress Notes (Addendum)
COVID Vaccine Completed: no ?Date COVID Vaccine completed: ?Has received booster: ?COVID vaccine manufacturer: [place]  ? ?Date of COVID positive in last 90 days: no ? ?PCP - Riki Sheer, DO ?Cardiologist - n/a ? ?Chest x-ray - CT 11/09/21 Epic ?EKG - 12/07/21 Epic/chart ?Stress Test - 2013 ?ECHO - n/a ?Cardiac Cath - n/a ?Pacemaker/ICD device last checked: n/a ?Spinal Cord Stimulator: n/a ? ?Bowel Prep - yes patient has received instructions and has supplies ? ?Sleep Study - yes, positive ?CPAP - yes every night  ? ?Fasting Blood Sugar - 102-180 ?Checks Blood Sugar 2 times a week ? ?Blood Thinner Instructions: n/a ?Aspirin Instructions: ?Last Dose: ? ?Activity level: Can go up a flight of stairs and perform activities of daily living without stopping and without symptoms of chest pain or shortness of breath. ?   ?Anesthesia review: HTN, Dm 2, OSA ? ?Patient denies shortness of breath, fever, cough and chest pain at PAT appointment ? ? ?Patient verbalized understanding of instructions that were given to them at the PAT appointment. Patient was also instructed that they will need to review over the PAT instructions again at home before surgery.  ?

## 2021-12-07 ENCOUNTER — Encounter (HOSPITAL_COMMUNITY): Payer: Self-pay

## 2021-12-07 ENCOUNTER — Encounter (HOSPITAL_COMMUNITY)
Admission: RE | Admit: 2021-12-07 | Discharge: 2021-12-07 | Disposition: A | Discharge status: Home / Self Care | Payer: BC Managed Care – PPO | Source: Ambulatory Visit | Attending: Surgery | Admitting: Surgery

## 2021-12-07 VITALS — BP 119/67 | HR 71 | Temp 97.7°F | Resp 18 | Ht 68.0 in | Wt 168.4 lb

## 2021-12-07 DIAGNOSIS — Z01818 Encounter for other preprocedural examination: Secondary | ICD-10-CM | POA: Diagnosis not present

## 2021-12-07 DIAGNOSIS — I10 Essential (primary) hypertension: Secondary | ICD-10-CM | POA: Diagnosis not present

## 2021-12-07 LAB — CBC
HCT: 49.7 % (ref 39.0–52.0)
Hemoglobin: 16.4 g/dL (ref 13.0–17.0)
MCH: 31.7 pg (ref 26.0–34.0)
MCHC: 33 g/dL (ref 30.0–36.0)
MCV: 96.1 fL (ref 80.0–100.0)
Platelets: 234 10*3/uL (ref 150–400)
RBC: 5.17 MIL/uL (ref 4.22–5.81)
RDW: 12.1 % (ref 11.5–15.5)
WBC: 4.9 10*3/uL (ref 4.0–10.5)
nRBC: 0 % (ref 0.0–0.2)

## 2021-12-07 LAB — TYPE AND SCREEN
ABO/RH(D): A POS
Antibody Screen: NEGATIVE

## 2021-12-07 LAB — HEMOGLOBIN A1C
Hgb A1c MFr Bld: 7.4 % — ABNORMAL HIGH (ref 4.8–5.6)
Mean Plasma Glucose: 165.68 mg/dL

## 2021-12-07 LAB — GLUCOSE, CAPILLARY: Glucose-Capillary: 141 mg/dL — ABNORMAL HIGH (ref 70–99)

## 2021-12-19 ENCOUNTER — Encounter (HOSPITAL_COMMUNITY): Payer: Self-pay | Admitting: Surgery

## 2021-12-20 ENCOUNTER — Inpatient Hospital Stay (HOSPITAL_COMMUNITY)
Admission: RE | Admit: 2021-12-20 | Discharge: 2021-12-23 | DRG: 331 | Disposition: A | Discharge status: Home / Self Care | Payer: BC Managed Care – PPO | Attending: Surgery | Admitting: Surgery

## 2021-12-20 ENCOUNTER — Encounter (HOSPITAL_COMMUNITY)
Admission: RE | Disposition: A | Discharge status: Home / Self Care | Payer: Self-pay | Source: Home / Self Care | Attending: Surgery

## 2021-12-20 ENCOUNTER — Other Ambulatory Visit: Payer: Self-pay

## 2021-12-20 ENCOUNTER — Ambulatory Visit (HOSPITAL_COMMUNITY): Payer: BC Managed Care – PPO | Admitting: Anesthesiology

## 2021-12-20 ENCOUNTER — Ambulatory Visit (HOSPITAL_COMMUNITY): Payer: BC Managed Care – PPO | Admitting: Physician Assistant

## 2021-12-20 ENCOUNTER — Encounter (HOSPITAL_COMMUNITY): Payer: Self-pay | Admitting: Surgery

## 2021-12-20 DIAGNOSIS — E785 Hyperlipidemia, unspecified: Secondary | ICD-10-CM | POA: Diagnosis present

## 2021-12-20 DIAGNOSIS — D122 Benign neoplasm of ascending colon: Secondary | ICD-10-CM | POA: Diagnosis not present

## 2021-12-20 DIAGNOSIS — E119 Type 2 diabetes mellitus without complications: Secondary | ICD-10-CM | POA: Diagnosis not present

## 2021-12-20 DIAGNOSIS — Z800 Family history of malignant neoplasm of digestive organs: Secondary | ICD-10-CM | POA: Diagnosis not present

## 2021-12-20 DIAGNOSIS — Z87891 Personal history of nicotine dependence: Secondary | ICD-10-CM

## 2021-12-20 DIAGNOSIS — K635 Polyp of colon: Principal | ICD-10-CM | POA: Diagnosis present

## 2021-12-20 DIAGNOSIS — Z7984 Long term (current) use of oral hypoglycemic drugs: Secondary | ICD-10-CM

## 2021-12-20 DIAGNOSIS — Z8042 Family history of malignant neoplasm of prostate: Secondary | ICD-10-CM | POA: Diagnosis not present

## 2021-12-20 DIAGNOSIS — I10 Essential (primary) hypertension: Secondary | ICD-10-CM | POA: Diagnosis present

## 2021-12-20 DIAGNOSIS — K219 Gastro-esophageal reflux disease without esophagitis: Secondary | ICD-10-CM | POA: Diagnosis not present

## 2021-12-20 DIAGNOSIS — Z9049 Acquired absence of other specified parts of digestive tract: Principal | ICD-10-CM

## 2021-12-20 DIAGNOSIS — F411 Generalized anxiety disorder: Secondary | ICD-10-CM | POA: Diagnosis not present

## 2021-12-20 DIAGNOSIS — Z8249 Family history of ischemic heart disease and other diseases of the circulatory system: Secondary | ICD-10-CM | POA: Diagnosis not present

## 2021-12-20 DIAGNOSIS — D120 Benign neoplasm of cecum: Secondary | ICD-10-CM | POA: Diagnosis not present

## 2021-12-20 DIAGNOSIS — Z833 Family history of diabetes mellitus: Secondary | ICD-10-CM | POA: Diagnosis not present

## 2021-12-20 DIAGNOSIS — K388 Other specified diseases of appendix: Secondary | ICD-10-CM | POA: Diagnosis not present

## 2021-12-20 HISTORY — PX: LAPAROSCOPIC RIGHT HEMI COLECTOMY: SHX5926

## 2021-12-20 LAB — GLUCOSE, CAPILLARY
Glucose-Capillary: 209 mg/dL — ABNORMAL HIGH (ref 70–99)
Glucose-Capillary: 163 mg/dL — ABNORMAL HIGH (ref 70–99)
Glucose-Capillary: 175 mg/dL — ABNORMAL HIGH (ref 70–99)

## 2021-12-20 LAB — ABO/RH: ABO/RH(D): A POS

## 2021-12-20 SURGERY — LAPAROSCOPIC RIGHT HEMI COLECTOMY
Anesthesia: General | Laterality: Right

## 2021-12-20 MED ORDER — IBUPROFEN 400 MG PO TABS
600.0000 mg | ORAL_TABLET | Freq: Four times a day (QID) | ORAL | Status: DC | PRN
  Administered 2021-12-20 – 2021-12-21 (×2): 600 mg via ORAL
  Filled 2021-12-20 (×3): qty 1

## 2021-12-20 MED ORDER — DIPHENHYDRAMINE HCL 50 MG/ML IJ SOLN: 12.5000 mg | Freq: Four times a day (QID) | INTRAMUSCULAR | Status: DC | PRN

## 2021-12-20 MED ORDER — ORAL CARE MOUTH RINSE: 15.0000 mL | Freq: Once | OROMUCOSAL | Status: AC

## 2021-12-20 MED ORDER — HYDROMORPHONE HCL 1 MG/ML IJ SOLN
0.5000 mg | INTRAMUSCULAR | Status: DC | PRN
  Administered 2021-12-20: 0.5 mg via INTRAVENOUS
  Filled 2021-12-20: qty 0.5

## 2021-12-20 MED ORDER — ALUM & MAG HYDROXIDE-SIMETH 200-200-20 MG/5ML PO SUSP: 30.0000 mL | Freq: Four times a day (QID) | ORAL | Status: DC | PRN

## 2021-12-20 MED ORDER — PROPOFOL 10 MG/ML IV BOLUS
INTRAVENOUS | Status: DC | PRN
  Administered 2021-12-20: 150 mg via INTRAVENOUS

## 2021-12-20 MED ORDER — ONDANSETRON HCL 4 MG/2ML IJ SOLN
4.0000 mg | Freq: Four times a day (QID) | INTRAMUSCULAR | Status: DC | PRN
  Administered 2021-12-20: 4 mg via INTRAVENOUS
  Filled 2021-12-20 (×2): qty 2

## 2021-12-20 MED ORDER — DIPHENHYDRAMINE HCL 12.5 MG/5ML PO ELIX: 12.5000 mg | ORAL_SOLUTION | Freq: Four times a day (QID) | ORAL | Status: DC | PRN

## 2021-12-20 MED ORDER — ROCURONIUM BROMIDE 10 MG/ML (PF) SYRINGE
PREFILLED_SYRINGE | INTRAVENOUS | Status: DC | PRN
Start: 2021-12-20 — End: 2021-12-20
  Administered 2021-12-20: 60 mg via INTRAVENOUS
  Administered 2021-12-20: 10 mg via INTRAVENOUS

## 2021-12-20 MED ORDER — LACTATED RINGERS IV SOLN: INTRAVENOUS | Status: DC

## 2021-12-20 MED ORDER — LIDOCAINE HCL (PF) 2 % IJ SOLN
INTRAMUSCULAR | Status: AC
  Filled 2021-12-20: qty 5

## 2021-12-20 MED ORDER — ENSURE SURGERY PO LIQD
237.0000 mL | Freq: Two times a day (BID) | ORAL | Status: DC
  Administered 2021-12-21: 237 mL via ORAL

## 2021-12-20 MED ORDER — HEPARIN SODIUM (PORCINE) 5000 UNIT/ML IJ SOLN
5000.0000 [IU] | Freq: Once | INTRAMUSCULAR | Status: AC
  Administered 2021-12-20: 5000 [IU] via SUBCUTANEOUS
  Filled 2021-12-20: qty 1

## 2021-12-20 MED ORDER — MIDAZOLAM HCL 2 MG/2ML IJ SOLN
INTRAMUSCULAR | Status: AC
  Filled 2021-12-20: qty 2

## 2021-12-20 MED ORDER — CHLORHEXIDINE GLUCONATE CLOTH 2 % EX PADS: 6.0000 | MEDICATED_PAD | Freq: Once | CUTANEOUS | Status: DC

## 2021-12-20 MED ORDER — BUPIVACAINE-EPINEPHRINE (PF) 0.25% -1:200000 IJ SOLN
INTRAMUSCULAR | Status: AC
  Filled 2021-12-20: qty 30

## 2021-12-20 MED ORDER — BISACODYL 5 MG PO TBEC: 20.0000 mg | DELAYED_RELEASE_TABLET | Freq: Once | ORAL | Status: DC

## 2021-12-20 MED ORDER — BUPIVACAINE LIPOSOME 1.3 % IJ SUSP
INTRAMUSCULAR | Status: AC
  Filled 2021-12-20: qty 20

## 2021-12-20 MED ORDER — BUPIVACAINE LIPOSOME 1.3 % IJ SUSP
INTRAMUSCULAR | Status: DC | PRN
  Administered 2021-12-20: 20 mL

## 2021-12-20 MED ORDER — HYDROMORPHONE HCL 1 MG/ML IJ SOLN
INTRAMUSCULAR | Status: AC
  Filled 2021-12-20: qty 2

## 2021-12-20 MED ORDER — BUPIVACAINE LIPOSOME 1.3 % IJ SUSP
20.0000 mL | Freq: Once | INTRAMUSCULAR | Status: DC
Start: 2021-12-20 — End: 2021-12-20

## 2021-12-20 MED ORDER — INSULIN ASPART 100 UNIT/ML IJ SOLN: 0.0000 [IU] | Freq: Every day | INTRAMUSCULAR | Status: DC

## 2021-12-20 MED ORDER — CHLORHEXIDINE GLUCONATE 0.12 % MT SOLN
15.0000 mL | Freq: Once | OROMUCOSAL | Status: AC
  Administered 2021-12-20: 15 mL via OROMUCOSAL

## 2021-12-20 MED ORDER — FENTANYL CITRATE (PF) 100 MCG/2ML IJ SOLN
INTRAMUSCULAR | Status: DC | PRN
  Administered 2021-12-20: 100 ug via INTRAVENOUS
  Administered 2021-12-20 (×2): 50 ug via INTRAVENOUS

## 2021-12-20 MED ORDER — ROSUVASTATIN CALCIUM 20 MG PO TABS
20.0000 mg | ORAL_TABLET | Freq: Every day | ORAL | Status: DC
  Administered 2021-12-21 – 2021-12-23 (×3): 20 mg via ORAL
  Filled 2021-12-20 (×3): qty 1

## 2021-12-20 MED ORDER — INSULIN ASPART 100 UNIT/ML IJ SOLN
0.0000 [IU] | Freq: Three times a day (TID) | INTRAMUSCULAR | Status: DC
  Administered 2021-12-21: 2 [IU] via SUBCUTANEOUS
  Administered 2021-12-22: 1 [IU] via SUBCUTANEOUS
  Administered 2021-12-22: 3 [IU] via SUBCUTANEOUS

## 2021-12-20 MED ORDER — LACTATED RINGERS IR SOLN
Status: DC | PRN
  Administered 2021-12-20: 1000 mL

## 2021-12-20 MED ORDER — BUPIVACAINE-EPINEPHRINE 0.25% -1:200000 IJ SOLN
INTRAMUSCULAR | Status: DC | PRN
  Administered 2021-12-20: 30 mL

## 2021-12-20 MED ORDER — ENSURE PRE-SURGERY PO LIQD: 296.0000 mL | Freq: Once | ORAL | Status: DC

## 2021-12-20 MED ORDER — ACETAMINOPHEN 500 MG PO TABS
1000.0000 mg | ORAL_TABLET | Freq: Four times a day (QID) | ORAL | Status: DC
  Administered 2021-12-20 – 2021-12-23 (×9): 1000 mg via ORAL
  Filled 2021-12-20 (×10): qty 2

## 2021-12-20 MED ORDER — LATANOPROST 0.005 % OP SOLN
1.0000 [drp] | Freq: Every day | OPHTHALMIC | Status: DC
  Administered 2021-12-20 – 2021-12-22 (×3): 1 [drp] via OPHTHALMIC
  Filled 2021-12-20: qty 2.5

## 2021-12-20 MED ORDER — 0.9 % SODIUM CHLORIDE (POUR BTL) OPTIME
TOPICAL | Status: DC | PRN
  Administered 2021-12-20: 1000 mL

## 2021-12-20 MED ORDER — MIDAZOLAM HCL 5 MG/5ML IJ SOLN
INTRAMUSCULAR | Status: DC | PRN
Start: 2021-12-20 — End: 2021-12-20
  Administered 2021-12-20: 2 mg via INTRAVENOUS

## 2021-12-20 MED ORDER — HEPARIN SODIUM (PORCINE) 5000 UNIT/ML IJ SOLN
5000.0000 [IU] | Freq: Three times a day (TID) | INTRAMUSCULAR | Status: DC
  Administered 2021-12-20 – 2021-12-21 (×4): 5000 [IU] via SUBCUTANEOUS
  Filled 2021-12-20 (×4): qty 1

## 2021-12-20 MED ORDER — ACETAMINOPHEN 500 MG PO TABS
1000.0000 mg | ORAL_TABLET | ORAL | Status: AC
  Administered 2021-12-20: 1000 mg via ORAL
  Filled 2021-12-20: qty 2

## 2021-12-20 MED ORDER — SIMETHICONE 80 MG PO CHEW: 40.0000 mg | CHEWABLE_TABLET | Freq: Four times a day (QID) | ORAL | Status: DC | PRN

## 2021-12-20 MED ORDER — HYDROMORPHONE HCL 1 MG/ML IJ SOLN
0.2500 mg | INTRAMUSCULAR | Status: DC | PRN
  Administered 2021-12-20 (×2): 0.5 mg via INTRAVENOUS

## 2021-12-20 MED ORDER — ENSURE PRE-SURGERY PO LIQD: 592.0000 mL | Freq: Once | ORAL | Status: DC

## 2021-12-20 MED ORDER — HYDRALAZINE HCL 20 MG/ML IJ SOLN: 10.0000 mg | INTRAMUSCULAR | Status: DC | PRN

## 2021-12-20 MED ORDER — GENTAMICIN SULFATE 40 MG/ML IJ SOLN
5.0000 mg/kg | INTRAVENOUS | Status: AC
  Administered 2021-12-20: 400 mg via INTRAVENOUS
  Filled 2021-12-20 (×2): qty 10

## 2021-12-20 MED ORDER — DEXAMETHASONE SODIUM PHOSPHATE 10 MG/ML IJ SOLN
INTRAMUSCULAR | Status: DC | PRN
  Administered 2021-12-20: 10 mg via INTRAVENOUS

## 2021-12-20 MED ORDER — TRAMADOL HCL 50 MG PO TABS
50.0000 mg | ORAL_TABLET | Freq: Four times a day (QID) | ORAL | Status: DC | PRN
  Administered 2021-12-20 – 2021-12-23 (×4): 50 mg via ORAL
  Filled 2021-12-20 (×5): qty 1

## 2021-12-20 MED ORDER — ONDANSETRON HCL 4 MG PO TABS: 4.0000 mg | ORAL_TABLET | Freq: Four times a day (QID) | ORAL | Status: DC | PRN

## 2021-12-20 MED ORDER — POLYETHYLENE GLYCOL 3350 17 GM/SCOOP PO POWD: 1.0000 | Freq: Once | ORAL | Status: DC

## 2021-12-20 MED ORDER — SUGAMMADEX SODIUM 200 MG/2ML IV SOLN
INTRAVENOUS | Status: DC | PRN
Start: 2021-12-20 — End: 2021-12-20
  Administered 2021-12-20: 200 mg via INTRAVENOUS

## 2021-12-20 MED ORDER — FENTANYL CITRATE (PF) 250 MCG/5ML IJ SOLN
INTRAMUSCULAR | Status: AC
  Filled 2021-12-20: qty 5

## 2021-12-20 MED ORDER — LOSARTAN POTASSIUM 50 MG PO TABS
50.0000 mg | ORAL_TABLET | Freq: Every day | ORAL | Status: DC
  Administered 2021-12-21 – 2021-12-23 (×3): 50 mg via ORAL
  Filled 2021-12-20 (×3): qty 1

## 2021-12-20 MED ORDER — ALVIMOPAN 12 MG PO CAPS
12.0000 mg | ORAL_CAPSULE | Freq: Two times a day (BID) | ORAL | Status: DC
  Administered 2021-12-21 (×2): 12 mg via ORAL
  Filled 2021-12-20 (×2): qty 1

## 2021-12-20 MED ORDER — ALVIMOPAN 12 MG PO CAPS
12.0000 mg | ORAL_CAPSULE | ORAL | Status: AC
  Administered 2021-12-20: 12 mg via ORAL
  Filled 2021-12-20: qty 1

## 2021-12-20 MED ORDER — ARIPIPRAZOLE 10 MG PO TABS
10.0000 mg | ORAL_TABLET | Freq: Every day | ORAL | Status: DC
  Administered 2021-12-21 – 2021-12-23 (×3): 10 mg via ORAL
  Filled 2021-12-20 (×3): qty 1

## 2021-12-20 MED ORDER — ROCURONIUM BROMIDE 10 MG/ML (PF) SYRINGE
PREFILLED_SYRINGE | INTRAVENOUS | Status: AC
  Filled 2021-12-20: qty 10

## 2021-12-20 MED ORDER — ONDANSETRON HCL 4 MG/2ML IJ SOLN
INTRAMUSCULAR | Status: DC | PRN
  Administered 2021-12-20: 4 mg via INTRAVENOUS

## 2021-12-20 MED ORDER — KETAMINE HCL 10 MG/ML IJ SOLN
INTRAMUSCULAR | Status: DC | PRN
  Administered 2021-12-20: 10 mg via INTRAVENOUS
  Administered 2021-12-20: 30 mg via INTRAVENOUS

## 2021-12-20 MED ORDER — VANCOMYCIN HCL IN DEXTROSE 1-5 GM/200ML-% IV SOLN
1000.0000 mg | Freq: Once | INTRAVENOUS | Status: AC
  Administered 2021-12-20: 1000 mg via INTRAVENOUS
  Filled 2021-12-20: qty 200

## 2021-12-20 MED ORDER — LIDOCAINE 2% (20 MG/ML) 5 ML SYRINGE
INTRAMUSCULAR | Status: DC | PRN
  Administered 2021-12-20: 80 mg via INTRAVENOUS

## 2021-12-20 SURGICAL SUPPLY — 63 items
APPLIER CLIP ROT 10 11.4 M/L (STAPLE)
BAG COUNTER SPONGE SURGICOUNT (BAG) IMPLANT
BLADE CLIPPER SURG (BLADE) IMPLANT
CABLE HIGH FREQUENCY MONO STRZ (ELECTRODE) ×2 IMPLANT
CELLS DAT CNTRL 66122 CELL SVR (MISCELLANEOUS) IMPLANT
CHLORAPREP W/TINT 26 (MISCELLANEOUS) ×1 IMPLANT
CLIP APPLIE ROT 10 11.4 M/L (STAPLE) IMPLANT
DISSECTOR BLUNT TIP ENDO 5MM (MISCELLANEOUS) IMPLANT
ELECT REM PT RETURN 15FT ADLT (MISCELLANEOUS) ×2 IMPLANT
GAUZE SPONGE 4X4 12PLY STRL (GAUZE/BANDAGES/DRESSINGS) ×2 IMPLANT
GLOVE BIO SURGEON STRL SZ7.5 (GLOVE) ×4 IMPLANT
GLOVE ECLIPSE 8.0 STRL XLNG CF (GLOVE) ×4 IMPLANT
GOWN STRL REUS W/ TWL XL LVL3 (GOWN DISPOSABLE) ×4 IMPLANT
GOWN STRL REUS W/TWL XL LVL3 (GOWN DISPOSABLE) ×4
IRRIG SUCT STRYKERFLOW 2 WTIP (MISCELLANEOUS) ×2
IRRIGATION SUCT STRKRFLW 2 WTP (MISCELLANEOUS) IMPLANT
KIT TURNOVER KIT A (KITS) ×1 IMPLANT
LIGASURE IMPACT 36 18CM CVD LR (INSTRUMENTS) IMPLANT
NS IRRIG 1000ML POUR BTL (IV SOLUTION) ×2 IMPLANT
PACK COLON (CUSTOM PROCEDURE TRAY) ×2 IMPLANT
PAD POSITIONING PINK XL (MISCELLANEOUS) ×1 IMPLANT
PENCIL SMOKE EVACUATOR (MISCELLANEOUS) IMPLANT
PROTECTOR NERVE ULNAR (MISCELLANEOUS) ×1 IMPLANT
RELOAD PROXIMATE 75MM BLUE (ENDOMECHANICALS) ×4 IMPLANT
RELOAD STAPLE 75 3.8 BLU REG (ENDOMECHANICALS) IMPLANT
RETRACTOR WND ALEXIS 18 MED (MISCELLANEOUS) IMPLANT
RTRCTR WOUND ALEXIS 18CM MED (MISCELLANEOUS)
SCISSORS LAP 5X35 DISP (ENDOMECHANICALS) ×2 IMPLANT
SEALER TISSUE G2 STRG ARTC 35C (ENDOMECHANICALS) IMPLANT
SET TUBE SMOKE EVAC HIGH FLOW (TUBING) ×2 IMPLANT
SHEARS HARMONIC ACE PLUS 36CM (ENDOMECHANICALS) IMPLANT
SLEEVE ADV FIXATION 5X100MM (TROCAR) ×4 IMPLANT
SLEEVE ENDOPATH XCEL 5M (ENDOMECHANICALS) ×2 IMPLANT
SPIKE FLUID TRANSFER (MISCELLANEOUS) ×2 IMPLANT
STAPLER 90 3.5 STAND SLIM (STAPLE) ×2
STAPLER 90 3.5 STD SLIM (STAPLE) IMPLANT
STAPLER GUN LINEAR PROX 60 (STAPLE) IMPLANT
STAPLER PROXIMATE 75MM BLUE (STAPLE) ×1 IMPLANT
STAPLER VISISTAT 35W (STAPLE) ×1 IMPLANT
SUT MNCRL AB 4-0 PS2 18 (SUTURE) ×3 IMPLANT
SUT PDS AB 1 CT1 27 (SUTURE) ×4 IMPLANT
SUT PROLENE 2 0 CT2 30 (SUTURE) IMPLANT
SUT PROLENE 2 0 KS (SUTURE) IMPLANT
SUT SILK 2 0 (SUTURE) ×1
SUT SILK 2 0 SH CR/8 (SUTURE) ×2 IMPLANT
SUT SILK 2-0 18XBRD TIE 12 (SUTURE) ×1 IMPLANT
SUT SILK 3 0 (SUTURE) ×1
SUT SILK 3 0 SH CR/8 (SUTURE) ×2 IMPLANT
SUT SILK 3-0 18XBRD TIE 12 (SUTURE) ×1 IMPLANT
SYS LAPSCP GELPORT 120MM (MISCELLANEOUS)
SYS WOUND ALEXIS 18CM MED (MISCELLANEOUS) ×2
SYSTEM LAPSCP GELPORT 120MM (MISCELLANEOUS) IMPLANT
SYSTEM WOUND ALEXIS 18CM MED (MISCELLANEOUS) ×1 IMPLANT
TAPE CLOTH 4X10 WHT NS (GAUZE/BANDAGES/DRESSINGS) IMPLANT
TOWEL OR 17X26 10 PK STRL BLUE (TOWEL DISPOSABLE) ×2 IMPLANT
TRAY FOLEY MTR SLVR 14FR STAT (SET/KITS/TRAYS/PACK) ×1 IMPLANT
TRAY FOLEY MTR SLVR 16FR STAT (SET/KITS/TRAYS/PACK) ×1 IMPLANT
TROCAR ADV FIXATION 5X100MM (TROCAR) ×2 IMPLANT
TROCAR BALLN 12MMX100 BLUNT (TROCAR) ×2 IMPLANT
TROCAR XCEL 12X100 BLDLESS (ENDOMECHANICALS) ×2 IMPLANT
TROCAR XCEL NON-BLD 11X100MML (ENDOMECHANICALS) IMPLANT
TUBING CONNECTING 10 (TUBING) ×4 IMPLANT
YANKAUER SUCT BULB TIP NO VENT (SUCTIONS) ×2 IMPLANT

## 2021-12-20 NOTE — H&P (Signed)
? ?CC: Here today for surgery ? ?HPI: ?Shawn Perez is an 56 y.o. male with history of pre-DM, HTN, GERD whom is seen in the office today as a referral by Dr. Bryan Lemma for evaluation of colon polyps. ? ?Cscope with Dr. Bryan Lemma 10/24/21 - endoscopically unresectable polyp in cecum; 2 additional polyps in the right colon- the last/most distal polyp being the location marked with a tattoo 3 cm distal to it. Pathology on the larger polypoid mass in the cecum returned as tubular adenoma. 3 other polyps were removed that returned as tubular adenomas as well. All biopsies did not demonstrate any high-grade dysplasia or carcinoma. He is scheduled for CT scans. ? ?He denies any complaints today. He reports he had a colonoscopy at the age of 29 for a "ulcer". ? ?He denies any changes in his health or health history since we met in the office. States he is ready for surgery. Tolerated prep with satisfactory result. ? ? ?PMH: HTN, GERD, pre-DM ? ?PSH: Laparoscopic umb hernia repair with mesh 2019. Denies any other abdominal surgical history ? ?FHx: He has 1 brother with testicular cancer, 1 brother with esophageal/gastric cancer. His father had prostate cancer. Denies any other known family history of colorectal, breast, endometrial or ovarian cancer ? ?Social Hx: Denies use of tobacco/drugs; previously drank 2-4 beers per day. He reports that for the last 2 to 3 weeks he has had no alcohol intake. He is here today with his wife. 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 ? ?Past Medical History:  ?Diagnosis Date  ? Anxiety   ? Diabetes mellitus without complication ([place])   ? type 2  ? Family history of adverse reaction to anesthesia   ? Shawn Perez has trouble waking up both times under anesthesia diagnosed with sleep apnea  ? GERD (gastroesophageal reflux disease)   ? "had 4-5 years ago"  ? Heart murmur   ? since birth  ? Hyperlipidemia 2011  ? Hypertension   ? Sleep apnea   ? wears cpap   ? Temporomandibular jaw dysfunction   ? ? ?Past Surgical History:  ?Procedure Laterality Date  ? COLONOSCOPY  1983  ? COLONOSCOPY WITH PROPOFOL  10/24/2021  ? INGUINAL HERNIA REPAIR N/A 04/09/2018  ? Procedure: LAPAROSCOPIC RIGHT INGUINAL AND BILATERAL FEMORAL HERNIA REPAIR;  Surgeon: Michael Boston, MD;  Location: [facility];  Service: General;  Laterality: N/A;  ? INSERTION OF MESH N/A 04/09/2018  ? Procedure: INSERTION OF MESH;  Surgeon: Michael Boston, MD;  Location: [facility];  Service: General;  Laterality: N/A;  ? NO PAST SURGERIES  07/09/2011  ? Denies surgical history  ? WISDOM TOOTH EXTRACTION    ? ? ?Family History  ?Problem Relation Age of Onset  ? Hypertension Mother   ? Diabetes Mother   ? Early death Mother   ? Heart disease Mother   ? Colon cancer Father   ? Heart disease Sister   ? Early death Brother   ? Heart disease Brother   ? Hyperlipidemia Brother   ? Hypertension Brother   ? Stomach cancer Brother   ? Heart disease Brother   ? Early death Maternal Aunt   ? Heart disease Maternal Aunt   ? Early death Maternal Uncle   ? Heart disease Maternal Uncle   ? Heart disease Maternal Grandmother   ? Early death Maternal Grandmother   ? Esophageal cancer Maternal Grandfather   ? Rectal cancer Neg Hx   ? ? ?Social:  reports that he quit smoking about 23 years ago. His smoking use included cigarettes. He has a 17.00 pack-year smoking history. He has been exposed to tobacco smoke. He has never used smokeless tobacco. He reports that he does not currently use alcohol after a past usage of about 7.0 standard drinks per week. He reports that he does not use drugs. ? ?Allergies:  ?Allergies  ?Allergen Reactions  ? Fenofibrate Rash  ? Penicillins Rash  ?  Childhood allergy ?Has patient had a PCN reaction causing immediate rash, facial/tongue/throat swelling, SOB or lightheadedness with hypotension: Yes ?Has patient had a PCN reaction causing severe rash involving mucus membranes or skin necrosis: No ?Has patient had a  PCN reaction that required hospitalization: No ?Has patient had a PCN reaction occurring within the last 10 years: No ?If all of the above answers are "NO", then may proceed with Cephalosporin use. ?  ? ? ?Medications: I have reviewed the patient's current medications. ? ?No results found for this or any previous visit (from the past 48 hour(s)). ? ?No results found. ? ?ROS - all of the below systems have been reviewed with the patient and positives are indicated with bold text ?General: chills, fever or night sweats ?Eyes: blurry vision or double vision ?ENT: epistaxis or sore throat ?Allergy/Immunology: itchy/watery eyes or nasal congestion ?Hematologic/Lymphatic: bleeding problems, blood clots or swollen lymph nodes ?Endocrine: temperature intolerance or unexpected weight changes ?Breast: new or changing breast lumps or nipple discharge ?Resp: cough, shortness of breath, or wheezing ?CV: chest pain or dyspnea on exertion ?GI: as per HPI ?GU: dysuria, trouble voiding, or hematuria ?MSK: joint pain or joint stiffness ?Neuro: TIA or stroke symptoms ?Derm: pruritus and skin lesion changes ?Psych: anxiety and depression ? ?PE ?There were no vitals taken for this visit. ?Constitutional: NAD; conversant ?Eyes: Moist conjunctiva; no lid lag; anicteric ?Lungs: Normal respiratory effort ?CV: RRR; no pitting edema ?GI: Abd soft, NT/ND; no palpable hepatosplenomegaly ?MSK: Normal range of motion of extremities ?Psychiatric: Appropriate affect; alert and oriented x3 ? ?No results found for this or any previous visit (from the past 48 hour(s)). ? ?No results found. ? ? ? ?A/P: ?Shawn Perez is an 56 y.o. male with hx of HTN, HLD, pre-dm here for evaluation of endoscopically unresectable polyp of the cecum and additionally to adenomatous polyps in the ascending colon. Most distal marked 3 cm distal to it. ? ?-The anatomy and physiology of the GI tract was reviewed with the patient. The pathophysiology of colon polyps and  cancer was discussed as well with associated pictures. ?-We have discussed various different treatment options going forward including surgery (the most definitive) to address this - laparoscopic right hemicolectomy ?-The planned procedure, material risks (including, but not limited to, pain, bleeding, infection, scarring, need for blood transfusion, damage to surrounding structures- blood vessels/nerves/viscus/organs, damage to ureter, urine leak, leak from anastomosis, need for additional procedures, scenarios where a stoma may be necessary and where it may be permanent, worsening of pre-existing medical conditions, hernia, recurrence, pneumonia, heart attack, stroke, death) benefits and alternatives to surgery were discussed at length. The patient's questions were answered to his satisfaction, he voiced understanding and elected to proceed with surgery. Additionally, we discussed typical postoperative expectations and the recovery process. ? ?Nadeen Landau, MD FACS ?[facility] ? ?

## 2021-12-20 NOTE — Anesthesia Procedure Notes (Signed)
Procedure Name: Intubation ?Date/Time: 12/20/2021 11:02 AM ?Performed by: Lavina Hamman, CRNA ?Pre-anesthesia Checklist: Patient identified, Emergency Drugs available, Suction available and Patient being monitored ?Patient Re-evaluated:Patient Re-evaluated prior to induction ?Oxygen Delivery Method: Circle System Utilized ?Preoxygenation: Pre-oxygenation with 100% oxygen ?Induction Type: IV induction ?Ventilation: Mask ventilation without difficulty ?Laryngoscope Size: Glidescope and 4 ?Grade View: Grade I ?Tube type: Oral ?Tube size: 7.5 mm ?Number of attempts: 1 ?Airway Equipment and Method: Oral airway and Rigid stylet ?Placement Confirmation: ETT inserted through vocal cords under direct vision, positive ETCO2 and breath sounds checked- equal and bilateral ?Secured at: 23 cm ?Tube secured with: Tape ?Dental Injury: Teeth and Oropharynx as per pre-operative assessment  ? ? ? ? ?

## 2021-12-20 NOTE — Op Note (Signed)
PATIENT: Shawn Perez  56 y.o. male ? ?Patient Care Team: ?Shelda Pal, DO as PCP - General (Family Medicine) ?Michael Boston, MD as Consulting Physician (General Surgery) ?McKenzie, Candee Furbish, MD as Consulting Physician (Urology) ? ?PREOP DIAGNOSIS: Colon polyps ? ?POSTOP DIAGNOSIS: Same ? ?PROCEDURE: Laparoscopic right hemicolectomy ? ?SURGEON: Sharon Mt. Elohim Brune, MD ? ?ASSISTANT: Verita Lamb, MD ? ?ANESTHESIA: General endotracheal ? ?EBL: 25 mL ?Total I/O ?In: 1000 [I.V.:1000] ?Out: 230 [Urine:200; Blood:30] ? ?DRAINS: None ? ?SPECIMEN: Right colon (including terminal ileum, cecum, ascending colon, proximal transverse colon) ? ?COUNTS: Sponge, needle and instrument counts were reported correct x2 ? ?FINDINGS:  ?Tattoo in ascending colon. No full thickness extension apparent. Some adhesions in RLQ related to his prior hernia repairs. A right hemicolectomy was carried out including the right branch of the middle colic vessel. ? ?NARRATIVE:  ?The patient was identified & brought into the operating room, placed supine on the operating table and SCDs were applied to the lower extremities. General endotracheal anesthesia was induced. The patient was positioned supine with arms tucked. Antibiotics were administered. A foley catheter was placed under sterile conditions. Hair in the region of planned surgery was clipped. The abdomen was prepped and draped in a sterile fashion. A timeout was performed confirming our patient and plan.  ? ?Beginning with the extraction port, a supraumbilical incision was made and carried down to the midline fascia. This was then incised with electrocautery. The peritoneum was identified and elevated between clamps and carefully opened sharply. A small Alexis wound protector with a cap and associated port was then placed. The abdomen was insufflated to 15 mmHg with CO2. A laparoscope was placed and camera inspection revealed no evidence of injury. Bilateral TAP blocks were then  performed under laparoscopic visualization using a mixture of 0.25% marcaine with epinepherine + Exparel. 3 additional ports were then placed under direct laparoscopic visualization - two in the left hemiabdomen and one in the right abdomen. The abdomen was surveyed, tattoo was found in the ascending colon. There were no signs of metastatic disease. ? ?He was positioned in trendelenburg with left side down. The ileocolic pedicle was identified. Gentle blunt dissection commenced around the pedicle and the duodenum was identified and freed from the surrounding structures. We then separated the structures bluntly and used the Enseal device to transect these separately.  We developed the retroperitoneal plane bluntly.  We then freed the appendix off its attachments to the pelvic wall. I mobilized the terminal ileum.  We took care to avoid injuring any retroperitoneal structures.  After this I began to mobilize laterally down the Orest Dygert line of [place] and then took down the hepatic flexure down using the Enseal device. Omentum was then freed off of the right portion of the transverse colon. The entire colon was then flipped medially and mobilized off of the retroperitoneal structures until I could visualize the lateral edge of the duodenum underneath ? ?At this point, the abdomen was desufflated and the terminal ileum and right colon delivered through the wound protector. The terminal ileum was then transected using a GIA blue load stapler. The remaining mesentery was divided using the Enseal device. The divided mesentery was inspected and noted to be hemostatic. The distal point of transection was then identified on the transverse colon at a location that included the right branch of the middle colics, leaving the main middle colic feeding the remaining transverse colon. This was transected using another blue load GIA stapler.  The specimen was  then passed off. Attention was turned to creating the anastomosis. The  terminal ileum and transverse colon were inspected for orientation to ensure no twisting nor bowel included in the mesenteric defect. An anastomosis was created between the terminal ileum and the transverse colon using a 75 mm GIA blue load stapler. The staple line was inspected and noted to be hemostatic.  The common enterotomy channel was closed using a TA 90 blue load stapler. Hemostasis was achieved at the staple line using 3-0 silk U-stitches. 2-0 silk sutures were used to imbricate the corners of the staple line as well.  A 2-0 silk suture was placed securing the apex of the anastomosis. The anastomosis was palpated and noted to be widely patent. This was then placed back into the abdomen. The abdomen was then irrigated with sterile saline and hemostasis verified. The omentum was then brought down over the anastomosis. The wound protector cap was replaced and Co2 reinsufflated. The laparoscopic ports were removed under direct visualization and the sites noted to be hemostatic. The Alexis wound protector was removed, counts were reported correct, and we switched to clean instruments, gowns and drapes.  ? ?The fascia was then closed using two running #1 PDS sutures.  The skin of all incision sites was closed with 4-0 monocryl subcuticular suture. Dermabond was placed on the port sites and a sterile dressing was placed over the abdominal incision. All counts were reported correct. The patient was then awakened from anesthesia and sent to the post anesthesia care unit in stable condition.  ? ? ?DISPOSITION: PACU in satisfactory condition ?

## 2021-12-20 NOTE — Progress Notes (Signed)
Pt not ambulated this evening bc of nausea/vomiting. Prn med given and will attempt later. ?

## 2021-12-20 NOTE — Anesthesia Preprocedure Evaluation (Addendum)
Anesthesia Evaluation  ?Patient identified by MRN, date of birth, ID band ?Patient awake ? ? ? ?Reviewed: ?Allergy & Precautions, NPO status , Patient's Chart, lab work & pertinent test results ? ?History of Anesthesia Complications ?Negative for: history of anesthetic complications ? ?Airway ?Mallampati: II ? ?TM Distance: >3 FB ?Neck ROM: Full ? ? ? Dental ? ?(+) Chipped,  ?  ?Pulmonary ?sleep apnea , former smoker,  ?  ?Pulmonary exam normal ? ? ? ? ? ? ? Cardiovascular ?hypertension, Pt. on medications ?Normal cardiovascular exam ? ? ?  ?Neuro/Psych ?Anxiety negative neurological ROS ?   ? GI/Hepatic ?Neg liver ROS, GERD  Controlled,  ?Endo/Other  ?diabetes, Type 2, Oral Hypoglycemic Agents ? Renal/GU ?negative Renal ROS  ?negative genitourinary ?  ?Musculoskeletal ?negative musculoskeletal ROS ?(+)  ? Abdominal ?  ?Peds ? Hematology ?negative hematology ROS ?(+)   ?Anesthesia Other Findings ?Day of surgery medications reviewed with patient. ? Reproductive/Obstetrics ?negative OB ROS ? ?  ? ? ? ? ? ? ? ? ? ? ? ? ? ?  ?  ? ? ? ? ? ? ? ?Anesthesia Physical ?Anesthesia Plan ? ?ASA: 2 ? ?Anesthesia Plan: General  ? ?Post-op Pain Management: Tylenol PO (pre-op)* and Ketamine IV*  ? ?Induction: Intravenous ? ?PONV Risk Score and Plan: 3 and Treatment may vary due to age or medical condition, Midazolam, Dexamethasone and Ondansetron ? ?Airway Management Planned: Oral ETT ? ?Additional Equipment: None ? ?Intra-op Plan:  ? ?Post-operative Plan: Extubation in OR ? ?Informed Consent: I have reviewed the patients History and Physical, chart, labs and discussed the procedure including the risks, benefits and alternatives for the proposed anesthesia with the patient or authorized representative who has indicated his/her understanding and acceptance.  ? ? ? ?Dental advisory given ? ?Plan Discussed with: CRNA ? ?Anesthesia Plan Comments:   ? ? ? ? ? ?Anesthesia Quick Evaluation ? ?

## 2021-12-20 NOTE — Transfer of Care (Signed)
Immediate Anesthesia Transfer of Care Note ? ?Patient: Shawn Perez ? ?Procedure(s) Performed: Procedure(s): ?LAPAROSCOPIC RIGHT HEMI COLECTOMY (Right) ? ?Patient Location: PACU ? ?Anesthesia Type:General ? ?Level of Consciousness:  sedated, patient cooperative and responds to stimulation ? ?Airway & Oxygen Therapy:Patient Spontanous Breathing and Patient connected to face mask oxgen ? ?Post-op Assessment:  Report given to PACU RN and Post -op Vital signs reviewed and stable ? ?Post vital signs:  Reviewed and stable ? ?Last Vitals:  ?Vitals:  ? 12/20/21 0845 12/20/21 1245  ?BP: 132/81   ?Pulse: 77   ?Resp: 16   ?Temp: 36.8 ?C 36.6 ?C  ?SpO2: 96%   ? ? ?Complications: No apparent anesthesia complications ? ?

## 2021-12-20 NOTE — Anesthesia Postprocedure Evaluation (Signed)
Anesthesia Post Note ? ?Patient: DESTEN MANOR ? ?Procedure(s) Performed: LAPAROSCOPIC RIGHT HEMI COLECTOMY (Right) ? ?  ? ?Patient location during evaluation: PACU ?Anesthesia Type: General ?Level of consciousness: awake and alert ?Pain management: pain level controlled ?Vital Signs Assessment: post-procedure vital signs reviewed and stable ?Respiratory status: spontaneous breathing, nonlabored ventilation and respiratory function stable ?Cardiovascular status: blood pressure returned to baseline ?Postop Assessment: no apparent nausea or vomiting ?Anesthetic complications: no ? ? ?No notable events documented. ? ?Last Vitals:  ?Vitals:  ? 12/20/21 1315 12/20/21 1330  ?BP: 137/80 122/78  ?Pulse: 76 75  ?Resp: 16 13  ?Temp:    ?SpO2: 100% 98%  ?  ?Last Pain:  ?Vitals:  ? 12/20/21 1330  ?TempSrc:   ?PainSc: 5   ? ? ?  ?  ?  ?  ?  ?  ? ?Marthenia Rolling ? ? ? ? ?

## 2021-12-21 ENCOUNTER — Encounter (HOSPITAL_COMMUNITY): Payer: Self-pay | Admitting: Surgery

## 2021-12-21 LAB — CBC
HCT: 43.9 % (ref 39.0–52.0)
Hemoglobin: 14.9 g/dL (ref 13.0–17.0)
MCH: 32.4 pg (ref 26.0–34.0)
MCHC: 33.9 g/dL (ref 30.0–36.0)
MCV: 95.4 fL (ref 80.0–100.0)
Platelets: 236 10*3/uL (ref 150–400)
RBC: 4.6 MIL/uL (ref 4.22–5.81)
RDW: 12.1 % (ref 11.5–15.5)
WBC: 11.7 10*3/uL — ABNORMAL HIGH (ref 4.0–10.5)
nRBC: 0 % (ref 0.0–0.2)

## 2021-12-21 LAB — GLUCOSE, CAPILLARY
Glucose-Capillary: 200 mg/dL — ABNORMAL HIGH (ref 70–99)
Glucose-Capillary: 114 mg/dL — ABNORMAL HIGH (ref 70–99)
Glucose-Capillary: 149 mg/dL — ABNORMAL HIGH (ref 70–99)
Glucose-Capillary: 137 mg/dL — ABNORMAL HIGH (ref 70–99)

## 2021-12-21 LAB — BASIC METABOLIC PANEL
Anion gap: 9 (ref 5–15)
BUN: 14 mg/dL (ref 6–20)
CO2: 25 mmol/L (ref 22–32)
Calcium: 8.8 mg/dL — ABNORMAL LOW (ref 8.9–10.3)
Chloride: 103 mmol/L (ref 98–111)
Creatinine, Ser: 0.98 mg/dL (ref 0.61–1.24)
GFR, Estimated: >60 mL/min (ref >60)
Glucose, Bld: 142 mg/dL — ABNORMAL HIGH (ref 70–99)
Potassium: 5 mmol/L (ref 3.5–5.1)
Sodium: 137 mmol/L (ref 135–145)

## 2021-12-21 MED ORDER — FERROUS SULFATE 325 (65 FE) MG PO TABS
325.0000 mg | ORAL_TABLET | Freq: Two times a day (BID) | ORAL | Status: DC
  Administered 2021-12-22 – 2021-12-23 (×3): 325 mg via ORAL
  Filled 2021-12-21 (×3): qty 1

## 2021-12-21 MED ORDER — LACTATED RINGERS IV BOLUS
1000.0000 mL | Freq: Three times a day (TID) | INTRAVENOUS | Status: DC | PRN
Start: 2021-12-21 — End: 2021-12-23

## 2021-12-21 MED ORDER — SODIUM CHLORIDE 0.9 % IV SOLN: Freq: Once | INTRAVENOUS | Status: AC

## 2021-12-21 NOTE — Progress Notes (Signed)
?  Subjective ?No acute events. Some nausea last night, none since. Tolerating eggs, ginger ale ? ?Objective: ?Vital signs in last 24 hours: ?Temp:  [97.4 ?F (36.3 ?C)-98.2 ?F (36.8 ?C)] 98.2 ?F (36.8 ?C) (04/27 0532) ?Pulse Rate:  [64-83] 64 (04/27 0532) ?Resp:  [10-18] 18 (04/27 0532) ?BP: (112-155)/(68-84) 145/74 (04/27 0532) ?SpO2:  [93 %-100 %] 97 % (04/27 0532) ?Weight:  [76.4 kg] 76.4 kg (04/26 1700) ?Last BM Date : 12/20/21 ? ?Intake/Output from previous day: ?04/26 0701 - 04/27 0700 ?In: 3377.5 [P.O.:720; I.V.:2657.5] ?Out: 1955 [Urine:1725; Emesis/NG output:200; Blood:30] ?Intake/Output this shift: ?No intake/output data recorded. ? ?Gen: NAD, comfortable ?CV: RRR ?Pulm: Normal work of breathing ?Abd: Soft, ?mildly distended, nontender. Incisions c/d/I without erythema or drainage ?Ext: SCDs in place ? ?Lab Results: ?CBC  ?Recent Labs  ?  12/21/21 ?0358  ?WBC 11.7*  ?HGB 14.9  ?HCT 43.9  ?PLT 236  ? ?BMET ?Recent Labs  ?  12/21/21 ?0358  ?NA 137  ?K 5.0  ?CL 103  ?CO2 25  ?GLUCOSE 142*  ?BUN 14  ?CREATININE 0.98  ?CALCIUM 8.8*  ? ?PT/INR ?No results for input(s): LABPROT, INR in the last 72 hours. ?ABG ?No results for input(s): PHART, HCO3 in the last 72 hours. ? ?Invalid input(s): PCO2, PO2 ? ?Studies/Results: ? ?Anti-infectives: ?Anti-infectives (From admission, onward)  ? ? Start     Dose/Rate Route Frequency Ordered Stop  ? 12/20/21 0845  vancomycin (VANCOCIN) IVPB 1000 mg/200 mL premix       ?See Hyperspace for full Linked Orders Report.  ? 1,000 mg ?200 mL/hr over 60 Minutes Intravenous  Once 12/20/21 0838 12/20/21 1102  ? 12/20/21 0838  gentamicin (GARAMYCIN) 400 mg in dextrose 5 % 100 mL IVPB       ?See Hyperspace for full Linked Orders Report.  ? 5 mg/kg ? 79.1 kg ?110 mL/hr over 60 Minutes Intravenous 60 min pre-op 12/20/21 0301 12/20/21 1221  ? ?  ? ? ? ?Assessment/Plan: ?Patient Active Problem List  ? Diagnosis Date Noted  ? S/P right hemicolectomy 12/20/2021  ? Bilateral femoral hernias s/p lap  repair w mesh 04/09/2018 04/09/2018  ? Right inguinal hernia s/p lap repair w mesh 04/09/2018 04/09/2018  ? Overweight (BMI 25.0-29.9) 08/08/2017  ? B12 deficiency 05/02/2016  ? Generalized anxiety disorder 05/02/2016  ? Type 2 diabetes mellitus with hyperglycemia, without long-term current use of insulin ([place]) 05/02/2016  ? Chronic right-sided low back pain without sciatica 05/19/2015  ? Sleep apnea 03/02/2015  ? Vitamin D deficiency 07/02/2014  ? Elevated platelet count 07/02/2014  ? Family history of premature CAD 04/03/2014  ? Open-angle glaucoma 09/02/2013  ? Hyperlipidemia LDL goal <70 07/22/2013  ? Anxiety 03/14/2012  ? GERD (gastroesophageal reflux disease) 03/14/2012  ? Essential hypertension 07/11/2011  ? ?s/p Procedure(s): ?LAPAROSCOPIC RIGHT HEMI COLECTOMY 12/20/2021 ? ?-Diet as tolerated - if nauseated/bloated discussed holding off on anything more, otherwise as tolerated ?-Ambulate 5x/day ?-Awaiting return of bowel fxn, continue entereg ?-Ppx: SQH, SCDs ? LOS: 1 day  ? ?Nadeen Landau, MD FACS ?[facility] ?

## 2021-12-21 NOTE — Progress Notes (Signed)
?  Transition of Care (TOC) Screening Note ? ? ?Patient Details  ?Name: Shawn Perez ?Date of Birth: 1965-11-29 ? ? ?Transition of Care (TOC) CM/SW Contact:    ?Gloriajean Okun, LCSW ?Phone Number: ?12/21/2021, 10:26 AM ? ? ? ?Transition of Care Department [facility]) has reviewed patient and no TOC needs have been identified at this time. We will continue to monitor patient advancement through interdisciplinary progression rounds. If new patient transition needs arise, please place a TOC consult. ? ? ?

## 2021-12-21 NOTE — Progress Notes (Signed)
Pt had 4 bloody stools since 1830. No weakness or dizziness. Previous BP 126/75, P 71 and Hg this Am was 14.9. Dr Johney Maine notified and order to give 1li NS bolus, hold blood thinner and CBC tom. Am. Will carryout orders and continue to monitor pt. ?

## 2021-12-22 ENCOUNTER — Other Ambulatory Visit (HOSPITAL_BASED_OUTPATIENT_CLINIC_OR_DEPARTMENT_OTHER): Payer: Self-pay

## 2021-12-22 LAB — BASIC METABOLIC PANEL
Anion gap: 7 (ref 5–15)
BUN: 18 mg/dL (ref 6–20)
CO2: 27 mmol/L (ref 22–32)
Calcium: 8.4 mg/dL — ABNORMAL LOW (ref 8.9–10.3)
Chloride: 105 mmol/L (ref 98–111)
Creatinine, Ser: 0.8 mg/dL (ref 0.61–1.24)
GFR, Estimated: >60 mL/min (ref >60)
Glucose, Bld: 154 mg/dL — ABNORMAL HIGH (ref 70–99)
Potassium: 4.3 mmol/L (ref 3.5–5.1)
Sodium: 139 mmol/L (ref 135–145)

## 2021-12-22 LAB — GLUCOSE, CAPILLARY
Glucose-Capillary: 134 mg/dL — ABNORMAL HIGH (ref 70–99)
Glucose-Capillary: 220 mg/dL — ABNORMAL HIGH (ref 70–99)
Glucose-Capillary: 82 mg/dL (ref 70–99)
Glucose-Capillary: 113 mg/dL — ABNORMAL HIGH (ref 70–99)

## 2021-12-22 LAB — CBC
HCT: 39.4 % (ref 39.0–52.0)
Hemoglobin: 13 g/dL (ref 13.0–17.0)
MCH: 32.2 pg (ref 26.0–34.0)
MCHC: 33 g/dL (ref 30.0–36.0)
MCV: 97.5 fL (ref 80.0–100.0)
Platelets: 219 10*3/uL (ref 150–400)
RBC: 4.04 MIL/uL — ABNORMAL LOW (ref 4.22–5.81)
RDW: 12.4 % (ref 11.5–15.5)
WBC: 11.5 10*3/uL — ABNORMAL HIGH (ref 4.0–10.5)
nRBC: 0 % (ref 0.0–0.2)

## 2021-12-22 LAB — HEMOGLOBIN AND HEMATOCRIT, BLOOD
HCT: 37.6 % — ABNORMAL LOW (ref 39.0–52.0)
Hemoglobin: 12.9 g/dL — ABNORMAL LOW (ref 13.0–17.0)

## 2021-12-22 LAB — SURGICAL PATHOLOGY

## 2021-12-22 MED ORDER — TRAMADOL HCL 50 MG PO TABS
50.0000 mg | ORAL_TABLET | Freq: Four times a day (QID) | ORAL | 0 refills | Status: DC | PRN
  Filled 2021-12-22: qty 15, 4d supply, fill #0

## 2021-12-22 MED ORDER — EMPAGLIFLOZIN 25 MG PO TABS
25.0000 mg | ORAL_TABLET | Freq: Every day | ORAL | Status: DC
Start: 2021-12-22 — End: 2021-12-23
  Administered 2021-12-22 – 2021-12-23 (×2): 25 mg via ORAL
  Filled 2021-12-22 (×2): qty 1

## 2021-12-22 MED ORDER — METFORMIN HCL 500 MG PO TABS
1000.0000 mg | ORAL_TABLET | Freq: Two times a day (BID) | ORAL | Status: DC
  Administered 2021-12-23: 1000 mg via ORAL
  Filled 2021-12-22 (×2): qty 2

## 2021-12-22 MED ORDER — PIOGLITAZONE HCL 30 MG PO TABS
30.0000 mg | ORAL_TABLET | Freq: Every day | ORAL | Status: DC
  Administered 2021-12-22 – 2021-12-23 (×2): 30 mg via ORAL
  Filled 2021-12-22 (×2): qty 1

## 2021-12-22 NOTE — Discharge Instructions (Signed)
POST OP INSTRUCTIONS AFTER COLON SURGERY  DIET: Be sure to include lots of fluids daily to stay hydrated - 64oz of water per day (8, 8 oz glasses).  Avoid fast food or heavy meals for the first couple of weeks as your are more likely to get nauseated. Avoid raw/uncooked fruits or vegetables for the first 4 weeks (its ok to have these if they are blended into smoothie form). If you have fruits/vegetables, make sure they are cooked until soft enough to mash on the roof of your mouth and chew your food well. Otherwise, diet as tolerated.  Take your usually prescribed home medications unless otherwise directed.  PAIN CONTROL: Pain is best controlled by a usual combination of three different methods TOGETHER: Ice/Heat Over the counter pain medication Prescription pain medication Most patients will experience some swelling and bruising around the surgical site.  Ice packs or heating pads (30-60 minutes up to 6 times a day) will help. Some people prefer to use ice alone, heat alone, alternating between ice & heat.  Experiment to what works for you.  Swelling and bruising can take several weeks to resolve.   It is helpful to take an over-the-counter pain medication regularly for the first few weeks: Ibuprofen (Motrin/Advil) - 200mg tabs - take 3 tabs (600mg) every 6 hours as needed for pain (unless you have been directed previously to avoid NSAIDs/ibuprofen) Acetaminophen (Tylenol) - you may take 650mg every 6 hours as needed. You can take this with motrin as they act differently on the body. If you are taking a narcotic pain medication that has acetaminophen in it, do not take over the counter tylenol at the same time. NOTE: You may take both of these medications together - most patients  find it most helpful when alternating between the two (i.e. Ibuprofen at 6am, tylenol at 9am, ibuprofen at 12pm ...) A  prescription for pain medication should be given to you upon discharge.  Take your pain medication as  prescribed if your pain is not adequatly controlled with the over-the-counter pain reliefs mentioned above.  Avoid getting constipated.  Between the surgery and the pain medications, it is common to experience some constipation.  Increasing fluid intake and taking a fiber supplement (such as Metamucil, Citrucel, FiberCon, MiraLax, etc) 1-2 times a day regularly will usually help prevent this problem from occurring.  A mild laxative (prune juice, Milk of Magnesia, MiraLax, etc) should be taken according to package directions if there are no bowel movements after 48 hours.    Dressing: Your incisions are covered in Dermabond which is like sterile superglue for the skin. This will come off on it's own in a couple weeks. It is waterproof and you may bathe normally starting the day after your surgery in a shower. Avoid baths/pools/lakes/oceans until your wounds have fully healed.  ACTIVITIES as tolerated:   Avoid heavy lifting (>10lbs or 1 gallon of milk) for the next 6 weeks. You may resume regular daily activities as tolerated--such as daily self-care, walking, climbing stairs--gradually increasing activities as tolerated.  If you can walk 30 minutes without difficulty, it is safe to try more intense activity such as jogging, treadmill, bicycling, low-impact aerobics.  DO NOT PUSH THROUGH PAIN.  Let pain be your guide: If it hurts to do something, don't do it. You may drive when you are no longer taking prescription pain medication, you can comfortably wear a seatbelt, and you can safely maneuver your car and apply brakes.  FOLLOW UP in our   office Please call [facility] at [phone] to set up an appointment to see your surgeon in the office for a follow-up appointment approximately 2 weeks after your surgery. Make sure that you call for this appointment the day you arrive home to insure a convenient appointment time.  9. If you have disability or family leave forms that need to be completed, you may have  them completed by your primary care physician's office; for return to work instructions, please ask our office staff and they will be happy to assist you in obtaining this documentation   When to call us [phone]: Poor pain control Reactions / problems with new medications (rash/itching, etc)  Fever over 101.5 F (38.5 C) Inability to urinate Nausea/vomiting Worsening swelling or bruising Continued bleeding from incision. Increased pain, redness, or drainage from the incision  The clinic staff is available to answer your questions during regular business hours (8:30am-5pm).  Please don't hesitate to call and ask to speak to one of our nurses for clinical concerns.   A surgeon from [facility] is always on call at the hospitals   If you have a medical emergency, go to the nearest emergency room or call 911.  [facility] [address], [address], [place], [place]  [address] MAIN: [phone] FAX: [phone] [email or web address]  

## 2021-12-22 NOTE — Progress Notes (Signed)
?  Subjective ?No nausea or vomiting. Tolerating regular diet without trouble. Having flatus and overnight bloody BMs ? ?Objective: ?Vital signs in last 24 hours: ?Temp:  [97.4 ?F (36.3 ?C)-98.6 ?F (37 ?C)] 98.6 ?F (37 ?C) (04/28 0519) ?Pulse Rate:  [66-85] 85 (04/28 0519) ?Resp:  [14-18] 16 (04/28 0519) ?BP: (115-144)/(54-75) 118/69 (04/28 0519) ?SpO2:  [95 %-97 %] 96 % (04/28 0519) ?Weight:  [78.4 kg] 78.4 kg (04/28 0500) ?Last BM Date : 12/21/21 ? ?Intake/Output from previous day: ?04/27 0701 - 04/28 0700 ?In: 1080 [P.O.:1080] ?Out: 600 [Urine:600] ?Intake/Output this shift: ?No intake/output data recorded. ? ?Gen: NAD, comfortable ?CV: RRR ?Pulm: Normal work of breathing ?Abd: Soft, nondistended, nontender. Incisions c/d/I without erythema or drainage ?Ext: SCDs in place ? ?Lab Results: ?CBC  ?Recent Labs  ?  12/21/21 ?0358 12/22/21 ?0405  ?WBC 11.7* 11.5*  ?HGB 14.9 13.0  ?HCT 43.9 39.4  ?PLT 236 219  ? ?BMET ?Recent Labs  ?  12/21/21 ?0358 12/22/21 ?0405  ?NA 137 139  ?K 5.0 4.3  ?CL 103 105  ?CO2 25 27  ?GLUCOSE 142* 154*  ?BUN 14 18  ?CREATININE 0.98 0.80  ?CALCIUM 8.8* 8.4*  ? ?PT/INR ?No results for input(s): LABPROT, INR in the last 72 hours. ?ABG ?No results for input(s): PHART, HCO3 in the last 72 hours. ? ?Invalid input(s): PCO2, PO2 ? ?Studies/Results: ? ?Anti-infectives: ?Anti-infectives (From admission, onward)  ? ? Start     Dose/Rate Route Frequency Ordered Stop  ? 12/20/21 0845  vancomycin (VANCOCIN) IVPB 1000 mg/200 mL premix       ?See Hyperspace for full Linked Orders Report.  ? 1,000 mg ?200 mL/hr over 60 Minutes Intravenous  Once 12/20/21 0838 12/20/21 1102  ? 12/20/21 0838  gentamicin (GARAMYCIN) 400 mg in dextrose 5 % 100 mL IVPB       ?See Hyperspace for full Linked Orders Report.  ? 5 mg/kg ? 79.1 kg ?110 mL/hr over 60 Minutes Intravenous 60 min pre-op 12/20/21 1027 12/20/21 1221  ? ?  ? ? ? ?Assessment/Plan: ?Patient Active Problem List  ? Diagnosis Date Noted  ? S/P right hemicolectomy  12/20/2021  ? Bilateral femoral hernias s/p lap repair w mesh 04/09/2018 04/09/2018  ? Right inguinal hernia s/p lap repair w mesh 04/09/2018 04/09/2018  ? Overweight (BMI 25.0-29.9) 08/08/2017  ? B12 deficiency 05/02/2016  ? Generalized anxiety disorder 05/02/2016  ? Type 2 diabetes mellitus with hyperglycemia, without long-term current use of insulin ([place]) 05/02/2016  ? Chronic right-sided low back pain without sciatica 05/19/2015  ? Sleep apnea 03/02/2015  ? Vitamin D deficiency 07/02/2014  ? Elevated platelet count 07/02/2014  ? Family history of premature CAD 04/03/2014  ? Open-angle glaucoma 09/02/2013  ? Hyperlipidemia LDL goal <70 07/22/2013  ? Anxiety 03/14/2012  ? GERD (gastroesophageal reflux disease) 03/14/2012  ? Essential hypertension 07/11/2011  ? ?s/p Procedure(s): ?LAPAROSCOPIC RIGHT HEMI COLECTOMY 12/20/2021 ? ?-Diet as tolerated  ?-Monitor today - discussed that bleeding from the anastomosis after these procedures is almost always self limited and resolves with supportive care. Will recheck hgb this afternoon and again tomorrow. Held subQ heparin ?-Ambulate 5x/day ?-D/C Entereg ?-Ppx: SCDs ? ? LOS: 2 days  ? ?Nadeen Landau, MD FACS ?[facility] ?

## 2021-12-23 LAB — CBC
HCT: 37.4 % — ABNORMAL LOW (ref 39.0–52.0)
Hemoglobin: 12.5 g/dL — ABNORMAL LOW (ref 13.0–17.0)
MCH: 32.6 pg (ref 26.0–34.0)
MCHC: 33.4 g/dL (ref 30.0–36.0)
MCV: 97.4 fL (ref 80.0–100.0)
Platelets: 230 10*3/uL (ref 150–400)
RBC: 3.84 MIL/uL — ABNORMAL LOW (ref 4.22–5.81)
RDW: 12.5 % (ref 11.5–15.5)
WBC: 9.7 10*3/uL (ref 4.0–10.5)
nRBC: 0 % (ref 0.0–0.2)

## 2021-12-23 LAB — BASIC METABOLIC PANEL
Anion gap: 13 (ref 5–15)
BUN: 11 mg/dL (ref 6–20)
CO2: 22 mmol/L (ref 22–32)
Calcium: 8.7 mg/dL — ABNORMAL LOW (ref 8.9–10.3)
Chloride: 103 mmol/L (ref 98–111)
Creatinine, Ser: 0.91 mg/dL (ref 0.61–1.24)
GFR, Estimated: >60 mL/min (ref >60)
Glucose, Bld: 84 mg/dL (ref 70–99)
Potassium: 3.9 mmol/L (ref 3.5–5.1)
Sodium: 138 mmol/L (ref 135–145)

## 2021-12-23 LAB — GLUCOSE, CAPILLARY: Glucose-Capillary: 88 mg/dL (ref 70–99)

## 2021-12-23 MED ORDER — TRAMADOL HCL 50 MG PO TABS: 50.0000 mg | ORAL_TABLET | Freq: Four times a day (QID) | ORAL | 0 refills | Status: AC | PRN

## 2021-12-23 NOTE — Discharge Summary (Signed)
Physician Discharge Summary  ?Patient ID: ?Shawn Perez ?MRN: [id number] ?DOB/AGE: [birth year] 58 y.o. ? ?Admit date: 12/20/2021 ?Discharge date: 12/23/2021 ? ?Admission Diagnoses: Colon polyps ? ?Discharge Diagnoses:  ?Principal Problem: ?  S/P right hemicolectomy ? ? ?Discharged Condition: good ? ?Hospital Course: Patient tolerated right hemicolectomy well.  He did have some bleeding with his bowel movements on postop day 1 and 2 which resolved by postop day 3.  He is hemoglobin stable over 24 hours between 12.5 and 12.9.  He had no tachycardia.  He had minimal abdominal pain.  He was tolerating his diet and ambulating with good pain control and was discharged on postop day 3 given the fact that his hemoglobin was stable. ? ? ? ?Significant Diagnostic Studies: labs: CBC ?   ?Component Value Date/Time  ? WBC 9.7 12/23/2021 0359  ? RBC 3.84 (L) 12/23/2021 0359  ? HGB 12.5 (L) 12/23/2021 0359  ? HCT 37.4 (L) 12/23/2021 0359  ? PLT 230 12/23/2021 0359  ? MCV 97.4 12/23/2021 0359  ? MCH 32.6 12/23/2021 0359  ? MCHC 33.4 12/23/2021 0359  ? RDW 12.5 12/23/2021 0359  ? LYMPHSABS 1,222 05/25/2020 0944  ? MONOABS 0.7 07/21/2018 1518  ? EOSABS 88 05/25/2020 0944  ? BASOSABS 42 05/25/2020 0944  ?  ? ?Treatments: surgery: Laparoscopic right hemicolectomy ? ?Discharge Exam: ?Blood pressure 118/62, pulse 87, temperature 99 ?F (37.2 ?C), temperature source Oral, resp. rate 16, height _0  (1.727 m), weight 79.2 kg, SpO2 95 %. ?General appearance: alert and cooperative ?Resp: clear to auscultation bilaterally ?Cardio: Normal sinus rhythm ?Incision/Wound: Port sites clean dry intact.  Incision clean dry intact.  Abdomen soft minimal tenderness without rebound or guarding ? ?Disposition: Discharge disposition: 01-Home or Self Care ? ? ? ? ? ? ?Discharge Instructions   ? ? Diet - low sodium heart healthy   Complete by: As directed ?  ? Increase activity slowly   Complete by: As directed ?  ? ?  ? ?Allergies as of 12/23/2021   ? ?    Reactions  ? Fenofibrate Rash  ? Penicillins Rash  ? Childhood allergy ?Has patient had a PCN reaction causing immediate rash, facial/tongue/throat swelling, SOB or lightheadedness with hypotension: Yes ?Has patient had a PCN reaction causing severe rash involving mucus membranes or skin necrosis: No ?Has patient had a PCN reaction that required hospitalization: No ?Has patient had a PCN reaction occurring within the last 10 years: No ?If all of the above answers are "NO", then may proceed with Cephalosporin use.  ? ?  ? ?  ?Medication List  ?  ? ?STOP taking these medications   ? ?meloxicam 15 MG tablet ?Commonly known as: MOBIC ?  ? ?  ? ?TAKE these medications   ? ?ARIPiprazole 10 MG tablet ?Commonly known as: ABILIFY ?Take 1 tablet (10 mg total) by mouth daily. ?  ?Jardiance 25 MG Tabs tablet ?Generic drug: empagliflozin ?Take 1 tablet (25 mg total) by mouth daily. ?  ?latanoprost 0.005 % ophthalmic solution ?Commonly known as: XALATAN ?Place 1 drop into both eyes at bedtime. ?  ?losartan 50 MG tablet ?Commonly known as: COZAAR ?Take 1 tablet (50 mg total) by mouth daily. ?  ?metFORMIN 1000 MG tablet ?Commonly known as: GLUCOPHAGE ?TAKE 1 TABLET BY MOUTH TWICE DAILY WITH A MEAL ?  ?omega-3 acid ethyl esters 1 g capsule ?Commonly known as: LOVAZA ?Take 2 capsules (2 g total) by mouth 2 (two) times daily. ?  ?[place]  KIT w/Device Kit ?1 kit by Does not apply route once. ?  ?OneTouch Ultra test strip ?Generic drug: glucose blood ?USE AS DIRECTED TO TEST BLOOD SUGAR 1 TIME DAILY ?  ?onetouch ultrasoft lancets ?Use as instructed ?  ?pioglitazone 30 MG tablet ?Commonly known as: Actos ?Take 1 tablet (30 mg total) by mouth daily. ?  ?rosuvastatin 20 MG tablet ?Commonly known as: CRESTOR ?Take 1 tablet (20 mg total) by mouth daily. ?  ?traMADol 50 MG tablet ?Commonly known as: Ultram ?Take 1 tablet (50 mg total) by mouth every 6 (six) hours as needed for up to 5 days (postop pain not controlled with  tylenol first). ?  ? ?  ? ? Follow-up Information   ? ? Ileana Roup, MD. Schedule an appointment as soon as possible for a visit in 2 week(s).   ?Specialties: General Surgery, Colon and Rectal Surgery ?Why: 2-3 weeks for postoperative appointment ?Contact information: ?[address] ?[address] ?[place] [address] ?[phone] ? ? ?  ?  ? ?  ?  ? ?  ? ? ?Signed: ?Joyice Faster Jaylynn Mcaleer md ?12/23/2021, 9:20 AM ? ? ?

## 2021-12-23 NOTE — Progress Notes (Signed)
Reviewed written d/c instructions w pt and all questions answered. He verbalized understanding. D/ C via w/c w all belongings in stable condition. 

## 2021-12-25 ENCOUNTER — Other Ambulatory Visit: Payer: Self-pay | Admitting: Family Medicine

## 2021-12-25 ENCOUNTER — Other Ambulatory Visit (HOSPITAL_BASED_OUTPATIENT_CLINIC_OR_DEPARTMENT_OTHER): Payer: Self-pay

## 2021-12-25 MED ORDER — LOSARTAN POTASSIUM 50 MG PO TABS
50.0000 mg | ORAL_TABLET | Freq: Every day | ORAL | 2 refills | Status: DC
  Filled 2021-12-25: qty 90, 90d supply, fill #0
  Filled 2022-04-02: qty 90, 90d supply, fill #1
  Filled 2022-07-01: qty 90, 90d supply, fill #2

## 2022-01-10 ENCOUNTER — Encounter: Payer: Self-pay | Admitting: Family Medicine

## 2022-01-10 ENCOUNTER — Other Ambulatory Visit (HOSPITAL_BASED_OUTPATIENT_CLINIC_OR_DEPARTMENT_OTHER): Payer: Self-pay

## 2022-01-10 ENCOUNTER — Ambulatory Visit (INDEPENDENT_AMBULATORY_CARE_PROVIDER_SITE_OTHER): Payer: BC Managed Care – PPO | Admitting: Family Medicine

## 2022-01-10 VITALS — BP 106/72 | HR 65 | Temp 97.8°F | Ht 68.0 in | Wt 169.1 lb

## 2022-01-10 DIAGNOSIS — E782 Mixed hyperlipidemia: Secondary | ICD-10-CM | POA: Diagnosis not present

## 2022-01-10 DIAGNOSIS — E1165 Type 2 diabetes mellitus with hyperglycemia: Secondary | ICD-10-CM | POA: Diagnosis not present

## 2022-01-10 LAB — LIPID PANEL
Cholesterol: 101 mg/dL (ref 0–200)
HDL: 41.9 mg/dL (ref >39.00)
LDL Cholesterol: 25 mg/dL (ref 0–99)
NonHDL: 58.89
Total CHOL/HDL Ratio: 2
Triglycerides: 171 mg/dL — ABNORMAL HIGH (ref 0.0–149.0)
VLDL: 34.2 mg/dL (ref 0.0–40.0)

## 2022-01-10 LAB — HEPATIC FUNCTION PANEL
ALT: 55 U/L — ABNORMAL HIGH (ref 0–53)
AST: 32 U/L (ref 0–37)
Albumin: 4.6 g/dL (ref 3.5–5.2)
Alkaline Phosphatase: 62 U/L (ref 39–117)
Bilirubin, Direct: 0.1 mg/dL (ref 0.0–0.3)
Total Bilirubin: 0.6 mg/dL (ref 0.2–1.2)
Total Protein: 6.7 g/dL (ref 6.0–8.3)

## 2022-01-10 LAB — HEMOGLOBIN A1C: Hgb A1c MFr Bld: 6.8 % — ABNORMAL HIGH (ref 4.6–6.5)

## 2022-01-10 NOTE — Progress Notes (Signed)
Subjective:  ? ?Chief Complaint  ?Patient presents with  ? Follow-up  ? ? ?Shawn Perez is a 55 y.o. male here for follow-up of diabetes.   ?Shawn Perez's self monitored glucose range is low 100's in AM, mid-high 100's non-fasting. Marland Kitchen  ?Patient denies hypoglycemic reactions. ?He checks his glucose levels several times per week. ?Patient does not require insulin.   ?Medications include: Jardiance 25 mg/d, Metformin 1000 mg/d, Actos 30 mg/d.  ?Diet is fair.  ?Exercise: none since surgery ?No CP or SOB.  ? ?Hyperlipidemia ?Patient presents for dyslipidemia follow up. ?Currently being treated with Crestor 20 mg/d, Lovaza 2 g bid and compliance with treatment thus far has been good. ?He denies myalgias. ?Diet/exercise as above.  ?The patient is not known to have coexisting coronary artery disease. ? ?Past Medical History:  ?Diagnosis Date  ? Anxiety   ? Diabetes mellitus without complication ([place])   ? type 2  ? Family history of adverse reaction to anesthesia   ? Shawn Perez has trouble waking up both times under anesthesia diagnosed with sleep apnea  ? GERD (gastroesophageal reflux disease)   ? "had 4-5 years ago"  ? Heart murmur   ? since birth  ? Hyperlipidemia 2011  ? Hypertension   ? Sleep apnea   ? wears cpap  ? Temporomandibular jaw dysfunction   ?  ? ?Related testing: ?Retinal exam: Done ?Pneumovax: done ? ?Objective:  ?BP 106/72   Pulse 65   Temp 97.8 ?F (36.6 ?C) (Oral)   Ht '5\' 8"'$  (1.727 m)   Wt 169 lb 2 oz (76.7 kg)   SpO2 99%   BMI 25.72 kg/m?  ?General:  Well developed, well nourished, in no apparent distress ?Skin:  Warm, no pallor or diaphoresis ?Lungs:  CTAB, no access msc use ?Cardio:  RRR, no bruits, no LE edema ?Psych: Age appropriate judgment and insight ? ?Assessment:  ? ?Type 2 diabetes mellitus with hyperglycemia, without long-term current use of insulin (HCC) - Plan: Hemoglobin A1c ? ?Mixed hyperlipidemia - Plan: Hepatic function panel, Lipid panel  ? ?Plan:  ? ?Chronic, unsure if stable. Reck A1c.  Cont Jardiance 25 mg/d, Metformin 1000 mg bid, Actos 30 mg/d. Counseled on diet and exercise. ?Chronic, uncontrolled. Cont Crestor 20 mg/d, Lovaza 2 g bid. Failed fenofibrate.  ?F/u pending above.  ?The patient voiced understanding and agreement to the plan. ? ?Shawn Pal, DO ?01/10/22 ?9:58 AM ? ?

## 2022-01-10 NOTE — Patient Instructions (Signed)
Give [place] 2-3 business days to get the results of your labs back.  ? ?Keep the diet clean and stay active. ? ?Keep checking your sugars.  ? ?Let us know if you need anything. ?

## 2022-01-16 ENCOUNTER — Telehealth: Payer: Self-pay

## 2022-01-16 NOTE — Telephone Encounter (Signed)
Called pt to schedule appt. Pt is scheduled for f/u on 6/15 at 8:40 am. Pt verbalized understanding. Letter sent to [facility] and mailed.

## 2022-01-16 NOTE — Telephone Encounter (Signed)
-----   Message from [place], DO sent at 01/15/2022  2:12 PM EDT ----- Can you please schedule this patient a f/u appt with me to discuss when to do next colonoscopy for ongoing surveillance after recent right hemicolectomy. No rush. Can be months out if need be since he just had surgery in April.   Thanks.

## 2022-01-28 ENCOUNTER — Other Ambulatory Visit: Payer: Self-pay | Admitting: Family Medicine

## 2022-01-29 ENCOUNTER — Other Ambulatory Visit (HOSPITAL_BASED_OUTPATIENT_CLINIC_OR_DEPARTMENT_OTHER): Payer: Self-pay

## 2022-01-29 MED ORDER — ROSUVASTATIN CALCIUM 20 MG PO TABS
20.0000 mg | ORAL_TABLET | Freq: Every day | ORAL | 2 refills | Status: DC
  Filled 2022-01-29: qty 90, 90d supply, fill #0
  Filled 2022-02-02: qty 90, 90d supply, fill #1
  Filled 2022-04-24: qty 90, 90d supply, fill #2

## 2022-01-29 MED ORDER — EMPAGLIFLOZIN 25 MG PO TABS
25.0000 mg | ORAL_TABLET | Freq: Every day | ORAL | 2 refills | Status: DC
  Filled 2022-01-29: qty 90, 90d supply, fill #0
  Filled 2022-02-25 – 2022-04-24 (×4): qty 90, 90d supply, fill #1
  Filled 2022-07-01 – 2022-07-06 (×2): qty 90, 90d supply, fill #2

## 2022-02-02 ENCOUNTER — Other Ambulatory Visit (HOSPITAL_BASED_OUTPATIENT_CLINIC_OR_DEPARTMENT_OTHER): Payer: Self-pay

## 2022-02-08 ENCOUNTER — Ambulatory Visit: Payer: BC Managed Care – PPO | Admitting: Gastroenterology

## 2022-02-08 IMAGING — CT CT CHEST W/ CM
2 of 4 series · 15 of 36 positions shown, 18 images · IV contrast (omnipaque)
Comparison: 04/12/2021

CLINICAL DATA: Abnormal chest x-ray, posterior pulmonary nodule
suspected on the lateral view.

EXAM:
CT CHEST WITH CONTRAST
TECHNIQUE: Multidetector CT imaging of the chest was performed during
intravenous contrast administration.
CONTRAST:  80mL OMNIPAQUE IOHEXOL 300 MG/ML  SOLN

[Series 2: axial st · axial · 0.86mm/px · z∈[+1024,+1286]mm · 12 of 155 slices shown, 15 images]
[im 12/155  mediastinal]
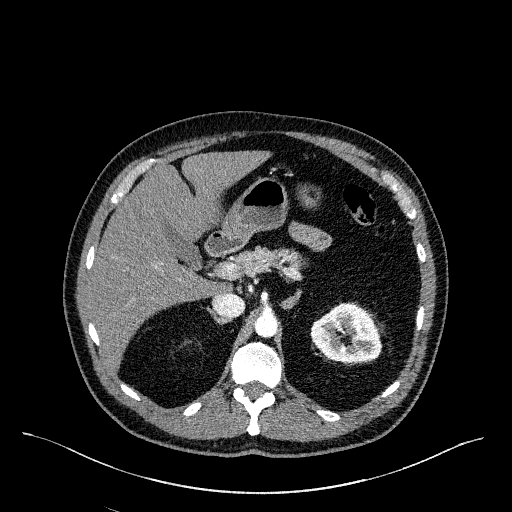
[im 12/155  lung]
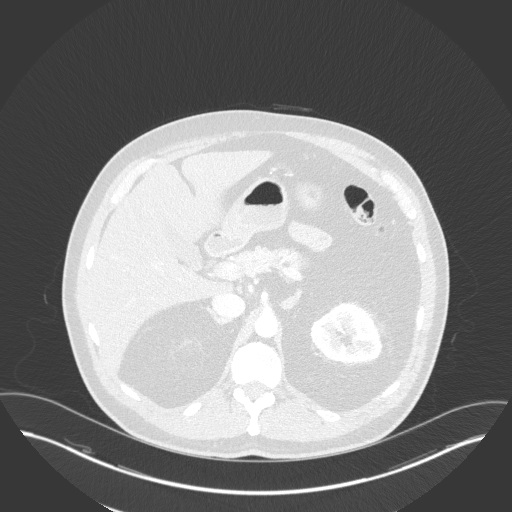
[im 24/155  lung]
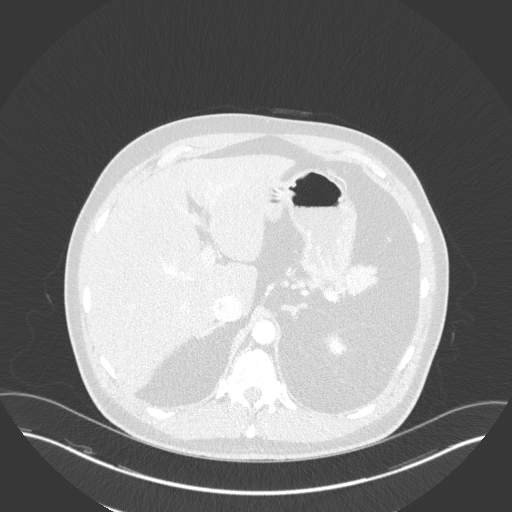
[im 36/155  lung]
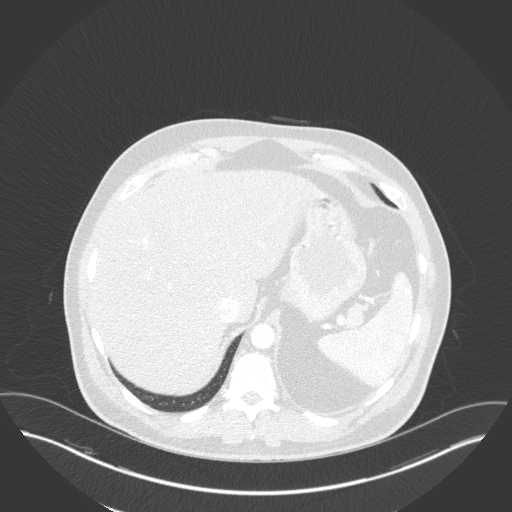
[im 48/155  lung]
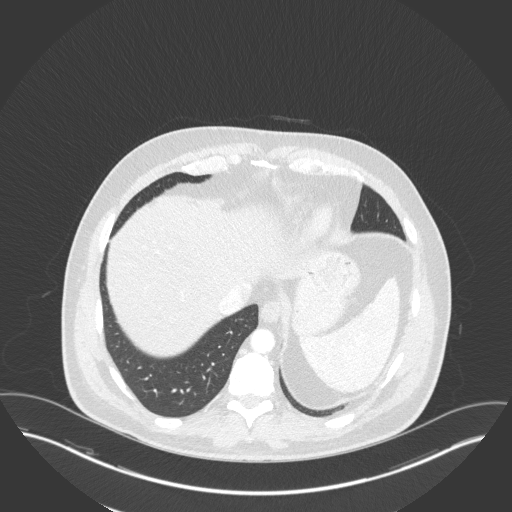
[im 60/155  mediastinal]
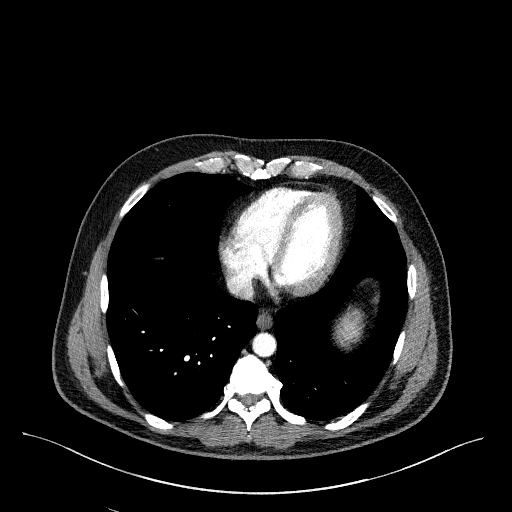
[im 60/155  lung]
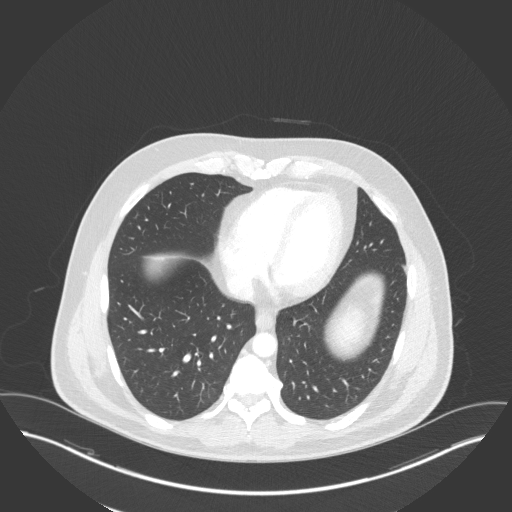
[im 72/155  lung]
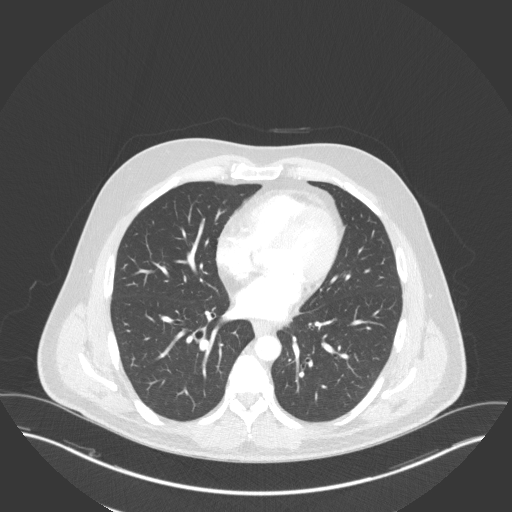
[im 83/155  lung]
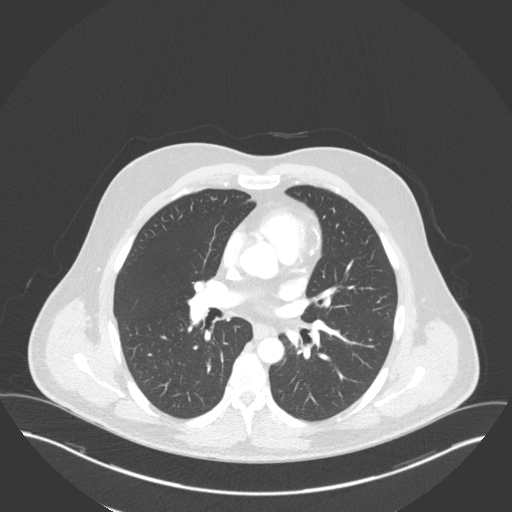
[im 95/155  lung]
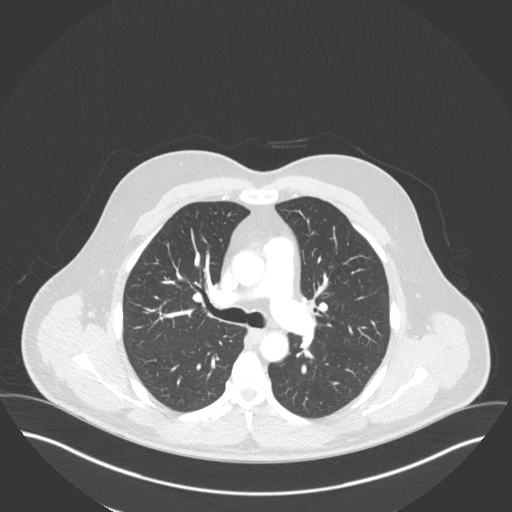
[im 107/155  mediastinal]
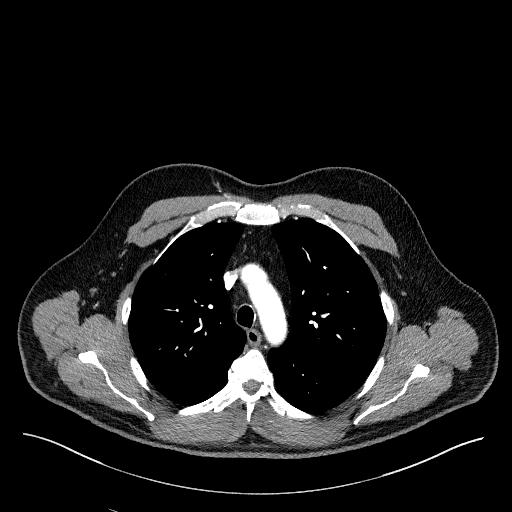
[im 107/155  lung]
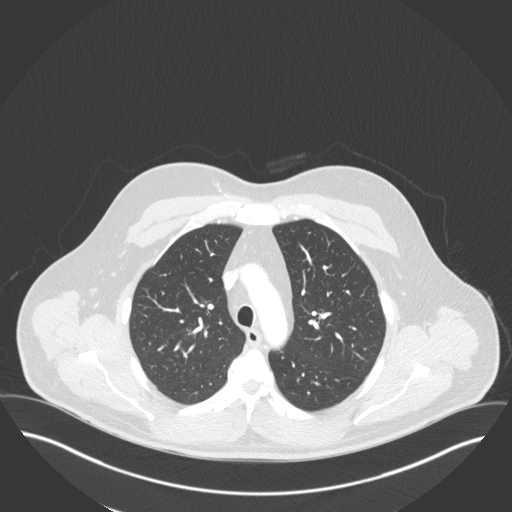
[im 119/155  lung]
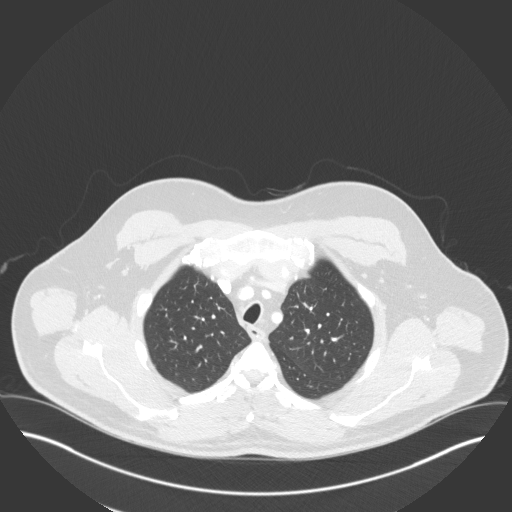
[im 131/155  lung]
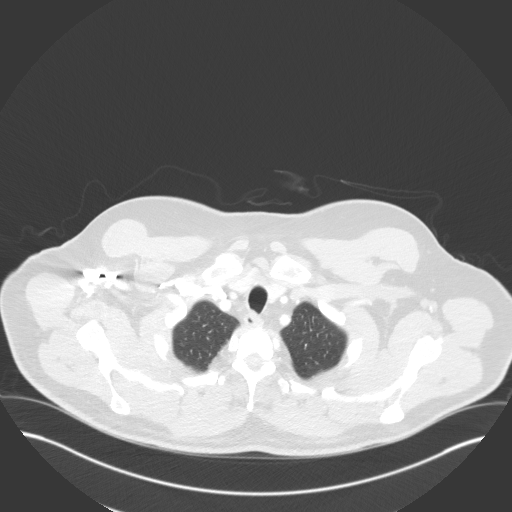
[im 143/155  lung]
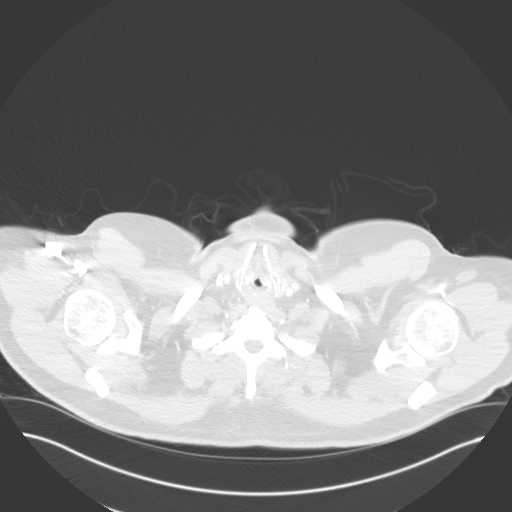

[Series 5: coronal · coronal · 0.60mm/px · 3 of 151 slices shown]
[im 31/151  lung]
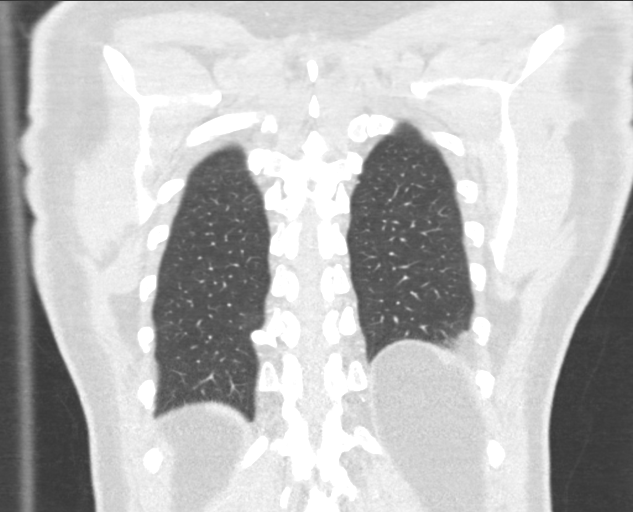
[im 61/151  lung]
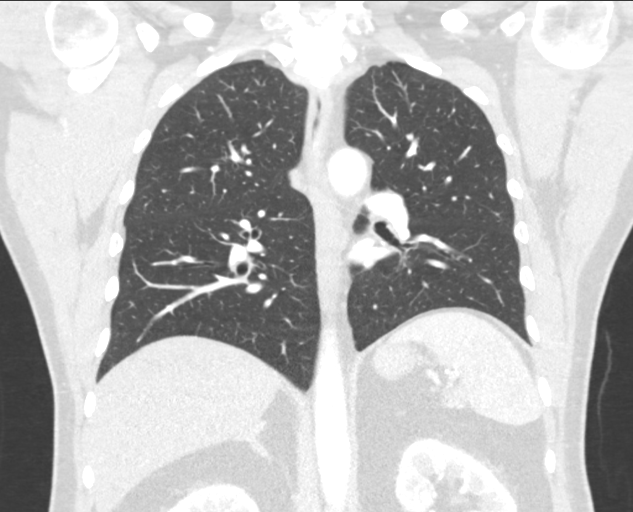
[im 91/151  lung]
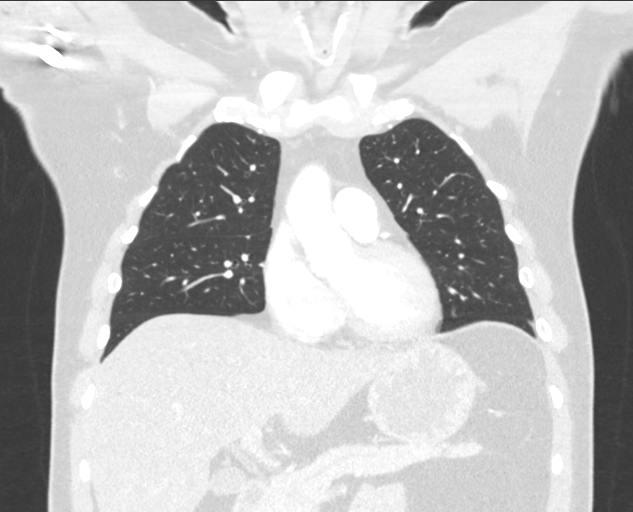

[15 of 36 positions shown; findings below may reference images not displayed]

FINDINGS: Cardiovascular: Very minimal aortic and major branch vessel
atherosclerotic change without aneurysm or dissection. No
mediastinal hemorrhage or hematoma. Patent 3 vessel arch anatomy.

Central pulmonary arteries are normal in caliber and patent. No
significant pulmonary embolus or filling defect by CT.

Central venous structures appear patent.  No Delowr process.

Native coronary atherosclerosis noted. Normal heart size. No
pericardial effusion.

Mediastinum/Nodes: No enlarged mediastinal, hilar, or axillary lymph
nodes. Thyroid gland, trachea, and esophagus demonstrate no
significant findings.

Lungs/Pleura: Lungs are clear. No pleural effusion or pneumothorax.
Negative for pulmonary nodule.

Upper Abdomen: Hypoattenuation of the liver suggesting component of
hepatic steatosis. Gallbladder collapsed. Common bile duct
nondilated. No biliary obstruction or dilatation. Abdominal aortic
atherosclerosis noted. Calcified splenic granuloma evident. No acute
upper abdominal finding.

Musculoskeletal: Degenerative hypertrophic changes and osteophytes
noted along the posterior costovertebral junction at T10 on the
right and T9 on the left which appear to account for the chest x-ray
abnormality.

No acute osseous finding.  Intact thoracic spine and sternum.
IMPRESSION: T9-10 degenerative hypertrophic changes and osteophytes of the
posterior costovertebral junctions noted which appeared account the
chest x-ray finding.

Negative for nodule.

No other acute intrathoracic finding

Aortic Atherosclerosis ([id number]-[id number]).

## 2022-02-12 ENCOUNTER — Other Ambulatory Visit (HOSPITAL_BASED_OUTPATIENT_CLINIC_OR_DEPARTMENT_OTHER): Payer: Self-pay

## 2022-02-25 ENCOUNTER — Other Ambulatory Visit: Payer: Self-pay | Admitting: Family Medicine

## 2022-02-26 ENCOUNTER — Other Ambulatory Visit (HOSPITAL_BASED_OUTPATIENT_CLINIC_OR_DEPARTMENT_OTHER): Payer: Self-pay

## 2022-02-26 MED ORDER — PIOGLITAZONE HCL 30 MG PO TABS
30.0000 mg | ORAL_TABLET | Freq: Every day | ORAL | 2 refills | Status: DC
  Filled 2022-02-26: qty 30, 30d supply, fill #0
  Filled 2022-04-02: qty 30, 30d supply, fill #1
  Filled 2022-04-24 – 2022-04-25 (×2): qty 30, 30d supply, fill #2

## 2022-03-01 ENCOUNTER — Other Ambulatory Visit (HOSPITAL_BASED_OUTPATIENT_CLINIC_OR_DEPARTMENT_OTHER): Payer: Self-pay

## 2022-03-26 ENCOUNTER — Ambulatory Visit (INDEPENDENT_AMBULATORY_CARE_PROVIDER_SITE_OTHER): Payer: BC Managed Care – PPO | Admitting: Gastroenterology

## 2022-03-26 ENCOUNTER — Encounter: Payer: Self-pay | Admitting: Gastroenterology

## 2022-03-26 VITALS — BP 112/58 | HR 71 | Ht 68.0 in | Wt 176.4 lb

## 2022-03-26 DIAGNOSIS — K760 Fatty (change of) liver, not elsewhere classified: Secondary | ICD-10-CM | POA: Diagnosis not present

## 2022-03-26 DIAGNOSIS — Z86010 Personal history of colonic polyps: Secondary | ICD-10-CM | POA: Diagnosis not present

## 2022-03-26 NOTE — Patient Instructions (Addendum)
If you are age 58 or younger, your body mass index should be between 19-25. Your Body mass index is 26.82 kg/m. If this is out of the aformentioned range listed, please consider follow up with your Primary Care Provider.   __________________________________________________________  The [facility] providers would like to encourage you to use [facility] to communicate with providers for non-urgent requests or questions.  Due to long hold times on the telephone, sending your provider a message by [facility] may be a faster and more efficient way to get a response.  Please allow 48 business hours for a response.  Please remember that this is for non-urgent requests.   Due to recent changes in healthcare laws, you may see the results of your imaging and laboratory studies on MyChart before your provider has had a chance to review them.  We understand that in some cases there may be results that are confusing or concerning to you. Not all laboratory results come back in the same time frame and the provider may be waiting for multiple results in order to interpret others.  Please give [place] 48 hours in order for your provider to thoroughly review all the results before contacting the office for clarification of your results.   You will be due for a recall colonoscopy in 11/2022. We will send you a reminder in the mail when it gets closer to that time.   Thank you for choosing me and [facility].  Vito Cirigliano, D.O.

## 2022-03-26 NOTE — Progress Notes (Signed)
Chief Complaint:    Postoperative follow-up  GI History: 56 year old male with history of large, endoscopically unresectable advanced colon polyp found on index colonoscopy in 09/2021.  Family history notable for maternal uncle with CRC.   - Colonoscopy (10/24/2021): Large villous mass in the cecum and proximal ascending colon obscuring ileocecal valve, tattoo placed distally and biopsied (path: Adenoma without clear carcinoma).  An additional 5 mm polyp and 15 mm flat laterally spreading polyp were also noted in the proximal ascending colon.  These fell within the tattoo field and will be resected surgically.  2 subcentimeter descending colon adenomatous, 5 mm sigmoid adenoma.  Sigmoid diverticulosis, internal hemorrhoids - CEA <2 - 11/03/2021: Evaluation in Colorectal Surgery clinic by Dr. Dema Severin with plan for right hemicolectomy for endoscopically unresectable polyp of the cecum - CT CAP (11/09/2021): Hepatic steatosis, 3.6 x 2.6 x 2.7 cm cecal mass without adenopathy or evidence of metastasis - 12/20/2021: Right hemicolectomy.  Path: Large TVA at IC valve measuring 6.2 x 3.2 x 1.9 cm.  Margins free.  30 benign pericolic lymph nodes.  Benign appendix  Separately, history of hepatic steatosis, incidentally on screening imaging. - 06/2013: RUQ [place]: Hepatic steatosis - 04/12/2021: Normal liver enzymes - 03/2021: CT Chest: Hepatic steatosis - 11/09/2021: CT C/A/P: Probable hepatic steatosis without duct dilation.  No hepatic mass.  Cecal mass as above - 11/15/2021: GI follow-up.  Recommended aggressive management of diabetes, HTN, HLD, exercise, dietary modification - 11/29/2021: Normal liver enzymes  HPI:     Patient is a 56 y.o. male presenting to the Gastroenterology Clinic for follow-up.  Last seen by me in the GI clinic on 11/15/2021.  Was asymptomatic at that time and preparing for right hemicolectomy which was completed on 12/20/2021 by Dr. Dema Severin.  Today, he states he is doing well after recent  right hemicolectomy.  He is essentially without any GI symptoms/complaints today.  Tolerating p.o. intake without issue.  Bowel habits at baseline.  Review of systems:     No chest pain, no SOB, no fevers, no urinary sx   Past Medical History:  Diagnosis Date   Anxiety    Diabetes mellitus without complication (HCC)    type 2   Family history of adverse reaction to anesthesia    [name] has trouble waking up both times under anesthesia diagnosed with sleep apnea   GERD (gastroesophageal reflux disease)    "had 4-5 years ago"   Heart murmur    since birth   Hyperlipidemia 2011   Hypertension    Sleep apnea    wears cpap   Temporomandibular jaw dysfunction     Patient's surgical history, family medical history, social history, medications and allergies were all reviewed in Epic    Current Outpatient Medications  Medication Sig Dispense Refill   ARIPiprazole (ABILIFY) 10 MG tablet Take 1 tablet (10 mg total) by mouth daily. 90 tablet 1   Blood Glucose Monitoring Suppl (ONE TOUCH ULTRA SYSTEM KIT) W/DEVICE KIT 1 kit by Does not apply route once. 1 each 0   empagliflozin (JARDIANCE) 25 MG TABS tablet Take 1 tablet (25 mg total) by mouth daily. 90 tablet 2   glucose blood (ONETOUCH ULTRA) test strip USE AS DIRECTED TO TEST BLOOD SUGAR 1 TIME DAILY 50 strip 1   Lancets (ONETOUCH ULTRASOFT) lancets Use as instructed 100 each 12   latanoprost (XALATAN) 0.005 % ophthalmic solution Place 1 drop into both eyes at bedtime.      losartan (COZAAR) 50 MG tablet  Take 1 tablet (50 mg total) by mouth daily. 90 tablet 2   metFORMIN (GLUCOPHAGE) 1000 MG tablet TAKE 1 TABLET BY MOUTH TWICE DAILY WITH A MEAL 180 tablet 3   omega-3 acid ethyl esters (LOVAZA) 1 g capsule Take 2 capsules (2 g total) by mouth 2 (two) times daily. 120 capsule 2   pioglitazone (ACTOS) 30 MG tablet Take 1 tablet (30 mg total) by mouth daily. 30 tablet 2   rosuvastatin (CRESTOR) 20 MG tablet Take 1 tablet (20 mg total) by  mouth daily. 90 tablet 2   No current facility-administered medications for this visit.    Physical Exam:     BP (!) 112/58   Pulse 71   Ht '5\' 8"'  (1.727 m)   Wt 176 lb 6 oz (80 kg)   BMI 26.82 kg/m   GENERAL:  Pleasant male in NAD PSYCH: : Cooperative, normal affect ABDOMEN:  Nondistended, soft.  Well-healed surgical incision site. NEURO: Alert and oriented x 3, no focal neurologic deficits   IMPRESSION and PLAN:    1) History of colon polyps Index colonoscopy in 09/2021 with large, unresectable adenoma in the cecum/IC valve, which was resected surgically in 11/2021.  0/30 lymph nodes involved, and margins clear.  Path with TVA without carcinoma or HGD.  Discussed surveillance guidelines at length today and decided on the following: - Colonoscopy in 1 year (11/2022) for ongoing short interval surveillance  2) Hepatic steatosis Incidentally noted hepatic steatosis on previous ultrasound and recent CT.  Otherwise normal liver enzymes.  Normal hepatic synthetic function.  Likely fatty liver related to underlying, but is (HTN, HLD, diabetes).  Discussed pathophysiology of fatty liver at length today. - Repeat liver enzymes in 1 year - Continue aggressive management of underlying comorbidities - Continue healthy eating habits and active lifestyle           Lavena Bullion ,DO, FACG 03/26/2022, 8:23 AM

## 2022-04-02 ENCOUNTER — Other Ambulatory Visit (HOSPITAL_BASED_OUTPATIENT_CLINIC_OR_DEPARTMENT_OTHER): Payer: Self-pay

## 2022-04-12 ENCOUNTER — Other Ambulatory Visit (HOSPITAL_BASED_OUTPATIENT_CLINIC_OR_DEPARTMENT_OTHER): Payer: Self-pay

## 2022-04-13 ENCOUNTER — Encounter: Payer: Self-pay | Admitting: Family Medicine

## 2022-04-13 ENCOUNTER — Other Ambulatory Visit (HOSPITAL_BASED_OUTPATIENT_CLINIC_OR_DEPARTMENT_OTHER): Payer: Self-pay

## 2022-04-13 ENCOUNTER — Ambulatory Visit (INDEPENDENT_AMBULATORY_CARE_PROVIDER_SITE_OTHER): Payer: BC Managed Care – PPO | Admitting: Family Medicine

## 2022-04-13 VITALS — BP 112/80 | HR 65 | Temp 98.2°F | Ht 68.0 in | Wt 174.2 lb

## 2022-04-13 DIAGNOSIS — E1165 Type 2 diabetes mellitus with hyperglycemia: Secondary | ICD-10-CM

## 2022-04-13 DIAGNOSIS — E782 Mixed hyperlipidemia: Secondary | ICD-10-CM | POA: Diagnosis not present

## 2022-04-13 LAB — COMPREHENSIVE METABOLIC PANEL
ALT: 29 U/L (ref 0–53)
AST: 22 U/L (ref 0–37)
Albumin: 4.7 g/dL (ref 3.5–5.2)
Alkaline Phosphatase: 69 U/L (ref 39–117)
BUN: 24 mg/dL — ABNORMAL HIGH (ref 6–23)
CO2: 32 mEq/L (ref 19–32)
Calcium: 9.7 mg/dL (ref 8.4–10.5)
Chloride: 98 mEq/L (ref 96–112)
Creatinine, Ser: 0.97 mg/dL (ref 0.40–1.50)
GFR: 86.22 mL/min (ref >60.00)
Glucose, Bld: 124 mg/dL — ABNORMAL HIGH (ref 70–99)
Potassium: 4.4 mEq/L (ref 3.5–5.1)
Sodium: 140 mEq/L (ref 135–145)
Total Bilirubin: 0.8 mg/dL (ref 0.2–1.2)
Total Protein: 6.9 g/dL (ref 6.0–8.3)

## 2022-04-13 LAB — LIPID PANEL
Cholesterol: 139 mg/dL (ref 0–200)
HDL: 40.1 mg/dL (ref >39.00)
NonHDL: 99.22
Total CHOL/HDL Ratio: 3
Triglycerides: 330 mg/dL — ABNORMAL HIGH (ref 0.0–149.0)
VLDL: 66 mg/dL — ABNORMAL HIGH (ref 0.0–40.0)

## 2022-04-13 LAB — LDL CHOLESTEROL, DIRECT: Direct LDL: 61 mg/dL

## 2022-04-13 LAB — HEMOGLOBIN A1C: Hgb A1c MFr Bld: 7.8 % — ABNORMAL HIGH (ref 4.6–6.5)

## 2022-04-13 MED ORDER — METFORMIN HCL ER 500 MG PO TB24
1000.0000 mg | ORAL_TABLET | Freq: Every day | ORAL | 2 refills | Status: DC
  Filled 2022-04-13: qty 180, 90d supply, fill #0
  Filled 2022-07-23: qty 180, 90d supply, fill #1
  Filled 2022-10-11: qty 180, 90d supply, fill #2

## 2022-04-13 NOTE — Progress Notes (Signed)
Subjective:   Chief Complaint  Patient presents with   Diabetes    Shawn Perez is a 56 y.o. male here for follow-up of diabetes.   Shawn Perez' Patient does not require insulin.   Medications include: metfomrin 1000 mg bid Diet is fair.  Exercise: active at work.   Hyperlipidemia Patient presents for dyslipidemia follow up. Currently being treated with Lovaza 2 g bid, Crestor 20 mg/d and compliance with treatment thus far has been good. He denies myalgias. Diet/exercise as above. No CP or SOB.  The patient is not known to have coexisting coronary artery disease.   Past Medical History:  Diagnosis Date   Anxiety    Diabetes mellitus without complication ([place])    type 2   Family history of adverse reaction to anesthesia    Shawn Perez has trouble waking up both times under anesthesia diagnosed with sleep apnea   GERD (gastroesophageal reflux disease)    "had 4-5 years ago"   Heart murmur    since birth   Hyperlipidemia 2011   Hypertension    Sleep apnea    wears cpap   Temporomandibular jaw dysfunction      Related testing: Retinal exam: Done Pneumovax: done  Objective:  BP 112/80   Pulse 65   Temp 98.2 F (36.8 C)   Ht '5\' 8"'$  (1.727 m)   Wt 174 lb 3.2 oz (79 kg)   SpO2 98%   BMI 26.49 kg/m  General:  Well developed, well nourished, in no apparent distress Skin:  Warm, no pallor or diaphoresis Head:  Normocephalic, atraumatic Eyes:  Pupils equal and round, sclera anicteric without injection  Lungs:  CTAB, no access msc use Cardio:  RRR, no bruits, no LE edema Musculoskeletal:  Symmetrical muscle groups noted without atrophy or deformity Neuro:  Sensation intact to pinprick on feet Psych: Age appropriate judgment and insight  Assessment:   Type 2 diabetes mellitus with hyperglycemia, without long-term current use of insulin (HCC) - Plan: Hemoglobin A1c  Mixed hyperlipidemia - Plan: Lipid panel, Comprehensive metabolic panel   Plan:   Chronic, stable.  Change Metformin from twice daily to daily in XR form. Cont jardiance 25 mg/d and Actos 30 mg/d. Counseled on diet and exercise. Chronic, unstable. Cont Crestor 20 mg/d, Lovaza 2 g bid. Did not do well w fenofibrate. Could consider increasing statin dosage or Zetia. F/u in 3-6 mo pending above. The patient voiced understanding and agreement to the plan.  [place], DO 04/13/22 7:55 AM

## 2022-04-13 NOTE — Patient Instructions (Signed)
Give us 2-3 business days to get the results of your labs back.   Keep the diet clean and stay active.  I recommend getting the flu shot in mid October. This suggestion would change if the CDC comes out with a different recommendation.   Let us know if you need anything. 

## 2022-04-24 ENCOUNTER — Other Ambulatory Visit (HOSPITAL_BASED_OUTPATIENT_CLINIC_OR_DEPARTMENT_OTHER): Payer: Self-pay

## 2022-04-25 ENCOUNTER — Other Ambulatory Visit (HOSPITAL_BASED_OUTPATIENT_CLINIC_OR_DEPARTMENT_OTHER): Payer: Self-pay

## 2022-05-06 ENCOUNTER — Other Ambulatory Visit (HOSPITAL_BASED_OUTPATIENT_CLINIC_OR_DEPARTMENT_OTHER): Payer: Self-pay

## 2022-05-07 ENCOUNTER — Other Ambulatory Visit (HOSPITAL_BASED_OUTPATIENT_CLINIC_OR_DEPARTMENT_OTHER): Payer: Self-pay

## 2022-05-10 ENCOUNTER — Other Ambulatory Visit (HOSPITAL_BASED_OUTPATIENT_CLINIC_OR_DEPARTMENT_OTHER): Payer: Self-pay

## 2022-05-14 ENCOUNTER — Other Ambulatory Visit (HOSPITAL_BASED_OUTPATIENT_CLINIC_OR_DEPARTMENT_OTHER): Payer: Self-pay

## 2022-05-14 MED ORDER — ARIPIPRAZOLE 10 MG PO TABS
10.0000 mg | ORAL_TABLET | Freq: Every day | ORAL | 1 refills | Status: DC
  Filled 2022-05-14: qty 30, 30d supply, fill #0
  Filled 2022-07-23: qty 30, 30d supply, fill #1

## 2022-06-04 ENCOUNTER — Other Ambulatory Visit: Payer: Self-pay | Admitting: Family Medicine

## 2022-06-04 ENCOUNTER — Other Ambulatory Visit (HOSPITAL_BASED_OUTPATIENT_CLINIC_OR_DEPARTMENT_OTHER): Payer: Self-pay

## 2022-06-04 MED ORDER — PIOGLITAZONE HCL 30 MG PO TABS
30.0000 mg | ORAL_TABLET | Freq: Every day | ORAL | 2 refills | Status: DC
  Filled 2022-06-04: qty 30, 30d supply, fill #0
  Filled 2022-07-01: qty 30, 30d supply, fill #1
  Filled 2022-07-23 – 2022-07-27 (×2): qty 30, 30d supply, fill #2

## 2022-06-12 ENCOUNTER — Other Ambulatory Visit (HOSPITAL_BASED_OUTPATIENT_CLINIC_OR_DEPARTMENT_OTHER): Payer: Self-pay

## 2022-06-12 DIAGNOSIS — F411 Generalized anxiety disorder: Secondary | ICD-10-CM | POA: Diagnosis not present

## 2022-06-12 DIAGNOSIS — F330 Major depressive disorder, recurrent, mild: Secondary | ICD-10-CM | POA: Diagnosis not present

## 2022-06-12 MED ORDER — ARIPIPRAZOLE 10 MG PO TABS
10.0000 mg | ORAL_TABLET | Freq: Every day | ORAL | 0 refills | Status: DC
  Filled 2022-06-12: qty 90, 90d supply, fill #0

## 2022-06-13 ENCOUNTER — Other Ambulatory Visit (HOSPITAL_BASED_OUTPATIENT_CLINIC_OR_DEPARTMENT_OTHER): Payer: Self-pay

## 2022-07-01 ENCOUNTER — Other Ambulatory Visit: Payer: Self-pay | Admitting: Family Medicine

## 2022-07-02 ENCOUNTER — Other Ambulatory Visit (HOSPITAL_BASED_OUTPATIENT_CLINIC_OR_DEPARTMENT_OTHER): Payer: Self-pay

## 2022-07-02 MED ORDER — OMEGA-3-ACID ETHYL ESTERS 1 G PO CAPS
2.0000 g | ORAL_CAPSULE | Freq: Two times a day (BID) | ORAL | 2 refills | Status: DC
  Filled 2022-07-02: qty 120, 30d supply, fill #0

## 2022-07-06 ENCOUNTER — Other Ambulatory Visit (HOSPITAL_BASED_OUTPATIENT_CLINIC_OR_DEPARTMENT_OTHER): Payer: Self-pay

## 2022-07-10 ENCOUNTER — Other Ambulatory Visit (HOSPITAL_BASED_OUTPATIENT_CLINIC_OR_DEPARTMENT_OTHER): Payer: Self-pay

## 2022-07-11 ENCOUNTER — Other Ambulatory Visit (HOSPITAL_BASED_OUTPATIENT_CLINIC_OR_DEPARTMENT_OTHER): Payer: Self-pay

## 2022-07-23 ENCOUNTER — Other Ambulatory Visit: Payer: Self-pay | Admitting: Family Medicine

## 2022-07-23 ENCOUNTER — Ambulatory Visit (INDEPENDENT_AMBULATORY_CARE_PROVIDER_SITE_OTHER): Payer: BC Managed Care – PPO | Admitting: Family Medicine

## 2022-07-23 ENCOUNTER — Encounter: Payer: Self-pay | Admitting: Family Medicine

## 2022-07-23 ENCOUNTER — Other Ambulatory Visit (HOSPITAL_BASED_OUTPATIENT_CLINIC_OR_DEPARTMENT_OTHER): Payer: Self-pay

## 2022-07-23 VITALS — BP 112/62 | HR 63 | Temp 97.7°F | Ht 68.0 in | Wt 181.1 lb

## 2022-07-23 DIAGNOSIS — E119 Type 2 diabetes mellitus without complications: Secondary | ICD-10-CM | POA: Diagnosis not present

## 2022-07-23 DIAGNOSIS — E1165 Type 2 diabetes mellitus with hyperglycemia: Secondary | ICD-10-CM | POA: Diagnosis not present

## 2022-07-23 DIAGNOSIS — E782 Mixed hyperlipidemia: Secondary | ICD-10-CM

## 2022-07-23 LAB — LIPID PANEL
Cholesterol: 126 mg/dL (ref 0–200)
HDL: 39.8 mg/dL (ref >39.00)
NonHDL: 85.84
Total CHOL/HDL Ratio: 3
Triglycerides: 252 mg/dL — ABNORMAL HIGH (ref 0.0–149.0)
VLDL: 50.4 mg/dL — ABNORMAL HIGH (ref 0.0–40.0)

## 2022-07-23 LAB — COMPREHENSIVE METABOLIC PANEL
ALT: 34 U/L (ref 0–53)
AST: 22 U/L (ref 0–37)
Albumin: 4.8 g/dL (ref 3.5–5.2)
Alkaline Phosphatase: 68 U/L (ref 39–117)
BUN: 19 mg/dL (ref 6–23)
CO2: 30 mEq/L (ref 19–32)
Calcium: 9.6 mg/dL (ref 8.4–10.5)
Chloride: 99 mEq/L (ref 96–112)
Creatinine, Ser: 1.03 mg/dL (ref 0.40–1.50)
GFR: 80.07 mL/min (ref >60.00)
Glucose, Bld: 143 mg/dL — ABNORMAL HIGH (ref 70–99)
Potassium: 4.2 mEq/L (ref 3.5–5.1)
Sodium: 137 mEq/L (ref 135–145)
Total Bilirubin: 0.8 mg/dL (ref 0.2–1.2)
Total Protein: 7 g/dL (ref 6.0–8.3)

## 2022-07-23 LAB — LDL CHOLESTEROL, DIRECT: Direct LDL: 58 mg/dL

## 2022-07-23 LAB — MICROALBUMIN / CREATININE URINE RATIO
Creatinine,U: 74.1 mg/dL
Microalb Creat Ratio: 0.9 mg/g (ref 0.0–30.0)
Microalb, Ur: <0.7 mg/dL (ref 0.0–1.9)

## 2022-07-23 LAB — HEMOGLOBIN A1C: Hgb A1c MFr Bld: 7.2 % — ABNORMAL HIGH (ref 4.6–6.5)

## 2022-07-23 MED ORDER — CONTOUR NEXT TEST VI STRP
ORAL_STRIP | 12 refills | Status: DC
  Filled 2022-07-23: qty 50, 50d supply, fill #0
  Filled 2023-05-17: qty 50, 50d supply, fill #1
  Filled 2023-07-04: qty 50, 50d supply, fill #2

## 2022-07-23 MED ORDER — PIOGLITAZONE HCL 45 MG PO TABS
45.0000 mg | ORAL_TABLET | Freq: Every day | ORAL | 3 refills | Status: DC
  Filled 2022-07-23: qty 30, 30d supply, fill #0
  Filled 2022-08-22: qty 30, 30d supply, fill #1
  Filled 2022-09-20: qty 30, 30d supply, fill #2
  Filled 2022-10-11 – 2022-10-15 (×2): qty 30, 30d supply, fill #3

## 2022-07-23 MED ORDER — ROSUVASTATIN CALCIUM 20 MG PO TABS
20.0000 mg | ORAL_TABLET | Freq: Every day | ORAL | 2 refills | Status: DC
  Filled 2022-07-23: qty 90, 90d supply, fill #0

## 2022-07-23 MED ORDER — CONTOUR NEXT EZ W/DEVICE KIT
PACK | 0 refills | Status: AC
  Filled 2022-07-23: qty 1, 30d supply, fill #0

## 2022-07-23 MED ORDER — MICROLET LANCETS MISC
3 refills | Status: AC
  Filled 2022-07-23: qty 100, 90d supply, fill #0

## 2022-07-23 MED ORDER — ONETOUCH ULTRA VI STRP
ORAL_STRIP | 1 refills | Status: DC
  Filled 2022-07-23: qty 50, 50d supply, fill #0

## 2022-07-23 MED ORDER — ROSUVASTATIN CALCIUM 40 MG PO TABS
40.0000 mg | ORAL_TABLET | Freq: Every day | ORAL | 3 refills | Status: DC
  Filled 2022-07-23: qty 90, 90d supply, fill #0
  Filled 2022-10-11: qty 90, 90d supply, fill #1
  Filled 2023-01-10: qty 90, 90d supply, fill #2
  Filled 2023-04-09: qty 90, 90d supply, fill #3

## 2022-07-23 MED ORDER — ONETOUCH ULTRASOFT LANCETS MISC
12 refills | Status: DC
  Filled 2022-07-23: qty 100, 90d supply, fill #0

## 2022-07-23 NOTE — Addendum Note (Signed)
Addended by: Sharon Seller B on: 07/23/2022 02:30 PM   Modules accepted: Orders

## 2022-07-23 NOTE — Addendum Note (Signed)
Addended by: Sharon Seller B on: 07/23/2022 03:56 PM   Modules accepted: Orders

## 2022-07-23 NOTE — Patient Instructions (Signed)
Give us 2-3 business days to get the results of your labs back.   Keep the diet clean and stay active.  Aim to do some physical exertion for 150 minutes per week. This is typically divided into 5 days per week, 30 minutes per day. The activity should be enough to get your heart rate up. Anything is better than nothing if you have time constraints.  Let us know if you need anything.  

## 2022-07-23 NOTE — Addendum Note (Signed)
Addended by: Sharon Seller B on: 07/23/2022 04:17 PM   Modules accepted: Orders

## 2022-07-23 NOTE — Progress Notes (Signed)
Subjective:   Chief Complaint  Patient presents with   Follow-up    3 month    MAE CIANCI is a 56 y.o. male here for follow-up of diabetes.   Wilfrido does not routinely monitor his sugars . Patient does not require insulin.   Medications include: Actos 30 mg/d, Metformin XR 1000 mg/d, Jardiance 25 mg/d Diet is fair.  Exercise: none No CP or SOB.   Hyperlipidemia Patient presents for dyslipidemia follow up. Currently being treated with Crestor 20 mg/d, Lovaza 2 g bid and compliance with treatment thus far has been good. He denies myalgias. Diet/exercise as above.  The patient is not known to have coexisting coronary artery disease.  Past Medical History:  Diagnosis Date   Anxiety    Diabetes mellitus without complication ([place])    type 2   Family history of adverse reaction to anesthesia    [name] has trouble waking up both times under anesthesia diagnosed with sleep apnea   GERD (gastroesophageal reflux disease)    "had 4-5 years ago"   Heart murmur    since birth   Hyperlipidemia 2011   Hypertension    Sleep apnea    wears cpap   Temporomandibular jaw dysfunction      Related testing: Retinal exam: Done Pneumovax: not done  Objective:  BP 112/62 (BP Location: Left Arm, Patient Position: Sitting, Cuff Size: Normal)   Pulse 63   Temp 97.7 F (36.5 C) (Oral)   Ht '5\' 8"'$  (1.727 m)   Wt 181 lb 2 oz (82.2 kg)   SpO2 98%   BMI 27.54 kg/m  General:  Well developed, well nourished, in no apparent distress Lungs:  CTAB, no access msc use Cardio:  RRR, no bruits, no LE edema Psych: Age appropriate judgment and insight  Assessment:   Type 2 diabetes mellitus with hyperglycemia, without long-term current use of insulin (HCC) - Plan: Microalbumin / creatinine urine ratio, Hemoglobin A1c, Lipid panel, Comprehensive metabolic panel  Mixed hyperlipidemia - Plan: Comprehensive metabolic panel   Plan:   Chronic, unsure if stable. Cont Jardiance 25 mg/d, Actos 30  mg/d, Metformin XR 1000 mg/d. Counseled on diet and exercise. Cont Lovaza 2 g bid, Crestor 20 mg/d.  F/u in 3-6 mo. The patient voiced understanding and agreement to the plan.  [place], DO 07/23/22 8:47 AM

## 2022-07-23 NOTE — Addendum Note (Signed)
Addended by: Sharon Seller B on: 07/23/2022 04:28 PM   Modules accepted: Orders

## 2022-07-27 ENCOUNTER — Other Ambulatory Visit (HOSPITAL_BASED_OUTPATIENT_CLINIC_OR_DEPARTMENT_OTHER): Payer: Self-pay

## 2022-09-04 ENCOUNTER — Ambulatory Visit: Payer: BC Managed Care – PPO | Admitting: Family

## 2022-09-04 ENCOUNTER — Encounter: Payer: Self-pay | Admitting: Family Medicine

## 2022-09-04 ENCOUNTER — Other Ambulatory Visit (HOSPITAL_BASED_OUTPATIENT_CLINIC_OR_DEPARTMENT_OTHER): Payer: Self-pay

## 2022-09-04 ENCOUNTER — Encounter: Payer: Self-pay | Admitting: Family

## 2022-09-04 ENCOUNTER — Ambulatory Visit: Payer: BC Managed Care – PPO | Admitting: Family Medicine

## 2022-09-04 VITALS — BP 118/64 | HR 75 | Ht 68.0 in | Wt 185.8 lb

## 2022-09-04 VITALS — BP 122/68 | Ht 68.0 in | Wt 185.0 lb

## 2022-09-04 DIAGNOSIS — F411 Generalized anxiety disorder: Secondary | ICD-10-CM | POA: Diagnosis not present

## 2022-09-04 DIAGNOSIS — M7021 Olecranon bursitis, right elbow: Secondary | ICD-10-CM | POA: Diagnosis not present

## 2022-09-04 DIAGNOSIS — F330 Major depressive disorder, recurrent, mild: Secondary | ICD-10-CM | POA: Diagnosis not present

## 2022-09-04 MED ORDER — ARIPIPRAZOLE 10 MG PO TABS
10.0000 mg | ORAL_TABLET | Freq: Every day | ORAL | 0 refills | Status: DC
  Filled 2022-09-04: qty 90, 90d supply, fill #0

## 2022-09-04 MED ORDER — MELOXICAM 15 MG PO TABS
15.0000 mg | ORAL_TABLET | Freq: Every day | ORAL | 1 refills | Status: AC | PRN
  Filled 2022-09-04: qty 45, 45d supply, fill #0

## 2022-09-04 NOTE — Assessment & Plan Note (Signed)
Acutely occurring.  Bursitis appreciated on exam. -Counseled on home exercise therapy and supportive care. -Provided and counseled on compression. -Meloxicam. -Could consider aspiration.

## 2022-09-04 NOTE — Progress Notes (Signed)
Shawn Perez is a 57 y.o. male with the following history as recorded in [employer]:  Patient Active Problem List   Diagnosis Date Noted   S/P right hemicolectomy 12/20/2021   Bilateral femoral hernias s/p lap repair w mesh 04/09/2018 04/09/2018   Right inguinal hernia s/p lap repair w mesh 04/09/2018 04/09/2018   Overweight (BMI 25.0-29.9) 08/08/2017   B12 deficiency 05/02/2016   Generalized anxiety disorder 05/02/2016   Type 2 diabetes mellitus with hyperglycemia, without long-term current use of insulin ([place]) 05/02/2016   Chronic right-sided low back pain without sciatica 05/19/2015   Sleep apnea 03/02/2015   Vitamin D deficiency 07/02/2014   Elevated platelet count 07/02/2014   Family history of premature CAD 04/03/2014   Open-angle glaucoma 09/02/2013   Hyperlipidemia LDL goal <70 07/22/2013   Anxiety 03/14/2012   GERD (gastroesophageal reflux disease) 03/14/2012   Essential hypertension 07/11/2011    Current Outpatient Medications  Medication Sig Dispense Refill   ARIPiprazole (ABILIFY) 10 MG tablet Take 1 tablet (10 mg total) by mouth daily. 30 tablet 1   ARIPiprazole (ABILIFY) 10 MG tablet Take 1 tablet (10 mg total) by mouth daily. 90 tablet 0   ARIPiprazole (ABILIFY) 10 MG tablet Take 1 tablet (10 mg total) by mouth daily. 90 tablet 0   Blood Glucose Monitoring Suppl (CONTOUR NEXT EZ) w/Device KIT Use daily to check blood sugars 1 kit 0   empagliflozin (JARDIANCE) 25 MG TABS tablet Take 1 tablet (25 mg total) by mouth daily. 90 tablet 2   glucose blood (CONTOUR NEXT TEST) test strip Use daily to check blood sugar. 100 each 12   latanoprost (XALATAN) 0.005 % ophthalmic solution Place 1 drop into both eyes at bedtime.      losartan (COZAAR) 50 MG tablet Take 1 tablet (50 mg total) by mouth daily. 90 tablet 2   metFORMIN (GLUCOPHAGE-XR) 500 MG 24 hr tablet Take 2 tablets (1,000 mg total) by mouth daily with breakfast. 180 tablet 2   Microlet Lancets MISC Use daily to check  blood sugar. 100 each 3   omega-3 acid ethyl esters (LOVAZA) 1 g capsule Take 2 capsules (2 g total) by mouth 2 (two) times daily. 120 capsule 2   pioglitazone (ACTOS) 45 MG tablet Take 1 tablet (45 mg total) by mouth daily. 30 tablet 3   rosuvastatin (CRESTOR) 40 MG tablet Take 1 tablet (40 mg total) by mouth daily. 90 tablet 3   meloxicam (MOBIC) 15 MG tablet Take 1 tablet (15 mg total) by mouth daily as needed. 45 tablet 1   No current facility-administered medications for this visit.    Allergies: Fenofibrate and Penicillins  Past Medical History:  Diagnosis Date   Anxiety    Diabetes mellitus without complication ([place])    type 2   Family history of adverse reaction to anesthesia    [name] has trouble waking up both times under anesthesia diagnosed with sleep apnea   GERD (gastroesophageal reflux disease)    "had 4-5 years ago"   Heart murmur    since birth   Hyperlipidemia 2011   Hypertension    Sleep apnea    wears cpap   Temporomandibular jaw dysfunction     Past Surgical History:  Procedure Laterality Date   COLONOSCOPY  1983   COLONOSCOPY WITH PROPOFOL  10/24/2021   INGUINAL HERNIA REPAIR N/A 04/09/2018   Procedure: LAPAROSCOPIC RIGHT INGUINAL AND BILATERAL FEMORAL HERNIA REPAIR;  Surgeon: Michael Boston, MD;  Location: [facility];  Service: General;  Laterality: N/A;   INSERTION OF MESH N/A 04/09/2018   Procedure: INSERTION OF MESH;  Surgeon: Michael Boston, MD;  Location: [facility];  Service: General;  Laterality: N/A;   LAPAROSCOPIC RIGHT HEMI COLECTOMY Right 12/20/2021   Procedure: LAPAROSCOPIC RIGHT HEMI COLECTOMY;  Surgeon: Ileana Roup, MD;  Location: [facility];  Service: General;  Laterality: Right;   NO PAST SURGERIES  07/09/2011   Denies surgical history   WISDOM TOOTH EXTRACTION      Family History  Problem Relation Age of Onset   Hypertension Mother    Diabetes Mother    Early death Mother    Heart disease Mother    Colon cancer Father    Heart disease  Sister    Early death Brother    Heart disease Brother    Hyperlipidemia Brother    Hypertension Brother    Stomach cancer Brother    Heart disease Brother    Early death Maternal Aunt    Heart disease Maternal Aunt    Early death Maternal Uncle    Heart disease Maternal Uncle    Heart disease Maternal Grandmother    Early death Maternal Grandmother    Esophageal cancer Maternal Grandfather    Rectal cancer Neg Hx     Social History   Tobacco Use   Smoking status: Former    Packs/day: 1.00    Years: 17.00    Total pack years: 17.00    Types: Cigarettes    Quit date: 2000    Years since quitting: 24.0    Passive exposure: Current ("daughter smokes")   Smokeless tobacco: Never   Tobacco comments:    quit in 2000 smike 1 ppd  Substance Use Topics   Alcohol use: Yes    Alcohol/week: 7.0 standard drinks of alcohol    Types: 7 Standard drinks or equivalent per week    Comment: daily-beer    Subjective:  2 day history of pain/ swelling over right elbow; no specific injury but 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  Head: Normocephalic and atraumatic  Lungs: Respirations unlabored;  Musculoskeletal: No deformities; swelling noted over right elbow Extremities: No edema, cyanosis, clubbing  Vessels: Symmetric bilaterally  Neurologic: Alert and oriented; speech intact; face symmetrical; moves all extremities well; CNII-XII intact without focal deficit   Assessment:  1. Olecranon bursitis of right elbow     Plan:  Patient is referred to sports medicine today for immediate evaluation and treatment; he will follow up with his PCP for further questions or concerns.   No follow-ups on file.  No orders  of the defined types were placed in this encounter.   Requested Prescriptions    No prescriptions requested or ordered in this encounter

## 2022-09-04 NOTE — Patient Instructions (Signed)
Nice to meet you Please try heat  Please try compression  Please avoid resting your elbow on hard surfaces  Please send me a message in MyChart with any questions or updates.  Please see me back in 4 weeks or as needed if better.   --Dr. Raeford Razor

## 2022-09-04 NOTE — Progress Notes (Signed)
  SHAQUILE Perez - 57 y.o. male MRN [id number]  Date of birth: 1966-06-04  SUBJECTIVE:  Including CC & ROS.  No chief complaint on file.   Shawn Perez is a 57 y.o. male that is presenting with right elbow bursitis.  The bursitis has been present for couple of days.  Denies any specific injury.  No history of similar problems.  Has normal range of motion.  No history of surgery.  No redness or pain.   Review of Systems See HPI   HISTORY: Past Medical, Surgical, Social, and Family History Reviewed & Updated per EMR.   Pertinent Historical Findings include:  Past Medical History:  Diagnosis Date   Anxiety    Diabetes mellitus without complication ([place])    type 2   Family history of adverse reaction to anesthesia    Grandson has trouble waking up both times under anesthesia diagnosed with sleep apnea   GERD (gastroesophageal reflux disease)    "had 4-5 years ago"   Heart murmur    since birth   Hyperlipidemia 2011   Hypertension    Sleep apnea    wears cpap   Temporomandibular jaw dysfunction     Past Surgical History:  Procedure Laterality Date   COLONOSCOPY  1983   COLONOSCOPY WITH PROPOFOL  10/24/2021   INGUINAL HERNIA REPAIR N/A 04/09/2018   Procedure: LAPAROSCOPIC RIGHT INGUINAL AND BILATERAL FEMORAL HERNIA REPAIR;  Surgeon: Michael Boston, MD;  [facility];  Service: General;  Laterality: N/A;   INSERTION OF MESH N/A 04/09/2018   Procedure: INSERTION OF MESH;  Surgeon: Michael Boston, MD;  Location: [facility];  Service: General;  Laterality: N/A;   LAPAROSCOPIC RIGHT HEMI COLECTOMY Right 12/20/2021   Procedure: LAPAROSCOPIC RIGHT HEMI COLECTOMY;  Surgeon: Ileana Roup, MD;  Location: [facility];  Service: General;  Laterality: Right;   NO PAST SURGERIES  07/09/2011   Denies surgical history   WISDOM TOOTH EXTRACTION       PHYSICAL EXAM:  VS: BP 122/68   Ht '5\' 8"'$  (1.727 m)   Wt 185 lb (83.9 kg)   BMI 28.13 kg/m  Physical Exam Gen: NAD, alert, cooperative  with exam, well-appearing MSK:  Neurovascularly intact       ASSESSMENT & PLAN:   Olecranon bursitis of right elbow Acutely occurring.  Bursitis appreciated on exam. -Counseled on home exercise therapy and supportive care. -Provided and counseled on compression. -Meloxicam. -Could consider aspiration.

## 2022-09-05 IMAGING — CT CT CHEST-ABD-PELV W/ CM
2 of 5 series · 13 of 36 positions shown, 15 images · IV contrast (agent unspecified)
Comparison: Chest CT April 14, 2021

CLINICAL DATA: Cecal mass, staging.

EXAM:
CT CHEST, ABDOMEN, AND PELVIS WITH CONTRAST
TECHNIQUE: Multidetector CT imaging of the chest, abdomen and pelvis was
performed following the standard protocol during bolus
administration of intravenous contrast.

[Series 2: cap with · axial · 0.82mm/px · z∈[-436,+119]mm · 10 of 137 slices shown, 12 images]
[im 13/137  mediastinal]
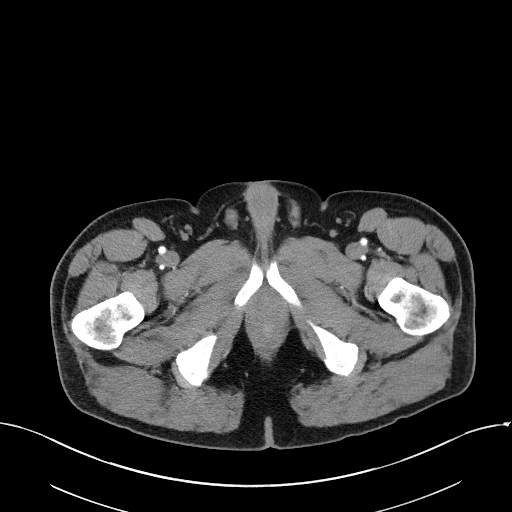
[im 13/137  bone]
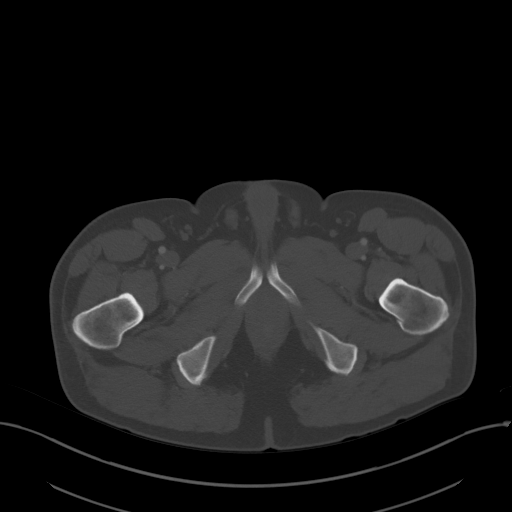
[im 25/137  mediastinal]
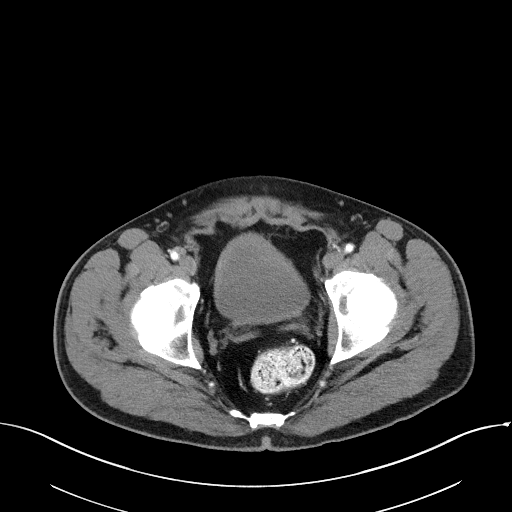
[im 38/137  mediastinal]
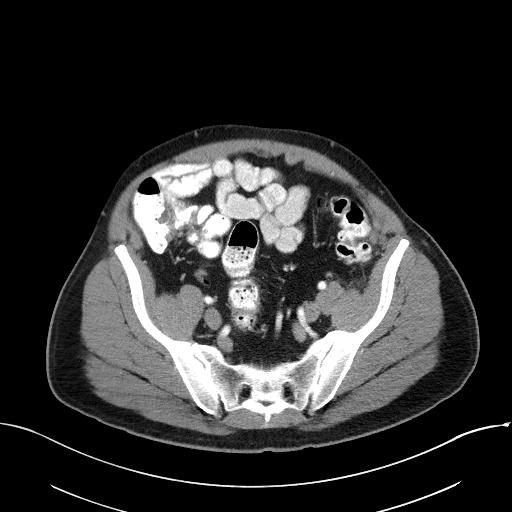
[im 50/137  mediastinal]
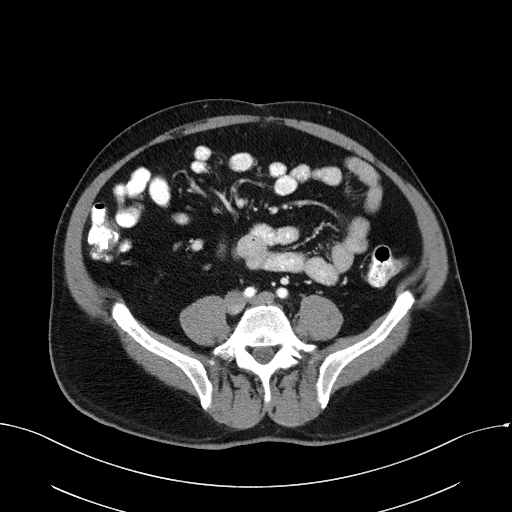
[im 62/137  mediastinal]
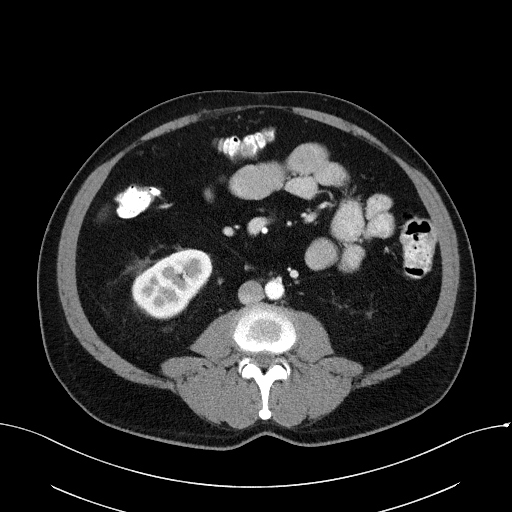
[im 75/137  mediastinal]
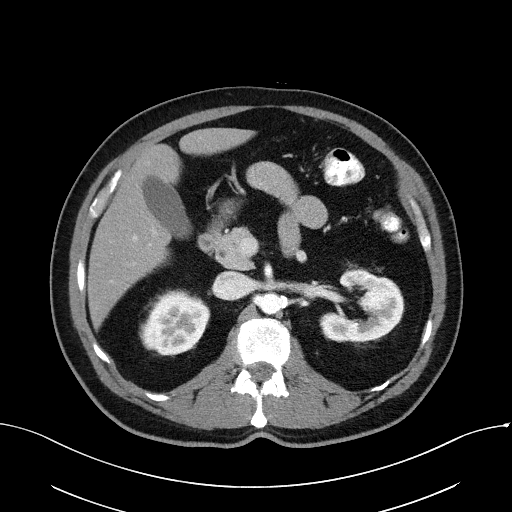
[im 87/137  mediastinal]
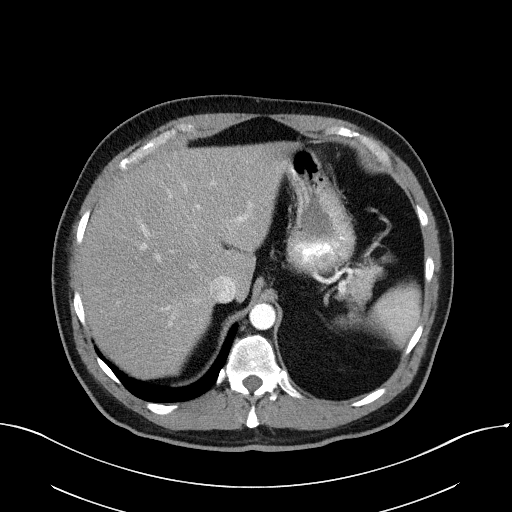
[im 99/137  mediastinal]
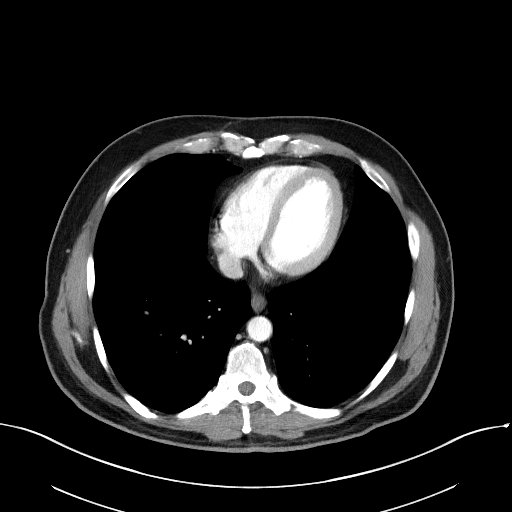
[im 112/137  mediastinal]
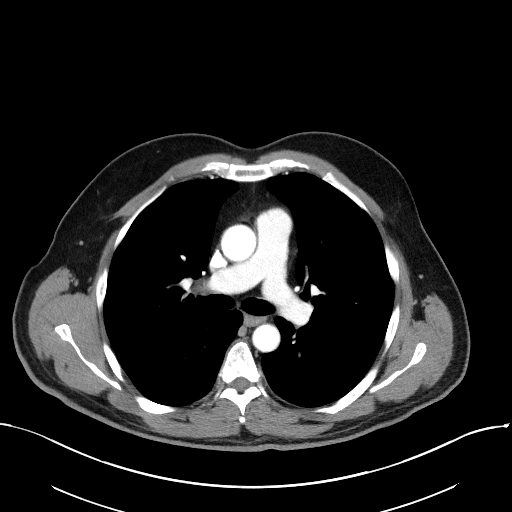
[im 112/137  bone]
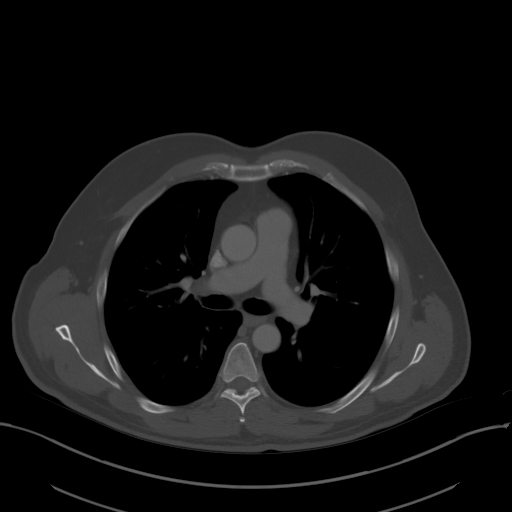
[im 124/137  mediastinal]
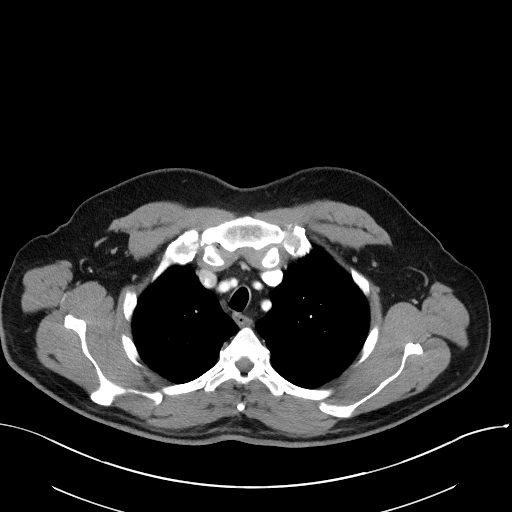

[Series 5: coronals · coronal · 0.87mm/px · 3 of 152 slices shown]
[im 31/152  mediastinal]
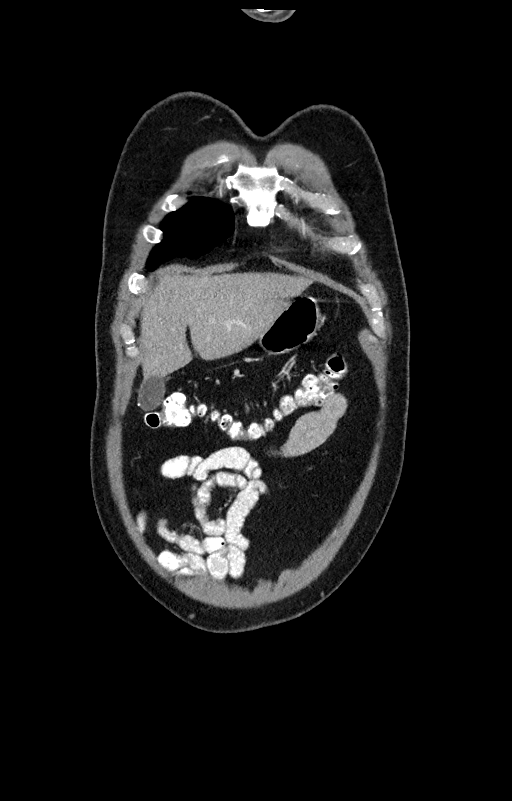
[im 61/152  mediastinal]
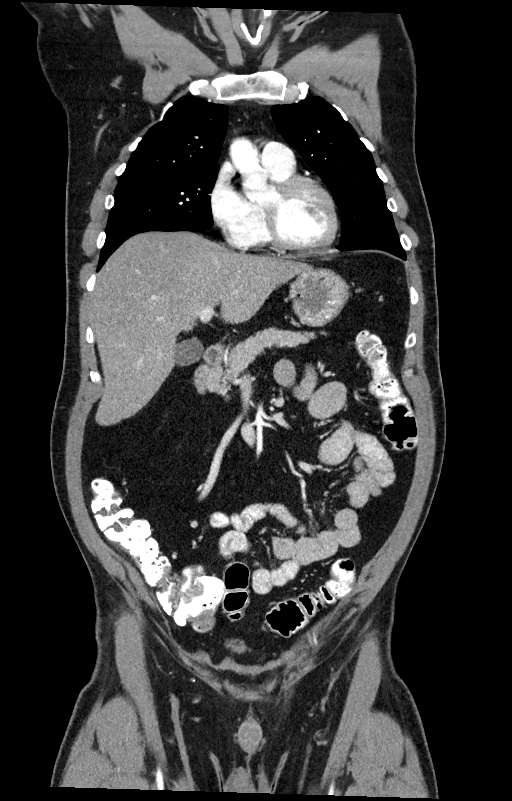
[im 91/152  mediastinal]
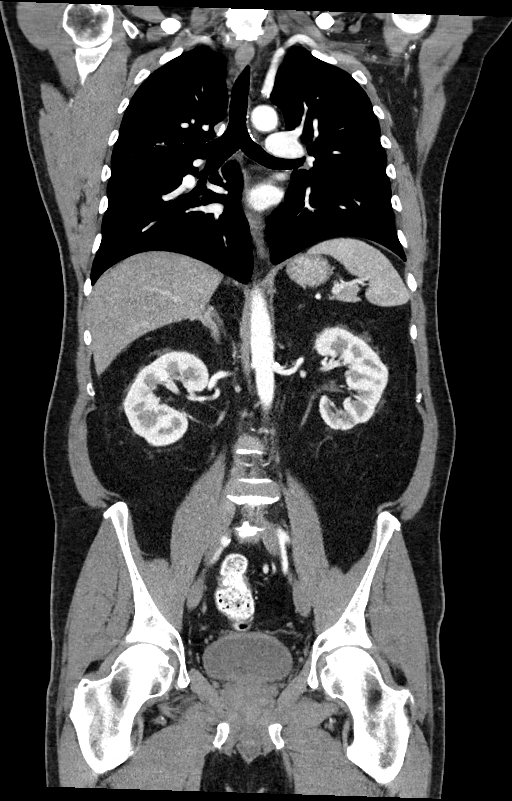

[13 of 36 positions shown; findings below may reference images not displayed]

RADIATION DOSE REDUCTION: This exam was performed according to the
departmental dose-optimization program which includes automated
exposure control, adjustment of the mA and/or kV according to
patient size and/or use of iterative reconstruction technique.

CONTRAST:  100mL OMNIPAQUE IOHEXOL 300 MG/ML  SOLN
FINDINGS: CT CHEST FINDINGS

Cardiovascular: Normal caliber thoracic aorta. No central pulmonary
embolus on this nondedicated study. Normal size heart. No
significant pericardial effusion/thickening. Scattered LAD
calcifications.

Mediastinum/Nodes: No supraclavicular adenopathy. No discrete
thyroid nodule. No pathologically enlarged mediastinal hilar or
axillary lymph nodes. The trachea esophagus are grossly
unremarkable.

Lungs/Pleura: Similar mild biapical pleuroparenchymal scarring. No
suspicious pulmonary nodules or masses. No pleural effusion. No
pneumothorax.

Musculoskeletal: Thoracic spondylosis. No aggressive lytic or
blastic lesion of bone.

CT ABDOMEN PELVIS FINDINGS

Hepatobiliary: Probable hepatic steatosis. No suspicious hepatic
lesion. Gallbladder is unremarkable. No biliary ductal dilation.

Pancreas: No pancreatic ductal dilation or evidence of acute
inflammation.

Spleen: Calcified splenic granuloma.  No splenomegaly.

Adrenals/Urinary Tract: Bilateral adrenal glands appear normal. No
hydronephrosis. Kidneys demonstrate symmetric enhancement and
excretion of contrast material. Left lower pole renal scarring. No
solid enhancing renal mass. Mild wall thickening of an incompletely
distended urinary bladder.

Stomach/Bowel: Radiopaque enteric contrast material traverses the
rectum. Stomach is unremarkable for degree of distension. No
pathologic dilation of small or large bowel. Normal appendix.

Endophytic cecal mass measures 3.6 x 2.6 x 2.7 cm on images 102/2
and 57/5.

Vascular/Lymphatic: Aortic atherosclerosis without abdominal aortic
aneurysm. No pathologically enlarged abdominal or pelvic lymph
nodes.

Reproductive: Prostate is unremarkable.

Other: No significant abdominopelvic free fluid. No discrete
peritoneal or omental nodularity.

Musculoskeletal: Multilevel degenerative changes spine. No
aggressive lytic or blastic lesion of bone.
IMPRESSION: 1. Endophytic 3.6 cm cecal mass.
2. No evidence of metastatic disease within the chest, abdomen, or
pelvis.
3. Probable hepatic steatosis.
4. Mild wall thickening of an incompletely distended urinary
bladder, which may represent cystitis. Correlate with urinalysis.
5.  Aortic Atherosclerosis ([id number]).

## 2022-09-19 ENCOUNTER — Ambulatory Visit: Payer: BC Managed Care – PPO | Admitting: Family Medicine

## 2022-09-19 ENCOUNTER — Encounter: Payer: Self-pay | Admitting: Family Medicine

## 2022-09-19 VITALS — BP 122/64 | HR 71 | Temp 98.0°F | Resp 16 | Ht 68.0 in | Wt 191.2 lb

## 2022-09-19 DIAGNOSIS — R600 Localized edema: Secondary | ICD-10-CM | POA: Diagnosis not present

## 2022-09-19 NOTE — Patient Instructions (Addendum)
For the swelling in your lower extremities, be sure to elevate your legs when able, mind the salt intake, stay physically active and consider wearing compression stockings.  Schedule your eye exam at your convenience.   Let us know if you need anything.

## 2022-09-19 NOTE — Progress Notes (Signed)
Chief Complaint  Patient presents with   Joint Swelling    Here for joint swelling    Janan Ridge here for bilateral leg swelling.  Duration: 1  weeks Hx of prolonged bedrest, recent surgery, travel or injury? Yes Pain the calf? No SOB? No Personal or family history of clot or bleeding disorder? No Hx of heart failure, renal failure, hepatic failure? No Cough? No No recent med changes  Past Medical History:  Diagnosis Date   Anxiety    Diabetes mellitus without complication ([place])    type 2   Family history of adverse reaction to anesthesia    Grandson has trouble waking up both times under anesthesia diagnosed with sleep apnea   GERD (gastroesophageal reflux disease)    "had 4-5 years ago"   Heart murmur    since birth   Hyperlipidemia 2011   Hypertension    Sleep apnea    wears cpap   Temporomandibular jaw dysfunction    Family History  Problem Relation Age of Onset   Hypertension Mother    Diabetes Mother    Early death Mother    Heart disease Mother    Colon cancer Father    Heart disease Sister    Early death Brother    Heart disease Brother    Hyperlipidemia Brother    Hypertension Brother    Stomach cancer Brother    Heart disease Brother    Early death Maternal Aunt    Heart disease Maternal Aunt    Early death Maternal Uncle    Heart disease Maternal Uncle    Heart disease Maternal Grandmother    Early death Maternal Grandmother    Esophageal cancer Maternal Grandfather    Rectal cancer Neg Hx    Past Surgical History:  Procedure Laterality Date   COLONOSCOPY  1983   COLONOSCOPY WITH PROPOFOL  10/24/2021   INGUINAL HERNIA REPAIR N/A 04/09/2018   Procedure: LAPAROSCOPIC RIGHT INGUINAL AND BILATERAL FEMORAL HERNIA REPAIR;  Surgeon: Michael Boston, MD;  Location: [facility];  Service: General;  Laterality: N/A;   INSERTION OF MESH N/A 04/09/2018   Procedure: INSERTION OF MESH;  Surgeon: Michael Boston, MD;  Location: [facility];  Service: General;   Laterality: N/A;   LAPAROSCOPIC RIGHT HEMI COLECTOMY Right 12/20/2021   Procedure: LAPAROSCOPIC RIGHT HEMI COLECTOMY;  Surgeon: Ileana Roup, MD;  Location: [facility];  Service: General;  Laterality: Right;   NO PAST SURGERIES  07/09/2011   Denies surgical history   WISDOM TOOTH EXTRACTION      Current Outpatient Medications:    ARIPiprazole (ABILIFY) 10 MG tablet, Take 1 tablet (10 mg total) by mouth daily., Disp: 30 tablet, Rfl: 1   ARIPiprazole (ABILIFY) 10 MG tablet, Take 1 tablet (10 mg total) by mouth daily., Disp: 90 tablet, Rfl: 0   ARIPiprazole (ABILIFY) 10 MG tablet, Take 1 tablet (10 mg total) by mouth daily., Disp: 90 tablet, Rfl: 0   Blood Glucose Monitoring Suppl (CONTOUR NEXT EZ) w/Device KIT, Use daily to check blood sugars, Disp: 1 kit, Rfl: 0   empagliflozin (JARDIANCE) 25 MG TABS tablet, Take 1 tablet (25 mg total) by mouth daily., Disp: 90 tablet, Rfl: 2   glucose blood (CONTOUR NEXT TEST) test strip, Use daily to check blood sugar., Disp: 100 each, Rfl: 12   latanoprost (XALATAN) 0.005 % ophthalmic solution, Place 1 drop into both eyes at bedtime. , Disp: , Rfl:    losartan (COZAAR) 50 MG tablet, Take 1 tablet (50  mg total) by mouth daily., Disp: 90 tablet, Rfl: 2   meloxicam (MOBIC) 15 MG tablet, Take 1 tablet (15 mg total) by mouth daily as needed., Disp: 45 tablet, Rfl: 1   metFORMIN (GLUCOPHAGE-XR) 500 MG 24 hr tablet, Take 2 tablets (1,000 mg total) by mouth daily with breakfast., Disp: 180 tablet, Rfl: 2   Microlet Lancets MISC, Use daily to check blood sugar., Disp: 100 each, Rfl: 3   omega-3 acid ethyl esters (LOVAZA) 1 g capsule, Take 2 capsules (2 g total) by mouth 2 (two) times daily., Disp: 120 capsule, Rfl: 2   pioglitazone (ACTOS) 45 MG tablet, Take 1 tablet (45 mg total) by mouth daily., Disp: 30 tablet, Rfl: 3   rosuvastatin (CRESTOR) 40 MG tablet, Take 1 tablet (40 mg total) by mouth daily., Disp: 90 tablet, Rfl: 3  BP 122/64 (BP Location: Right Arm,  Patient Position: Sitting, Cuff Size: Normal)   Pulse 71   Temp 98 F (36.7 C) (Oral)   Resp 16   Ht '5\' 8"'$  (1.727 m)   Wt 191 lb 3.2 oz (86.7 kg)   SpO2 95%   BMI 29.07 kg/m  Gen- awake, alert, appears stated age Heart- RRR, no murmurs, +LE edema b/l tapering at mid tibia Lungs- CTAB, normal effort w/o accessory muscle use MSK- no calf pain b/l Psych: Age appropriate judgment and insight  Bilateral lower extremity edema  Low likelihood for clot given improvement of b/l swelling and lack of calf pain. Breathing and satting well. No tachycardia. Compression stockings, elevate legs, mind salt intake, stay active. Encouraged to sched diabetic eye exam.  F/u prn. Pt voiced understanding and agreement to the plan.  [place], DO 09/19/22  4:05 PM

## 2022-09-24 ENCOUNTER — Ambulatory Visit: Payer: Self-pay

## 2022-09-24 ENCOUNTER — Ambulatory Visit: Payer: BC Managed Care – PPO | Admitting: Family Medicine

## 2022-09-24 ENCOUNTER — Encounter: Payer: Self-pay | Admitting: Family Medicine

## 2022-09-24 ENCOUNTER — Other Ambulatory Visit (HOSPITAL_BASED_OUTPATIENT_CLINIC_OR_DEPARTMENT_OTHER): Payer: Self-pay

## 2022-09-24 VITALS — BP 122/68 | Ht 68.0 in | Wt 189.0 lb

## 2022-09-24 DIAGNOSIS — M7021 Olecranon bursitis, right elbow: Secondary | ICD-10-CM | POA: Diagnosis not present

## 2022-09-24 MED ORDER — CEPHALEXIN 500 MG PO CAPS
500.0000 mg | ORAL_CAPSULE | Freq: Two times a day (BID) | ORAL | 0 refills | Status: DC
  Filled 2022-09-24: qty 6, 3d supply, fill #0

## 2022-09-24 NOTE — Patient Instructions (Signed)
Good to see you Please use heat as needed Please continue compression   Please send me a message in MyChart with any questions or updates.  Please see me back as needed.   --Dr. Raeford Razor

## 2022-09-24 NOTE — Assessment & Plan Note (Signed)
Acutely occurring. Having worsening of his bursitis.  - counseled on home exercise therapy and supportive care - aspiration  - keflex  - could consider lab work

## 2022-09-24 NOTE — Progress Notes (Signed)
  Shawn Perez - 57 y.o. male MRN [id number]  Date of birth: 02/02/1966  SUBJECTIVE:  Including CC & ROS.  No chief complaint on file.   Shawn Perez is a 57 y.o. male that is presenting with worsening of his right elbow bursitis.  Has some redness associated with the area..    Review of Systems See HPI   HISTORY: Past Medical, Surgical, Social, and Family History Reviewed & Updated per EMR.   Pertinent Historical Findings include:  Past Medical History:  Diagnosis Date   Anxiety    Diabetes mellitus without complication ([place])    type 2   Family history of adverse reaction to anesthesia    Grandson has trouble waking up both times under anesthesia diagnosed with sleep apnea   GERD (gastroesophageal reflux disease)    "had 4-5 years ago"   Heart murmur    since birth   Hyperlipidemia 2011   Hypertension    Sleep apnea    wears cpap   Temporomandibular jaw dysfunction     Past Surgical History:  Procedure Laterality Date   COLONOSCOPY  1983   COLONOSCOPY WITH PROPOFOL  10/24/2021   INGUINAL HERNIA REPAIR N/A 04/09/2018   Procedure: LAPAROSCOPIC RIGHT INGUINAL AND BILATERAL FEMORAL HERNIA REPAIR;  Surgeon: Michael Boston, MD;  Location: [facility];  Service: General;  Laterality: N/A;   INSERTION OF MESH N/A 04/09/2018   Procedure: INSERTION OF MESH;  Surgeon: Michael Boston, MD;  Location: [facility];  Service: General;  Laterality: N/A;   LAPAROSCOPIC RIGHT HEMI COLECTOMY Right 12/20/2021   Procedure: LAPAROSCOPIC RIGHT HEMI COLECTOMY;  Surgeon: Ileana Roup, MD;  Location: [facility];  Service: General;  Laterality: Right;   NO PAST SURGERIES  07/09/2011   Denies surgical history   WISDOM TOOTH EXTRACTION       PHYSICAL EXAM:  VS: BP 122/68   Ht '5\' 8"'$  (1.727 m)   Wt 189 lb (85.7 kg)   BMI 28.74 kg/m  Physical Exam Gen: NAD, alert, cooperative with exam, well-appearing MSK:  Neurovascularly intact     Aspiration/Injection Procedure Note Shawn Perez [birth year]  Procedure: Injection and aspiration Indications: right elbow pain  Procedure Details Consent: Risks of procedure as well as the alternatives and risks of each were explained to the (patient/caregiver).  Consent for procedure obtained. Time Out: Verified patient identification, verified procedure, site/side was marked, verified correct patient position, special equipment/implants available, medications/allergies/relevent history reviewed, required imaging and test results available.  Performed.  The area was cleaned with iodine and alcohol swabs.    The right olecranon bursitis was injected using 2 cc of 1% lidocaine on a 22-gauge 1-1/2 inch needle.  An 18-gauge 1-1/2 inch needle was used to achieve aspiration.   Ultrasound was used. Images were obtained in long views showing the injection.    Amount of Fluid Aspirated:  100m Character of Fluid: red colored Fluid was sent for: n/a A sterile dressing was applied.  Patient did tolerate procedure well.      ASSESSMENT & PLAN:   Olecranon bursitis of right elbow Acutely occurring. Having worsening of his bursitis.  - counseled on home exercise therapy and supportive care - aspiration  - keflex  - could consider lab work

## 2022-10-11 ENCOUNTER — Other Ambulatory Visit: Payer: Self-pay | Admitting: Family Medicine

## 2022-10-11 ENCOUNTER — Other Ambulatory Visit (HOSPITAL_BASED_OUTPATIENT_CLINIC_OR_DEPARTMENT_OTHER): Payer: Self-pay

## 2022-10-11 MED ORDER — LOSARTAN POTASSIUM 50 MG PO TABS
50.0000 mg | ORAL_TABLET | Freq: Every day | ORAL | 2 refills | Status: DC
  Filled 2022-10-11: qty 90, 90d supply, fill #0
  Filled 2023-01-10: qty 90, 90d supply, fill #1
  Filled 2023-04-09: qty 90, 90d supply, fill #2

## 2022-10-22 ENCOUNTER — Encounter: Payer: Self-pay | Admitting: Gastroenterology

## 2022-10-24 ENCOUNTER — Other Ambulatory Visit: Payer: Self-pay | Admitting: Family Medicine

## 2022-10-24 ENCOUNTER — Ambulatory Visit: Payer: BC Managed Care – PPO | Admitting: Family Medicine

## 2022-10-24 ENCOUNTER — Encounter: Payer: Self-pay | Admitting: Family Medicine

## 2022-10-24 ENCOUNTER — Other Ambulatory Visit (HOSPITAL_BASED_OUTPATIENT_CLINIC_OR_DEPARTMENT_OTHER): Payer: Self-pay

## 2022-10-24 VITALS — BP 120/70 | HR 75 | Temp 98.4°F | Ht 68.0 in | Wt 180.5 lb

## 2022-10-24 DIAGNOSIS — E782 Mixed hyperlipidemia: Secondary | ICD-10-CM

## 2022-10-24 DIAGNOSIS — E1165 Type 2 diabetes mellitus with hyperglycemia: Secondary | ICD-10-CM | POA: Diagnosis not present

## 2022-10-24 LAB — COMPREHENSIVE METABOLIC PANEL
ALT: 33 U/L (ref 0–53)
AST: 23 U/L (ref 0–37)
Albumin: 4.4 g/dL (ref 3.5–5.2)
Alkaline Phosphatase: 68 U/L (ref 39–117)
BUN: 23 mg/dL (ref 6–23)
CO2: 29 mEq/L (ref 19–32)
Calcium: 9.7 mg/dL (ref 8.4–10.5)
Chloride: 100 mEq/L (ref 96–112)
Creatinine, Ser: 0.86 mg/dL (ref 0.40–1.50)
GFR: 95.27 mL/min (ref >60.00)
Glucose, Bld: 118 mg/dL — ABNORMAL HIGH (ref 70–99)
Potassium: 4.5 mEq/L (ref 3.5–5.1)
Sodium: 139 mEq/L (ref 135–145)
Total Bilirubin: 0.9 mg/dL (ref 0.2–1.2)
Total Protein: 6.8 g/dL (ref 6.0–8.3)

## 2022-10-24 LAB — LDL CHOLESTEROL, DIRECT: Direct LDL: 44 mg/dL

## 2022-10-24 LAB — LIPID PANEL
Cholesterol: 110 mg/dL (ref 0–200)
HDL: 36.2 mg/dL — ABNORMAL LOW (ref >39.00)
NonHDL: 73.59
Total CHOL/HDL Ratio: 3
Triglycerides: 241 mg/dL — ABNORMAL HIGH (ref 0.0–149.0)
VLDL: 48.2 mg/dL — ABNORMAL HIGH (ref 0.0–40.0)

## 2022-10-24 LAB — HEMOGLOBIN A1C: Hgb A1c MFr Bld: 7.9 % — ABNORMAL HIGH (ref 4.6–6.5)

## 2022-10-24 MED ORDER — EZETIMIBE 10 MG PO TABS
10.0000 mg | ORAL_TABLET | Freq: Every day | ORAL | 3 refills | Status: DC
  Filled 2022-10-24: qty 30, 30d supply, fill #0
  Filled 2022-11-20: qty 30, 30d supply, fill #1
  Filled 2022-12-17: qty 30, 30d supply, fill #2
  Filled 2023-01-10 – 2023-01-15 (×2): qty 30, 30d supply, fill #3

## 2022-10-24 NOTE — Progress Notes (Signed)
Subjective:   Chief Complaint  Patient presents with   Follow-up    Shawn Perez is a 57 y.o. male here for follow-up of diabetes.   Ab[place]zer's self monitored glucose range is low-mid 100's.  Patient denies hypoglycemic reactions. He checks his glucose levels 1 time(s) per week. Patient does not require insulin.   Medications include: Actos 45 mg/d, metformin XR 1000 mg/d, Jardiance, 25 mg/d Diet is improved.  Exercise: walking  Hyperlipidemia Patient presents for dyslipidemia follow up. Currently being treated with Crestor 40 mg/d and Lovaza 2 g bid and compliance with treatment thus far has been good w Crestor, 2/14 per week with the Lovaza. He denies myalgias. Diet/exercise as above.  The patient is not known to have coexisting coronary artery disease. No Cp or SOB.   Past Medical History:  Diagnosis Date   Anxiety    Diabetes mellitus without complication ([place])    type 2   Family history of adverse reaction to anesthesia    [name] has trouble waking up both times under anesthesia diagnosed with sleep apnea   GERD (gastroesophageal reflux disease)    "had 4-5 years ago"   Heart murmur    since birth   Hyperlipidemia 2011   Hypertension    Sleep apnea    wears cpap   Temporomandibular jaw dysfunction      Related testing: Retinal exam: Due Pneumovax: done  Objective:  BP 120/70 (BP Location: Left Arm, Patient Position: Sitting, Cuff Size: Normal)   Pulse 75   Temp 98.4 F (36.9 C) (Oral)   Ht '5\' 8"'$  (1.727 m)   Wt 180 lb 8 oz (81.9 kg)   SpO2 98%   BMI 27.44 kg/m  General:  Well developed, well nourished, in no apparent distress Skin:  Warm, no pallor or diaphoresis on exposed skin surfaces Lungs:  CTAB, no access msc use Cardio:  RRR, no bruits, no LE edema Psych: Age appropriate judgment and insight  Assessment:   Type 2 diabetes mellitus with hyperglycemia, without long-term current use of insulin ([facility])  Mixed hyperlipidemia   Plan:   Needs  to schedule eye exam.  This was strongly encouraged to him.  He has an eye doctor and will reach out.  Continue Actos 45 mg daily, metformin XR 1000 mg daily, Jardiance 25 mg daily.  Would consider GLP-1 agonist if still not controlled.  Goal is less than 7.  Continue to monitor sugars at home.  Counseled on diet and exercise. Chronic, hopefully stable.  Continue Crestor 40 mg daily.  Stop Lovaza as it was large for him to swallow and compliance was not good.  Will consider Zetia as he has failed fenofibrate. F/u in 3-6 mo. The patient voiced understanding and agreement to the plan.  [place], DO 10/24/22 7:59 AM

## 2022-10-24 NOTE — Patient Instructions (Signed)
Give [place] 2-3 business days to get the results of your labs back.   Please schedule your eye exam. We like to do this yearly.   Let us know if you need anything.

## 2022-10-26 ENCOUNTER — Encounter: Payer: Self-pay | Admitting: Gastroenterology

## 2022-11-02 ENCOUNTER — Other Ambulatory Visit: Payer: Self-pay | Admitting: Family Medicine

## 2022-11-02 ENCOUNTER — Other Ambulatory Visit (HOSPITAL_BASED_OUTPATIENT_CLINIC_OR_DEPARTMENT_OTHER): Payer: Self-pay

## 2022-11-02 MED ORDER — EMPAGLIFLOZIN 25 MG PO TABS
25.0000 mg | ORAL_TABLET | Freq: Every day | ORAL | 1 refills | Status: DC
  Filled 2022-11-02: qty 90, 90d supply, fill #0
  Filled 2023-01-29: qty 90, 90d supply, fill #1

## 2022-11-20 ENCOUNTER — Other Ambulatory Visit: Payer: Self-pay | Admitting: Family Medicine

## 2022-11-20 ENCOUNTER — Other Ambulatory Visit (HOSPITAL_BASED_OUTPATIENT_CLINIC_OR_DEPARTMENT_OTHER): Payer: Self-pay

## 2022-11-20 MED ORDER — PIOGLITAZONE HCL 45 MG PO TABS
45.0000 mg | ORAL_TABLET | Freq: Every day | ORAL | 3 refills | Status: DC
  Filled 2022-11-20: qty 30, 30d supply, fill #0
  Filled 2022-12-17: qty 30, 30d supply, fill #1
  Filled 2023-01-10 – 2023-01-14 (×2): qty 30, 30d supply, fill #2
  Filled 2023-02-14: qty 30, 30d supply, fill #3

## 2022-11-27 ENCOUNTER — Other Ambulatory Visit (HOSPITAL_BASED_OUTPATIENT_CLINIC_OR_DEPARTMENT_OTHER): Payer: Self-pay

## 2022-11-27 ENCOUNTER — Ambulatory Visit (AMBULATORY_SURGERY_CENTER): Payer: BC Managed Care – PPO

## 2022-11-27 VITALS — Ht 68.0 in | Wt 180.0 lb

## 2022-11-27 DIAGNOSIS — Z86010 Personal history of colonic polyps: Secondary | ICD-10-CM

## 2022-11-27 DIAGNOSIS — K760 Fatty (change of) liver, not elsewhere classified: Secondary | ICD-10-CM

## 2022-11-27 MED ORDER — NA SULFATE-K SULFATE-MG SULF 17.5-3.13-1.6 GM/177ML PO SOLN
1.0000 | Freq: Once | ORAL | 0 refills | Status: AC
  Filled 2022-11-27: qty 354, 1d supply, fill #0

## 2022-11-27 NOTE — Progress Notes (Signed)

## 2022-12-03 ENCOUNTER — Encounter: Payer: Self-pay | Admitting: Gastroenterology

## 2022-12-05 ENCOUNTER — Other Ambulatory Visit: Payer: BC Managed Care – PPO

## 2022-12-10 ENCOUNTER — Encounter: Payer: Self-pay | Admitting: *Deleted

## 2022-12-13 ENCOUNTER — Other Ambulatory Visit: Payer: BC Managed Care – PPO

## 2022-12-17 ENCOUNTER — Telehealth: Payer: Self-pay

## 2022-12-17 ENCOUNTER — Telehealth: Payer: Self-pay | Admitting: Gastroenterology

## 2022-12-17 ENCOUNTER — Other Ambulatory Visit: Payer: BC Managed Care – PPO

## 2022-12-17 NOTE — Telephone Encounter (Signed)
Lab appt rescheduled 

## 2022-12-17 NOTE — Telephone Encounter (Signed)
Patient called stated he tested positive on Saturday for Covid-19 and started having symptoms last week. He is scheduled for a colonoscopy on 12/19/22 and would like to know if he can still have the procedure. Please advise.

## 2022-12-17 NOTE — Telephone Encounter (Signed)
Called and spoke with patient- patient reports he started having COVID symptoms earlier last week, tested at home and received a positive COVID test on Saturday 04/20, tested at home again today (12/17/2022) with a positive test; patient made request to reschedule colon on 01/10/2023;  patient advised that updated prep instructions would be sent to his MyChart as well as home address; patient confirmed address as is list in the medical record; patient advised to call back to the office or send a MyChart message if any further needs arise;   Side note: patient reports he has already picked up his prep from the pharmacy;

## 2022-12-17 NOTE — Telephone Encounter (Signed)
Pt called after hours, he had an appt today for lab appt, he has tested positive for covid, wanted to know if he should come.   Telephone:[phone] (H

## 2022-12-19 ENCOUNTER — Encounter: Payer: BC Managed Care – PPO | Admitting: Gastroenterology

## 2022-12-21 ENCOUNTER — Other Ambulatory Visit (INDEPENDENT_AMBULATORY_CARE_PROVIDER_SITE_OTHER): Payer: BC Managed Care – PPO

## 2022-12-21 ENCOUNTER — Encounter: Payer: Self-pay | Admitting: Family Medicine

## 2022-12-21 DIAGNOSIS — E782 Mixed hyperlipidemia: Secondary | ICD-10-CM | POA: Diagnosis not present

## 2022-12-21 LAB — LIPID PANEL
Cholesterol: 60 mg/dL (ref 0–200)
HDL: 31.5 mg/dL — ABNORMAL LOW (ref >39.00)
LDL Cholesterol: -3 mg/dL — ABNORMAL LOW (ref 0–99)
NonHDL: 28.56
Total CHOL/HDL Ratio: 2
Triglycerides: 158 mg/dL — ABNORMAL HIGH (ref 0.0–149.0)
VLDL: 31.6 mg/dL (ref 0.0–40.0)

## 2022-12-24 ENCOUNTER — Ambulatory Visit: Payer: BC Managed Care – PPO | Admitting: Family Medicine

## 2022-12-24 ENCOUNTER — Other Ambulatory Visit: Payer: BC Managed Care – PPO

## 2022-12-24 ENCOUNTER — Other Ambulatory Visit (HOSPITAL_BASED_OUTPATIENT_CLINIC_OR_DEPARTMENT_OTHER): Payer: Self-pay

## 2022-12-24 ENCOUNTER — Encounter: Payer: Self-pay | Admitting: Family Medicine

## 2022-12-24 VITALS — BP 132/81 | HR 86 | Temp 97.9°F | Ht 68.0 in | Wt 174.0 lb

## 2022-12-24 DIAGNOSIS — J069 Acute upper respiratory infection, unspecified: Secondary | ICD-10-CM | POA: Diagnosis not present

## 2022-12-24 MED ORDER — DOXYCYCLINE HYCLATE 100 MG PO TABS
100.0000 mg | ORAL_TABLET | Freq: Two times a day (BID) | ORAL | 0 refills | Status: AC
  Filled 2022-12-24: qty 20, 10d supply, fill #0

## 2022-12-24 MED ORDER — LIDOCAINE VISCOUS HCL 2 % MT SOLN
15.0000 mL | OROMUCOSAL | 0 refills | Status: AC | PRN
  Filled 2022-12-24: qty 100, 1d supply, fill #0

## 2022-12-24 NOTE — Progress Notes (Signed)
Acute Office Visit  Subjective:     Patient ID: Shawn Perez, male    DOB: [birth year], 57 y.o.   MRN: [id number]  Chief Complaint  Patient presents with   Sore Throat   Cough    Patient is in today for URI symptoms for 2+ weeks. States he had COVID a few weeks prior but only needed symptom management at home.   Upper Respiratory Infection: Patient complains of symptoms of a URI. Symptoms include  sore throat, rhinorrhea, congestion, sinus pressure, coughing (productive, yellow/green sputum, keeps him from sleeping), mild fatigue . Onset of symptoms was 2 weeks ago, gradually worsening since that time. Marland Kitchen  He is drinking plenty of fluids. Evaluation to date: none. Treatment to date:  Robitussin - minimal improvement . Denies fevers, chills, body aches, chest pain, dyspnea, wheezing, nausea, vomiting, diarrhea.       All review of systems negative except what is listed in the HPI      Objective:    BP 132/81   Pulse 86   Temp 97.9 F (36.6 C) (Oral)   Ht 5\' 8"  (1.727 m)   Wt 174 lb (78.9 kg)   SpO2 97%   BMI 26.46 kg/m    Physical Exam Vitals reviewed.  Constitutional:      General: He is not in acute distress.    Appearance: Normal appearance. He is not ill-appearing.  HENT:     Head: Normocephalic and atraumatic.     Right Ear: Tympanic membrane normal.     Left Ear: Tympanic membrane normal.     Nose: Congestion and rhinorrhea present.     Mouth/Throat:     Pharynx: No oropharyngeal exudate or posterior oropharyngeal erythema.     Comments: Cobblestoning/PND Eyes:     Conjunctiva/sclera: Conjunctivae normal.  Cardiovascular:     Rate and Rhythm: Normal rate and regular rhythm.     Pulses: Normal pulses.     Heart sounds: Normal heart sounds.  Pulmonary:     Effort: Pulmonary effort is normal.     Breath sounds: Normal breath sounds. No wheezing, rhonchi or rales.  Musculoskeletal:     Cervical back: Normal range of motion and neck supple. No  tenderness.  Lymphadenopathy:     Cervical: No cervical adenopathy.  Skin:    General: Skin is warm and dry.  Neurological:     Mental Status: He is alert and oriented to person, place, and time.  Psychiatric:        Mood and Affect: Mood normal.        Behavior: Behavior normal.        Thought Content: Thought content normal.        Judgment: Judgment normal.      No results found for any visits on 12/24/22.      Assessment & Plan:   Problem List Items Addressed This Visit   None Visit Diagnoses     Upper respiratory tract infection, unspecified type    -  Primary Adding Doxycycline given duration of symptoms - take with food and water; add a mid-day probiotic Continue Flonase.  Continue supportive measures including rest, hydration, humidifier use, steam showers, warm compresses to sinuses, warm liquids with lemon and honey, and over-the-counter cough, cold, and analgesics as needed.  Patient aware of signs/symptoms requiring further/urgent evaluation.        Relevant Medications   doxycycline (VIBRA-TABS) 100 MG tablet   lidocaine (XYLOCAINE) 2 % solution  Meds ordered this encounter  Medications   doxycycline (VIBRA-TABS) 100 MG tablet    Sig: Take 1 tablet (100 mg total) by mouth 2 (two) times daily for 10 days.    Dispense:  20 tablet    Refill:  0    Order Specific Question:   Supervising Provider    Answer:   Danise Edge A [[id number]]   lidocaine (XYLOCAINE) 2 % solution    Sig: Use as directed 15 mLs in the mouth or throat every 4 (four) hours as needed for mouth pain.    Dispense:  100 mL    Refill:  0    Order Specific Question:   Supervising Provider    Answer:   Danise Edge A [[id number]]    Return if symptoms worsen or fail to improve.  Clayborne Dana, NP

## 2022-12-24 NOTE — Patient Instructions (Addendum)
Adding Doxycycline given duration of symptoms - take with food and water; add a mid-day probiotic Continue Flonase.  Continue supportive measures including rest, hydration, humidifier use, steam showers, warm compresses to sinuses, warm liquids with lemon and honey, and over-the-counter cough, cold, and analgesics as needed.   Please contact office for follow-up if symptoms do not improve or worsen. Seek emergency care if symptoms become severe.  The following information is provided as a Counsellor for ADULT patients only and does NOT take into account PREGNANCY, ALLERGIES, LIVER CONDITIONS, KIDNEY CONDITIONS, GASTROINTESTINAL CONDITIONS, OR PRESCRIPTION MEDICATION INTERACTIONS. Please be sure to ask your provider if the following are safe to take with your specific medical history, conditions, or current medication regimen if you are unsure.   Adult Basic Symptom Management   Congestion: Guaifenesin (Mucinex)- follow directions on packaging with a maximum dose of 2400mg  in a 24 hour period.  Pain/Fever: Ibuprofen 200mg  - 400mg  every 4-6 hours as needed (MAX 1200mg  in a 24 hour period) Pain/Fever: Tylenol 500mg  -1000mg  every 6-8 hours as needed (MAX 3000mg  in a 24 hour period)  Cough: Dextromethorphan (Delsym)- follow directions on packing with a maximum dose of 120mg  in a 24 hour period.  Nasal Stuffiness: Saline nasal spray and/or Nettie Pot with sterile saline solution  Runny Nose: Fluticasone nasal spray (Flonase) OR Mometasone nasal spray (Nasonex) OR Triamcinolone Acetonide nasal spray (Nasacort)- follow directions on the packaging  Pain/Pressure: Warm washcloth to the face  Sore Throat: Warm salt water gargles  If you have allergies, you may also consider taking an oral antihistamine (like Zyrtec or Claritin) as these may also help with your symptoms.  **Many medications will have more than one ingredient, be sure you are reading the packaging carefully and not taking more  than one dose of the same kind of medication at the same time or too close together. It is OK to use formulas that have all of the ingredients you want, but do not take them in a combined medication and as separate dose too close together. If you have any questions, the pharmacist will be happy to help you decide what is safe.

## 2022-12-27 ENCOUNTER — Other Ambulatory Visit (HOSPITAL_BASED_OUTPATIENT_CLINIC_OR_DEPARTMENT_OTHER): Payer: Self-pay

## 2022-12-27 DIAGNOSIS — F330 Major depressive disorder, recurrent, mild: Secondary | ICD-10-CM | POA: Diagnosis not present

## 2022-12-27 DIAGNOSIS — F411 Generalized anxiety disorder: Secondary | ICD-10-CM | POA: Diagnosis not present

## 2022-12-27 MED ORDER — ARIPIPRAZOLE 10 MG PO TABS
10.0000 mg | ORAL_TABLET | Freq: Every day | ORAL | 0 refills | Status: DC
  Filled 2022-12-27: qty 90, 90d supply, fill #0

## 2023-01-05 ENCOUNTER — Encounter: Payer: Self-pay | Admitting: Certified Registered Nurse Anesthetist

## 2023-01-10 ENCOUNTER — Encounter: Payer: Self-pay | Admitting: Gastroenterology

## 2023-01-10 ENCOUNTER — Other Ambulatory Visit (HOSPITAL_BASED_OUTPATIENT_CLINIC_OR_DEPARTMENT_OTHER): Payer: Self-pay

## 2023-01-10 ENCOUNTER — Ambulatory Visit (AMBULATORY_SURGERY_CENTER): Payer: BC Managed Care – PPO | Admitting: Gastroenterology

## 2023-01-10 VITALS — BP 111/56 | HR 82 | Temp 98.2°F | Resp 18 | Ht 68.0 in | Wt 180.0 lb

## 2023-01-10 DIAGNOSIS — Z09 Encounter for follow-up examination after completed treatment for conditions other than malignant neoplasm: Secondary | ICD-10-CM | POA: Diagnosis not present

## 2023-01-10 DIAGNOSIS — Z86010 Personal history of colonic polyps: Secondary | ICD-10-CM

## 2023-01-10 DIAGNOSIS — K640 First degree hemorrhoids: Secondary | ICD-10-CM

## 2023-01-10 DIAGNOSIS — Z1211 Encounter for screening for malignant neoplasm of colon: Secondary | ICD-10-CM | POA: Diagnosis not present

## 2023-01-10 DIAGNOSIS — D123 Benign neoplasm of transverse colon: Secondary | ICD-10-CM

## 2023-01-10 MED ORDER — SODIUM CHLORIDE 0.9 % IV SOLN: 500.0000 mL | Freq: Once | INTRAVENOUS | Status: DC

## 2023-01-10 NOTE — Patient Instructions (Addendum)
-   Resume previous diet. - Continue present medications. - Await pathology results. - Repeat colonoscopy for surveillance based on pathology results. - Return to GI clinic PRN.  YOU HAD AN ENDOSCOPIC PROCEDURE TODAY AT THE  ENDOSCOPY CENTER:   Refer to the procedure report that was given to you for any specific questions about what was found during the examination.  If the procedure report does not answer your questions, please call your gastroenterologist to clarify.  If you requested that your care partner not be given the details of your procedure findings, then the procedure report has been included in a sealed envelope for you to review at your convenience later.  YOU SHOULD EXPECT: Some feelings of bloating in the abdomen. Passage of more gas than usual.  Walking can help get rid of the air that was put into your GI tract during the procedure and reduce the bloating. If you had a lower endoscopy (such as a colonoscopy or flexible sigmoidoscopy) you may notice spotting of blood in your stool or on the toilet paper. If you underwent a bowel prep for your procedure, you may not have a normal bowel movement for a few days.  Please Note:  You might notice some irritation and congestion in your nose or some drainage.  This is from the oxygen used during your procedure.  There is no need for concern and it should clear up in a day or so.  SYMPTOMS TO REPORT IMMEDIATELY:  Following lower endoscopy (colonoscopy or flexible sigmoidoscopy):  Excessive amounts of blood in the stool  Significant tenderness or worsening of abdominal pains  Swelling of the abdomen that is new, acute  Fever of 100F or higher  For urgent or emergent issues, a gastroenterologist can be reached at any hour by calling [phone]. Do not use MyChart messaging for urgent concerns.    DIET:  We do recommend a small meal at first, but then you may proceed to your regular diet.  Drink plenty of fluids but you should  avoid alcoholic beverages for 24 hours.  ACTIVITY:  You should plan to take it easy for the rest of today and you should NOT DRIVE or use heavy machinery until tomorrow (because of the sedation medicines used during the test).    FOLLOW UP: Our staff will call the number listed on your records the next business day following your procedure.  We will call around 7:15- 8:00 am to check on you and address any questions or concerns that you may have regarding the information given to you following your procedure. If we do not reach you, we will leave a message.     If any biopsies were taken you will be contacted by phone or by letter within the next 1-3 weeks.  Please call us at [phone] if you have not heard about the biopsies in 3 weeks.    SIGNATURES/CONFIDENTIALITY: You and/or your care partner have signed paperwork which will be entered into your electronic medical record.  These signatures attest to the fact that that the information above on your After Visit Summary has been reviewed and is understood.  Full responsibility of the confidentiality of this discharge information lies with you and/or your care-partner.

## 2023-01-10 NOTE — Op Note (Signed)
[facility] Patient Name: Shawn Perez Procedure Date: 01/10/2023 2:41 PM MRN: [id number] Endoscopist: Doristine Locks , MD, [id number] Age: 57 Referring MD:  Date of Birth: [birth year] Gender: Male Account #: [id number] Procedure:                Colonoscopy Indications:              High risk colon cancer surveillance: Personal                            history of adenoma with villous component                           -Colonoscopy (10/24/2021): Large villous mass in the                            cecum and proximal ascending colon obscuring                            ileocecal valve, tattoo placed distally and                            biopsied (path: Adenoma without clear carcinoma).                            An additional 5 mm polyp and 15 mm flat laterally                            spreading polyp were also noted in the proximal                            ascending colon. These fell within the tattoo field                            and will be resected surgically. 2 subcentimeter                            descending colon adenomatous, 5 mm sigmoid adenoma.                            Sigmoid diverticulosis, internal hemorrhoids                           -CEA <2                           -CT CAP (11/09/2021): Hepatic steatosis, 3.6 x 2.6 x                            2.7 cm cecal mass without adenopathy or evidence of                            metastasis                           -12/20/2021: Right hemicolectomy. Path: Large  TVA at                            IC valve measuring 6.2 x 3.2 x 1.9 cm. Margins                            free. 30 benign pericolic lymph nodes. Benign                            appendix Medicines:                Monitored Anesthesia Care Procedure:                Pre-Anesthesia Assessment:                           - Prior to the procedure, a History and Physical                            was performed, and patient medications and                             allergies were reviewed. The patient's tolerance of                            previous anesthesia was also reviewed. The risks                            and benefits of the procedure and the sedation                            options and risks were discussed with the patient.                            All questions were answered, and informed consent                            was obtained. Prior Anticoagulants: The patient has                            taken no anticoagulant or antiplatelet agents. ASA                            Grade Assessment: II - A patient with mild systemic                            disease. After reviewing the risks and benefits,                            the patient was deemed in satisfactory condition to                            undergo the procedure.  After obtaining informed consent, the colonoscope                            was passed under direct vision. Throughout the                            procedure, the patient's blood pressure, pulse, and                            oxygen saturations were monitored continuously. The                            CF [id number] #[id number] was introduced through the anus                            and advanced to the the ileocolonic anastomosis.                            The colonoscopy was performed without difficulty.                            The patient tolerated the procedure well. The                            quality of the bowel preparation was good. The                            rectum, ileocolonic anastamosis and neo-terminal                            ileum were photographed. Scope In: 2:54:57 PM Scope Out: 3:09:23 PM Scope Withdrawal Time: 0 hours 8 minutes 0 seconds  Total Procedure Duration: 0 hours 14 minutes 26 seconds  Findings:                 The perianal and digital rectal examinations were                            normal.                           A 6  mm polyp was found in the transverse colon. The                            polyp was sessile. The polyp was removed with a                            cold snare. Resection and retrieval were complete.                            Estimated blood loss was minimal.                           There was evidence of a prior end-to-side  ileo-colonic anastomosis in the transverse colon.                            This was patent and was characterized by healthy                            appearing mucosa. The remainder of the colon was                            normal appearing.                           Non-bleeding internal hemorrhoids were found during                            retroflexion. The hemorrhoids were small. Complications:            No immediate complications. Estimated Blood Loss:     Estimated blood loss was minimal. Impression:               - One 6 mm polyp in the transverse colon, removed                            with a cold snare. Resected and retrieved.                           - Patent end-to-side ileo-colonic anastomosis,                            characterized by healthy appearing mucosa.                           - Non-bleeding internal hemorrhoids. Recommendation:           - Patient has a contact number available for                            emergencies. The signs and symptoms of potential                            delayed complications were discussed with the                            patient. Return to normal activities tomorrow.                            Written discharge instructions were provided to the                            patient.                           - Resume previous diet.                           - Continue present medications.                           -  Await pathology results.                           - Repeat colonoscopy for surveillance based on                            pathology results.                            - Return to GI clinic PRN. Doristine Locks, MD 01/10/2023 3:17:32 PM

## 2023-01-10 NOTE — Progress Notes (Signed)
Report given to PACU, vss 

## 2023-01-10 NOTE — Progress Notes (Signed)
GASTROENTEROLOGY PROCEDURE H&P NOTE   Primary Care Physician: Sharlene Dory, DO    Reason for Procedure:   Colon polyp surveillance  Plan:    Colonoscopy  Patient is appropriate for endoscopic procedure(s) in the ambulatory ([facility]) setting.  The nature of the procedure, as well as the risks, benefits, and alternatives were carefully and thoroughly reviewed with the patient. Ample time for discussion and questions allowed. The patient understood, was satisfied, and agreed to proceed.     HPI: Shawn Perez is a 57 y.o. male who presents for Colonoscopy for ongoing colon polyp surveillance.   History of large, endoscopically unresectable advanced colon polyp found on index colonoscopy in 09/2021.  Family history notable for maternal uncle with CRC.   - Colonoscopy (10/24/2021): Large villous mass in the cecum and proximal ascending colon obscuring ileocecal valve, tattoo placed distally and biopsied (path: Adenoma without clear carcinoma).  An additional 5 mm polyp and 15 mm flat laterally spreading polyp were also noted in the proximal ascending colon.  These fell within the tattoo field and will be resected surgically.  2 subcentimeter descending colon adenomatous, 5 mm sigmoid adenoma.  Sigmoid diverticulosis, internal hemorrhoids - CEA <2 - 11/03/2021: Evaluation in Colorectal Surgery clinic by Dr. Cliffton Perez with plan for right hemicolectomy for endoscopically unresectable polyp of the cecum - CT CAP (11/09/2021): Hepatic steatosis, 3.6 x 2.6 x 2.7 cm cecal mass without adenopathy or evidence of metastasis - 12/20/2021: Right hemicolectomy.  Path: Large TVA at IC valve measuring 6.2 x 3.2 x 1.9 cm.  Margins free.  30 benign pericolic lymph nodes.  Benign appendix   Past Medical History:  Diagnosis Date   Anxiety    Diabetes mellitus without complication (HCC)    type 2   Family history of adverse reaction to anesthesia    Shawn Perez has trouble waking up both times under  anesthesia diagnosed with sleep apnea   GERD (gastroesophageal reflux disease)    "had 4-5 years ago"   Heart murmur    since birth   Hyperlipidemia 2011   Hypertension    Sleep apnea    wears cpap   Temporomandibular jaw dysfunction     Past Surgical History:  Procedure Laterality Date   COLONOSCOPY  1983   COLONOSCOPY WITH PROPOFOL  10/24/2021   INGUINAL HERNIA REPAIR N/A 04/09/2018   Procedure: LAPAROSCOPIC RIGHT INGUINAL AND BILATERAL FEMORAL HERNIA REPAIR;  Surgeon: Shawn Soda, MD;  Location: [facility];  Service: General;  Laterality: N/A;   INSERTION OF MESH N/A 04/09/2018   Procedure: INSERTION OF MESH;  Surgeon: Shawn Soda, MD;  Location: [facility];  Service: General;  Laterality: N/A;   LAPAROSCOPIC RIGHT HEMI COLECTOMY Right 12/20/2021   Procedure: LAPAROSCOPIC RIGHT HEMI COLECTOMY;  Surgeon: Shawn Meuse, MD;  Location: [facility];  Service: General;  Laterality: Right;   NO PAST SURGERIES  07/09/2011   Denies surgical history   WISDOM TOOTH EXTRACTION      Prior to Admission medications   Medication Sig Start Date End Date Taking? Authorizing Provider  ARIPiprazole (ABILIFY) 10 MG tablet Take 1 tablet (10 mg total) by mouth daily. Patient taking differently: Take 20 mg by mouth daily. 09/04/22  Yes   ARIPiprazole (ABILIFY) 10 MG tablet Take 1 tablet (10 mg total) by mouth daily. 12/27/22  Yes   Blood Glucose Monitoring Suppl (CONTOUR NEXT EZ) w/Device KIT Use daily to check blood sugars 07/23/22  Yes Shawn Perez, Shawn Roche, DO  empagliflozin (JARDIANCE) 25 MG  TABS tablet Take 1 tablet (25 mg total) by mouth daily. 11/02/22  Yes Sharlene Dory, DO  ezetimibe (ZETIA) 10 MG tablet Take 1 tablet (10 mg total) by mouth daily. 10/24/22  Yes Shawn Perez, Shawn Roche, DO  glucose blood (CONTOUR NEXT TEST) test strip Use daily to check blood sugar. 07/23/22  Yes Shawn Perez, Shawn Roche, DO  losartan (COZAAR) 50 MG tablet Take 1 tablet (50 mg total) by mouth daily. 10/11/22   Yes Sharlene Dory, DO  metFORMIN (GLUCOPHAGE-XR) 500 MG 24 hr tablet Take 2 tablets (1,000 mg total) by mouth daily with breakfast. 04/13/22  Yes Shawn Perez, Shawn Roche, DO  Microlet Lancets MISC Use daily to check blood sugar. 07/23/22  Yes Sharlene Dory, DO  pioglitazone (ACTOS) 45 MG tablet Take 1 tablet (45 mg total) by mouth daily. 11/20/22  Yes Sharlene Dory, DO  rosuvastatin (CRESTOR) 40 MG tablet Take 1 tablet (40 mg total) by mouth daily. 07/23/22  Yes Shawn Perez, Shawn Roche, DO  latanoprost (XALATAN) 0.005 % ophthalmic solution Place 1 drop into both eyes at bedtime.  11/29/11   [provider]  lidocaine (XYLOCAINE) 2 % solution Use as directed 15 mLs in the mouth or throat every 4 (four) hours as needed for mouth pain. Patient not taking: Reported on 01/10/2023 12/24/22   Hyman Hopes B, NP  meloxicam (MOBIC) 15 MG tablet Take 1 tablet (15 mg total) by mouth daily as needed. Patient not taking: Reported on 01/10/2023 09/04/22   Myra Rude, MD    Current Outpatient Medications  Medication Sig Dispense Refill   ARIPiprazole (ABILIFY) 10 MG tablet Take 1 tablet (10 mg total) by mouth daily. (Patient taking differently: Take 20 mg by mouth daily.) 90 tablet 0   ARIPiprazole (ABILIFY) 10 MG tablet Take 1 tablet (10 mg total) by mouth daily. 90 tablet 0   Blood Glucose Monitoring Suppl (CONTOUR NEXT EZ) w/Device KIT Use daily to check blood sugars 1 kit 0   empagliflozin (JARDIANCE) 25 MG TABS tablet Take 1 tablet (25 mg total) by mouth daily. 90 tablet 1   ezetimibe (ZETIA) 10 MG tablet Take 1 tablet (10 mg total) by mouth daily. 30 tablet 3   glucose blood (CONTOUR NEXT TEST) test strip Use daily to check blood sugar. 100 each 12   losartan (COZAAR) 50 MG tablet Take 1 tablet (50 mg total) by mouth daily. 90 tablet 2   metFORMIN (GLUCOPHAGE-XR) 500 MG 24 hr tablet Take 2 tablets (1,000 mg total) by mouth daily with breakfast. 180 tablet 2    Microlet Lancets MISC Use daily to check blood sugar. 100 each 3   pioglitazone (ACTOS) 45 MG tablet Take 1 tablet (45 mg total) by mouth daily. 30 tablet 3   rosuvastatin (CRESTOR) 40 MG tablet Take 1 tablet (40 mg total) by mouth daily. 90 tablet 3   latanoprost (XALATAN) 0.005 % ophthalmic solution Place 1 drop into both eyes at bedtime.      lidocaine (XYLOCAINE) 2 % solution Use as directed 15 mLs in the mouth or throat every 4 (four) hours as needed for mouth pain. (Patient not taking: Reported on 01/10/2023) 100 mL 0   meloxicam (MOBIC) 15 MG tablet Take 1 tablet (15 mg total) by mouth daily as needed. (Patient not taking: Reported on 01/10/2023) 45 tablet 1   Current Facility-Administered Medications  Medication Dose Route Frequency Provider Last Rate Last Admin   0.9 %  sodium chloride infusion  500 mL Intravenous Once  Cristela Stalder V, DO        Allergies as of 01/10/2023 - Review Complete 01/10/2023  Allergen Reaction Noted   Fenofibrate Rash 02/15/2014   Penicillins Rash 07/09/2011    Family History  Problem Relation Age of Onset   Hypertension Mother    Diabetes Mother    Early death Mother    Heart disease Mother    Colon cancer Father    Heart disease Sister    Early death Brother    Heart disease Brother    Hyperlipidemia Brother    Hypertension Brother    Stomach cancer Brother    Heart disease Brother    Early death Maternal Aunt    Heart disease Maternal Aunt    Early death Maternal Uncle    Heart disease Maternal Uncle    Heart disease Maternal Grandmother    Early death Maternal Grandmother    Esophageal cancer Maternal Grandfather    Rectal cancer Neg Hx    Colon polyps Neg Hx     Social History   Socioeconomic History   Marital status: Married    Spouse name: Larita Fife   Number of children: Not on file   Years of education: Not on file   Highest education level: Not on file  Occupational History    Employer: [employer]  Tobacco Use    Smoking status: Former    Packs/day: 1.00    Years: 17.00    Additional pack years: 0.00    Total pack years: 17.00    Types: Cigarettes    Quit date: 2000    Years since quitting: 24.3    Passive exposure: Current ("daughter smokes")   Smokeless tobacco: Never   Tobacco comments:    quit in 2000 smike 1 ppd  Vaping Use   Vaping Use: Never used  Substance and Sexual Activity   Alcohol use: Yes    Alcohol/week: 7.0 standard drinks of alcohol    Types: 7 Standard drinks or equivalent per week    Comment: daily-beer   Drug use: Never    Comment: cbd oil   Sexual activity: Yes  Other Topics Concern   Not on file  Social History Narrative   Not on file   Social Determinants of Health   Financial Resource Strain: Unknown (04/03/2018)   Overall Financial Resource Strain (CARDIA)    Difficulty of Paying Living Expenses: Patient declined  Food Insecurity: Unknown (04/03/2018)   Hunger Vital Sign    Worried About Running Out of Food in the Last Year: Patient declined    Ran Out of Food in the Last Year: Patient declined  Transportation Needs: No Transportation Needs (04/03/2018)   PRAPARE - Administrator, Civil Service (Medical): No    Lack of Transportation (Non-Medical): No  Physical Activity: Inactive (04/03/2018)   Exercise Vital Sign    Days of Exercise per Week: 0 days    Minutes of Exercise per Session: 0 min  Stress: Unknown (04/03/2018)   [employer] of Occupational Health - Occupational Stress Questionnaire    Feeling of Stress : Patient declined  Social Connections: Unknown (04/03/2018)   Social Connection and Isolation Panel [NHANES]    Frequency of Communication with Friends and Family: Not on file    Frequency of Social Gatherings with Friends and Family: Not on file    Attends Religious Services: Not on file    Active Member of Clubs or Organizations: Not on file    Attends Club  or Organization Meetings: Not on file    Marital Status: Married   Intimate Partner Violence: Unknown (04/03/2018)   Humiliation, Afraid, Rape, and Kick questionnaire    Fear of Current or Ex-Partner: Patient declined    Emotionally Abused: Patient declined    Physically Abused: Patient declined    Sexually Abused: Patient declined    Physical Exam: Vital signs in last 24 hours: @BP  (!) 141/68   Pulse 80   Temp 98.2 F (36.8 C)   Resp 14   Ht 5\' 8"  (1.727 m)   Wt 180 lb (81.6 kg)   SpO2 98%   BMI 27.37 kg/m  GEN: NAD EYE: Sclerae anicteric ENT: MMM CV: Non-tachycardic Pulm: CTA b/l GI: Soft, NT/ND NEURO:  Alert & Oriented x 3   Doristine Locks, DO [facility]   01/10/2023 2:49 PM

## 2023-01-11 ENCOUNTER — Telehealth: Payer: Self-pay

## 2023-01-11 ENCOUNTER — Other Ambulatory Visit (HOSPITAL_BASED_OUTPATIENT_CLINIC_OR_DEPARTMENT_OTHER): Payer: Self-pay

## 2023-01-11 NOTE — Telephone Encounter (Signed)
  Follow up Call-     01/10/2023    2:21 PM 10/24/2021    8:25 AM  Call back number  Post procedure Call Back phone  # [phone]  Permission to leave phone message Yes Yes     Patient questions:  Do you have a fever, pain , or abdominal swelling? No. Pain Score  0 *  Have you tolerated food without any problems? Yes.    Have you been able to return to your normal activities? Yes.    Do you have any questions about your discharge instructions: Diet   No. Medications  No. Follow up visit  No.  Do you have questions or concerns about your Care? No.  Actions: * If pain score is 4 or above: No action needed, pain <4.

## 2023-01-13 ENCOUNTER — Other Ambulatory Visit: Payer: Self-pay | Admitting: Family Medicine

## 2023-01-14 ENCOUNTER — Other Ambulatory Visit (HOSPITAL_BASED_OUTPATIENT_CLINIC_OR_DEPARTMENT_OTHER): Payer: Self-pay

## 2023-01-14 MED ORDER — METFORMIN HCL ER 500 MG PO TB24
1000.0000 mg | ORAL_TABLET | Freq: Every day | ORAL | 2 refills | Status: DC
  Filled 2023-01-14: qty 180, 90d supply, fill #0
  Filled 2023-04-09: qty 180, 90d supply, fill #1
  Filled 2023-07-08: qty 180, 90d supply, fill #2

## 2023-01-16 ENCOUNTER — Encounter: Payer: Self-pay | Admitting: Gastroenterology

## 2023-01-22 ENCOUNTER — Ambulatory Visit (INDEPENDENT_AMBULATORY_CARE_PROVIDER_SITE_OTHER): Payer: BC Managed Care – PPO | Admitting: Family Medicine

## 2023-01-22 ENCOUNTER — Encounter: Payer: Self-pay | Admitting: Family Medicine

## 2023-01-22 VITALS — BP 118/70 | HR 76 | Temp 97.6°F | Ht 68.0 in | Wt 177.4 lb

## 2023-01-22 DIAGNOSIS — Z125 Encounter for screening for malignant neoplasm of prostate: Secondary | ICD-10-CM | POA: Diagnosis not present

## 2023-01-22 DIAGNOSIS — E1165 Type 2 diabetes mellitus with hyperglycemia: Secondary | ICD-10-CM | POA: Diagnosis not present

## 2023-01-22 DIAGNOSIS — Z0000 Encounter for general adult medical examination without abnormal findings: Secondary | ICD-10-CM

## 2023-01-22 LAB — CBC
HCT: 45.1 % (ref 39.0–52.0)
Hemoglobin: 14.9 g/dL (ref 13.0–17.0)
MCHC: 33 g/dL (ref 30.0–36.0)
MCV: 98 fl (ref 78.0–100.0)
Platelets: 223 10*3/uL (ref 150.0–400.0)
RBC: 4.6 Mil/uL (ref 4.22–5.81)
RDW: 13.8 % (ref 11.5–15.5)
WBC: 7.2 10*3/uL (ref 4.0–10.5)

## 2023-01-22 LAB — COMPREHENSIVE METABOLIC PANEL
ALT: 55 U/L — ABNORMAL HIGH (ref 0–53)
AST: 35 U/L (ref 0–37)
Albumin: 4.2 g/dL (ref 3.5–5.2)
Alkaline Phosphatase: 64 U/L (ref 39–117)
BUN: 22 mg/dL (ref 6–23)
CO2: 25 mEq/L (ref 19–32)
Calcium: 9.1 mg/dL (ref 8.4–10.5)
Chloride: 103 mEq/L (ref 96–112)
Creatinine, Ser: 0.94 mg/dL (ref 0.40–1.50)
GFR: 89.04 mL/min (ref >60.00)
Glucose, Bld: 155 mg/dL — ABNORMAL HIGH (ref 70–99)
Potassium: 4.8 mEq/L (ref 3.5–5.1)
Sodium: 138 mEq/L (ref 135–145)
Total Bilirubin: 0.7 mg/dL (ref 0.2–1.2)
Total Protein: 6.4 g/dL (ref 6.0–8.3)

## 2023-01-22 LAB — HEMOGLOBIN A1C: Hgb A1c MFr Bld: 7.3 % — ABNORMAL HIGH (ref 4.6–6.5)

## 2023-01-22 LAB — LIPID PANEL
Cholesterol: 71 mg/dL (ref 0–200)
HDL: 41.1 mg/dL (ref >39.00)
LDL Cholesterol: 4 mg/dL (ref 0–99)
NonHDL: 29.61
Total CHOL/HDL Ratio: 2
Triglycerides: 129 mg/dL (ref 0.0–149.0)
VLDL: 25.8 mg/dL (ref 0.0–40.0)

## 2023-01-22 LAB — PSA: PSA: 0.71 ng/mL (ref 0.10–4.00)

## 2023-01-22 NOTE — Patient Instructions (Addendum)
Give [place] 2-3 business days to get the results of your labs back. Follow up will be determine by your results.   Keep the diet clean and stay active.  Please get me a copy of your advanced directive form at your convenience.   Please schedule your eye exam.   Please check on if you got the shingles vaccine (2 shot series) and let me know if you did.   Let us know if you need anything.

## 2023-01-22 NOTE — Progress Notes (Signed)
Chief Complaint  Patient presents with   Annual Exam    Well Male NOHEA MATHIESEN is here for a complete physical.   His last physical was >1 year ago.  Current diet: in general, a "healthy" diet.  Current exercise: none Weight trend: stable Fatigue out of ordinary? No. Seat belt? Yes.   Advanced directive? Unsure   Health maintenance Shingrix- No Colonoscopy- Yes Tetanus- Yes HIV- Yes Hep C- Yes   Past Medical History:  Diagnosis Date   Anxiety    Diabetes mellitus without complication (HCC)    type 2   Family history of adverse reaction to anesthesia    Grandson has trouble waking up both times under anesthesia diagnosed with sleep apnea   GERD (gastroesophageal reflux disease)    "had 4-5 years ago"   Heart murmur    since birth   Hyperlipidemia 2011   Hypertension    Sleep apnea    wears cpap   Temporomandibular jaw dysfunction       Past Surgical History:  Procedure Laterality Date   COLONOSCOPY  1983   COLONOSCOPY WITH PROPOFOL  10/24/2021   INGUINAL HERNIA REPAIR N/A 04/09/2018   Procedure: LAPAROSCOPIC RIGHT INGUINAL AND BILATERAL FEMORAL HERNIA REPAIR;  Surgeon: Karie Soda, MD;  Location: [facility];  Service: General;  Laterality: N/A;   INSERTION OF MESH N/A 04/09/2018   Procedure: INSERTION OF MESH;  Surgeon: Karie Soda, MD;  Location: [facility];  Service: General;  Laterality: N/A;   LAPAROSCOPIC RIGHT HEMI COLECTOMY Right 12/20/2021   Procedure: LAPAROSCOPIC RIGHT HEMI COLECTOMY;  Surgeon: Andria Meuse, MD;  Location: [facility];  Service: General;  Laterality: Right;   NO PAST SURGERIES  07/09/2011   Denies surgical history   WISDOM TOOTH EXTRACTION      Medications  Current Outpatient Medications on File Prior to Visit  Medication Sig Dispense Refill   ARIPiprazole (ABILIFY) 10 MG tablet Take 1 tablet (10 mg total) by mouth daily. (Patient taking differently: Take 20 mg by mouth daily.) 90 tablet 0   ARIPiprazole (ABILIFY) 10 MG tablet  Take 1 tablet (10 mg total) by mouth daily. 90 tablet 0   Blood Glucose Monitoring Suppl (CONTOUR NEXT EZ) w/Device KIT Use daily to check blood sugars 1 kit 0   empagliflozin (JARDIANCE) 25 MG TABS tablet Take 1 tablet (25 mg total) by mouth daily. 90 tablet 1   ezetimibe (ZETIA) 10 MG tablet Take 1 tablet (10 mg total) by mouth daily. 30 tablet 3   glucose blood (CONTOUR NEXT TEST) test strip Use daily to check blood sugar. 100 each 12   latanoprost (XALATAN) 0.005 % ophthalmic solution Place 1 drop into both eyes at bedtime.      lidocaine (XYLOCAINE) 2 % solution Use as directed 15 mLs in the mouth or throat every 4 (four) hours as needed for mouth pain. (Patient not taking: Reported on 01/10/2023) 100 mL 0   losartan (COZAAR) 50 MG tablet Take 1 tablet (50 mg total) by mouth daily. 90 tablet 2   meloxicam (MOBIC) 15 MG tablet Take 1 tablet (15 mg total) by mouth daily as needed. (Patient not taking: Reported on 01/10/2023) 45 tablet 1   metFORMIN (GLUCOPHAGE-XR) 500 MG 24 hr tablet Take 2 tablets (1,000 mg total) by mouth daily with breakfast. 180 tablet 2   Microlet Lancets MISC Use daily to check blood sugar. 100 each 3   pioglitazone (ACTOS) 45 MG tablet Take 1 tablet (45 mg total) by mouth  daily. 30 tablet 3   rosuvastatin (CRESTOR) 40 MG tablet Take 1 tablet (40 mg total) by mouth daily. 90 tablet 3    Allergies Allergies  Allergen Reactions   Fenofibrate Rash   Penicillins Rash    Childhood allergy Has patient had a PCN reaction causing immediate rash, facial/tongue/throat swelling, SOB or lightheadedness with hypotension: Yes Has patient had a PCN reaction causing severe rash involving mucus membranes or skin necrosis: No Has patient had a PCN reaction that required hospitalization: No Has patient had a PCN reaction occurring within the last 10 years: No If all of the above answers are "NO", then may proceed with Cephalosporin use.     Family History Family History  Problem  Relation Age of Onset   Hypertension Mother    Diabetes Mother    Early death Mother    Heart disease Mother    Colon cancer Father    Heart disease Sister    Early death Brother    Heart disease Brother    Hyperlipidemia Brother    Hypertension Brother    Stomach cancer Brother    Heart disease Brother    Early death Maternal Aunt    Heart disease Maternal Aunt    Early death Maternal Uncle    Heart disease Maternal Uncle    Heart disease Maternal Grandmother    Early death Maternal Grandmother    Esophageal cancer Maternal Grandfather    Rectal cancer Neg Hx    Colon polyps Neg Hx     Review of Systems: Constitutional:  no fevers Eye:  no recent significant change in vision Ear/Nose/Mouth/Throat:  Ears:  no hearing loss Nose/Mouth/Throat:  no complaints of nasal congestion, no sore throat Cardiovascular:  no chest pain Respiratory:  no shortness of breath Gastrointestinal:  no change in bowel habits GU:  Male: negative for dysuria, frequency Musculoskeletal/Extremities:  no joint pain Integumentary (Skin/Breast):  no abnormal skin lesions reported Neurologic:  no headaches Endocrine: No unexpected weight changes Hematologic/Lymphatic:  no abnormal bleeding  Exam BP 118/70 (BP Location: Left Arm, Patient Position: Sitting, Cuff Size: Normal)   Pulse 76   Temp 97.6 F (36.4 C) (Oral)   Ht 5\' 8"  (1.727 m)   Wt 177 lb 6 oz (80.5 kg)   SpO2 94%   BMI 26.97 kg/m  General:  well developed, well nourished, in no apparent distress Skin:  no significant moles, warts, or growths Head:  no masses, lesions, or tenderness Eyes:  pupils equal and round, sclera anicteric without injection Ears:  canals without lesions, TMs shiny without retraction, no obvious effusion, no erythema Nose:  nares patent, mucosa normal Throat/Pharynx:  lips and gingiva without lesion; tongue and uvula midline; non-inflamed pharynx; no exudates or postnasal drainage Neck: neck supple without  adenopathy, thyromegaly, or masses Cardiac: RRR, no bruits, no LE edema Lungs:  clear to auscultation, breath sounds equal bilaterally, no respiratory distress Abdomen: BS+, soft, non-tender, non-distended, no masses or organomegaly noted Rectal: Deferred Musculoskeletal:  symmetrical muscle groups noted without atrophy or deformity Neuro:  gait normal; deep tendon reflexes normal and symmetric Psych: well oriented with normal range of affect and appropriate judgment/insight  Assessment and Plan  Well adult exam - Plan: CBC, Comprehensive metabolic panel, Lipid panel  Type 2 diabetes mellitus with hyperglycemia, without long-term current use of insulin (HCC) - Plan: Hemoglobin A1c  Screening for prostate cancer - Plan: PSA   Well 57 y.o. male. Counseled on diet and exercise. Counseled on risks and  benefits of prostate cancer screening with PSA. The patient agrees to undergo testing. Needs to sched eye exam. Will ck his records about the Shingrix.  Advanced directive form provided today. He will see if he already has this.  Immunizations, labs, and further orders as above. Follow up in 3-6 mo pending above. The patient voiced understanding and agreement to the plan.  Jilda Roche [place], DO 01/22/23 8:13 AM

## 2023-01-29 ENCOUNTER — Other Ambulatory Visit (HOSPITAL_BASED_OUTPATIENT_CLINIC_OR_DEPARTMENT_OTHER): Payer: Self-pay

## 2023-02-14 ENCOUNTER — Other Ambulatory Visit: Payer: Self-pay | Admitting: Family Medicine

## 2023-02-14 ENCOUNTER — Other Ambulatory Visit: Payer: Self-pay

## 2023-02-14 ENCOUNTER — Other Ambulatory Visit (HOSPITAL_BASED_OUTPATIENT_CLINIC_OR_DEPARTMENT_OTHER): Payer: Self-pay

## 2023-02-14 MED ORDER — EZETIMIBE 10 MG PO TABS
10.0000 mg | ORAL_TABLET | Freq: Every day | ORAL | 0 refills | Status: DC
  Filled 2023-02-14: qty 90, 90d supply, fill #0

## 2023-02-18 ENCOUNTER — Other Ambulatory Visit (HOSPITAL_BASED_OUTPATIENT_CLINIC_OR_DEPARTMENT_OTHER): Payer: Self-pay

## 2023-03-21 ENCOUNTER — Other Ambulatory Visit: Payer: Self-pay | Admitting: Family Medicine

## 2023-03-21 ENCOUNTER — Other Ambulatory Visit (HOSPITAL_BASED_OUTPATIENT_CLINIC_OR_DEPARTMENT_OTHER): Payer: Self-pay

## 2023-03-21 DIAGNOSIS — F330 Major depressive disorder, recurrent, mild: Secondary | ICD-10-CM | POA: Diagnosis not present

## 2023-03-21 DIAGNOSIS — F411 Generalized anxiety disorder: Secondary | ICD-10-CM | POA: Diagnosis not present

## 2023-03-21 MED ORDER — ARIPIPRAZOLE 10 MG PO TABS
10.0000 mg | ORAL_TABLET | Freq: Every day | ORAL | 0 refills | Status: DC
  Filled 2023-03-21: qty 90, 90d supply, fill #0

## 2023-03-21 MED ORDER — PIOGLITAZONE HCL 45 MG PO TABS
45.0000 mg | ORAL_TABLET | Freq: Every day | ORAL | 3 refills | Status: DC
  Filled 2023-03-21: qty 30, 30d supply, fill #0
  Filled 2023-04-17: qty 30, 30d supply, fill #1
  Filled 2023-05-17: qty 30, 30d supply, fill #2
  Filled 2023-06-24: qty 30, 30d supply, fill #3

## 2023-04-09 ENCOUNTER — Other Ambulatory Visit: Payer: Self-pay | Admitting: Family Medicine

## 2023-04-10 ENCOUNTER — Other Ambulatory Visit (HOSPITAL_BASED_OUTPATIENT_CLINIC_OR_DEPARTMENT_OTHER): Payer: Self-pay

## 2023-04-10 MED ORDER — EMPAGLIFLOZIN 25 MG PO TABS
25.0000 mg | ORAL_TABLET | Freq: Every day | ORAL | 1 refills | Status: DC
  Filled 2023-04-10: qty 90, 90d supply, fill #0
  Filled 2023-07-22: qty 90, 90d supply, fill #1

## 2023-04-11 ENCOUNTER — Encounter (INDEPENDENT_AMBULATORY_CARE_PROVIDER_SITE_OTHER): Payer: Self-pay

## 2023-04-17 ENCOUNTER — Other Ambulatory Visit (HOSPITAL_BASED_OUTPATIENT_CLINIC_OR_DEPARTMENT_OTHER): Payer: Self-pay

## 2023-04-17 ENCOUNTER — Other Ambulatory Visit: Payer: Self-pay

## 2023-04-17 ENCOUNTER — Other Ambulatory Visit: Payer: Self-pay | Admitting: Family Medicine

## 2023-04-17 MED ORDER — EZETIMIBE 10 MG PO TABS
10.0000 mg | ORAL_TABLET | Freq: Every day | ORAL | 0 refills | Status: DC
  Filled 2023-04-17 – 2023-05-17 (×2): qty 90, 90d supply, fill #0

## 2023-05-08 ENCOUNTER — Ambulatory Visit: Payer: BC Managed Care – PPO | Admitting: Family Medicine

## 2023-05-08 ENCOUNTER — Other Ambulatory Visit (HOSPITAL_BASED_OUTPATIENT_CLINIC_OR_DEPARTMENT_OTHER): Payer: Self-pay

## 2023-05-08 ENCOUNTER — Other Ambulatory Visit: Payer: Self-pay | Admitting: Family Medicine

## 2023-05-08 ENCOUNTER — Encounter: Payer: Self-pay | Admitting: Family Medicine

## 2023-05-08 VITALS — BP 108/72 | HR 63 | Temp 97.9°F | Ht 68.0 in | Wt 185.1 lb

## 2023-05-08 DIAGNOSIS — I10 Essential (primary) hypertension: Secondary | ICD-10-CM

## 2023-05-08 DIAGNOSIS — E1165 Type 2 diabetes mellitus with hyperglycemia: Secondary | ICD-10-CM

## 2023-05-08 LAB — HEMOGLOBIN A1C: Hgb A1c MFr Bld: 8.2 % — ABNORMAL HIGH (ref 4.6–6.5)

## 2023-05-08 LAB — HEPATIC FUNCTION PANEL
ALT: 61 U/L — ABNORMAL HIGH (ref 0–53)
AST: 38 U/L — ABNORMAL HIGH (ref 0–37)
Albumin: 4.1 g/dL (ref 3.5–5.2)
Alkaline Phosphatase: 65 U/L (ref 39–117)
Bilirubin, Direct: 0.3 mg/dL (ref 0.0–0.3)
Total Bilirubin: 1.2 mg/dL (ref 0.2–1.2)
Total Protein: 6.3 g/dL (ref 6.0–8.3)

## 2023-05-08 MED ORDER — RYBELSUS 3 MG PO TABS
3.0000 mg | ORAL_TABLET | Freq: Every day | ORAL | 0 refills | Status: DC
  Filled 2023-05-08: qty 30, 30d supply, fill #0

## 2023-05-08 MED ORDER — RYBELSUS 7 MG PO TABS
7.0000 mg | ORAL_TABLET | Freq: Every day | ORAL | 2 refills | Status: DC
  Filled 2023-05-08: qty 30, 30d supply, fill #0

## 2023-05-08 NOTE — Patient Instructions (Addendum)
Give [place] 2-3 business days to get the results of your labs back.   Keep the diet clean and stay active.  No changes with medicine until we get your results.  Let's stop the losartan (for blood pressure). Monitor your blood pressure at home. Send me a message if it starts creeping up again.   I want your blood pressure less than 130 on the top and less than 90 on the bottom consistently. Both goals must be met (ie, 150/70 is too high even though the 70 on the bottom is desirable).   Let us know if you need anything.

## 2023-05-08 NOTE — Progress Notes (Signed)
Subjective:   Chief Complaint  Patient presents with   Follow-up    3 month DM    Shawn Perez is a 57 y.o. male here for follow-up of diabetes.   Shawn Perez's self monitored glucose range is 120's.  Patient denies hypoglycemic reactions. He checks his glucose levels 1 time(s) per week. Patient does not require insulin.   Medications include: Metformin XR 1000 mg/d, Actos 45 mg/d, Jardiance 25 mg/d Diet is fair.  Exercise: none No CP or SOB.   Hypertension Patient presents for hypertension follow up. He does monitor home blood pressures. Blood pressures ranging on average from 100's/70's. He is compliant with medication- losartan 50 mg/d. Patient has these side effects of medication: none Diet/exercise as above.   Past Medical History:  Diagnosis Date   Anxiety    Diabetes mellitus without complication (HCC)    type 2   Family history of adverse reaction to anesthesia    Shawn Perez has trouble waking up both times under anesthesia diagnosed with sleep apnea   GERD (gastroesophageal reflux disease)    "had 4-5 years ago"   Heart murmur    since birth   Hyperlipidemia 2011   Hypertension    Sleep apnea    wears cpap   Temporomandibular jaw dysfunction      Related testing: Retinal exam: Due- scheduled 4 mo ago Pneumovax: done  Objective:  BP 108/72 (BP Location: Left Arm, Patient Position: Sitting, Cuff Size: Normal)   Pulse 63   Temp 97.9 F (36.6 C) (Oral)   Ht 5\' 8"  (1.727 m)   Wt 185 lb 2 oz (84 kg)   SpO2 97%   BMI 28.15 kg/m  General:  Well developed, well nourished, in no apparent distress Skin:  Warm, no pallor or diaphoresis Head:  Normocephalic, atraumatic Eyes:  Pupils equal and round, sclera anicteric without injection  Lungs:  CTAB, no access msc use Cardio:  RRR, no bruits, no LE edema Musculoskeletal:  Symmetrical muscle groups noted without atrophy or deformity Neuro:  Sensation intact to pinprick on feet Psych: Age appropriate judgment and  insight  Assessment:   Type 2 diabetes mellitus with hyperglycemia, without long-term current use of insulin (HCC) - Plan: Hemoglobin A1c, Hepatic function panel  Essential hypertension   Plan:   Chronic, unsure if stable. Cont metformin XR 1000 mg/d, Actos 45 mg/d, Jardiance 25 mg/d. May need to add Rybelsus.Counseled on diet and exercise. Chronic, stable. No AE's, but has been having low readings. Interested in coming off of the losartan. Will see how he does. He will monitor BP at home and let me know if things worsen.  F/u in 3-6 mo. The patient voiced understanding and agreement to the plan.  Shawn Roche [place], DO 05/08/23 8:18 AM

## 2023-05-17 ENCOUNTER — Other Ambulatory Visit (HOSPITAL_BASED_OUTPATIENT_CLINIC_OR_DEPARTMENT_OTHER): Payer: Self-pay

## 2023-06-04 ENCOUNTER — Other Ambulatory Visit: Payer: Self-pay | Admitting: Family Medicine

## 2023-06-04 ENCOUNTER — Other Ambulatory Visit (HOSPITAL_BASED_OUTPATIENT_CLINIC_OR_DEPARTMENT_OTHER): Payer: Self-pay

## 2023-06-04 MED ORDER — RYBELSUS 3 MG PO TABS
3.0000 mg | ORAL_TABLET | Freq: Every day | ORAL | 0 refills | Status: AC
  Filled 2023-06-04 – 2023-07-04 (×2): qty 30, 30d supply, fill #0

## 2023-06-05 ENCOUNTER — Other Ambulatory Visit (HOSPITAL_COMMUNITY): Payer: Self-pay

## 2023-06-05 ENCOUNTER — Telehealth: Payer: Self-pay

## 2023-06-05 ENCOUNTER — Other Ambulatory Visit (HOSPITAL_BASED_OUTPATIENT_CLINIC_OR_DEPARTMENT_OTHER): Payer: Self-pay

## 2023-06-05 NOTE — Telephone Encounter (Signed)
Pharmacy Patient Advocate Encounter   Received notification from Physician's Office that prior authorization for Rybelsus 7mg  is required/requested.   Insurance verification completed.   The patient is insured through [facility] .   Per test claim: PA required; PA submitted to [employer] via CoverMyMeds Key/confirmation #/EOC Key: [id number]  Status is pending

## 2023-06-06 ENCOUNTER — Other Ambulatory Visit (HOSPITAL_BASED_OUTPATIENT_CLINIC_OR_DEPARTMENT_OTHER): Payer: Self-pay

## 2023-06-06 NOTE — Telephone Encounter (Signed)
Fyi pt came in the office to check on the status of pa.

## 2023-06-07 ENCOUNTER — Other Ambulatory Visit (HOSPITAL_BASED_OUTPATIENT_CLINIC_OR_DEPARTMENT_OTHER): Payer: Self-pay

## 2023-06-07 NOTE — Telephone Encounter (Signed)
Looked into PA, clinical questions were answered and submitted with chart notes and lab results. Please be advised that insurances can take 5 to 7 business days to make decision. PA has been submitted as urgent, hopefully this will help turn around time. Thanks

## 2023-06-07 NOTE — Telephone Encounter (Signed)
Could you take a look at the PA for this patient

## 2023-06-07 NOTE — Telephone Encounter (Signed)
Patient informed of time frame

## 2023-06-12 ENCOUNTER — Other Ambulatory Visit (HOSPITAL_BASED_OUTPATIENT_CLINIC_OR_DEPARTMENT_OTHER): Payer: Self-pay

## 2023-06-12 ENCOUNTER — Other Ambulatory Visit (HOSPITAL_COMMUNITY): Payer: Self-pay

## 2023-06-12 MED ORDER — RYBELSUS 7 MG PO TABS
7.0000 mg | ORAL_TABLET | Freq: Every day | ORAL | 2 refills | Status: DC
  Filled 2023-06-12: qty 30, 30d supply, fill #0
  Filled 2023-07-04 – 2023-07-05 (×3): qty 30, 30d supply, fill #1
  Filled 2023-08-07: qty 30, 30d supply, fill #2

## 2023-06-12 NOTE — Telephone Encounter (Signed)
Pharmacy Patient Advocate Encounter  Received notification from [facility] that Prior Authorization for Rybelsus 7mg  has been APPROVED from 06/07/23 to 06/06/24. Ran test claim, Copay is $14.99. This test claim was processed through [facility]- copay amounts may vary at other pharmacies due to pharmacy/plan contracts, or as the patient moves through the different stages of their insurance plan. I'll let the pharmacy know to fill hs prescription.  PA #/Case ID/Reference #: [id number]

## 2023-06-12 NOTE — Telephone Encounter (Signed)
Called the patient to inform of response to PA [facility]

## 2023-06-12 NOTE — Addendum Note (Signed)
Addended by: Scharlene Gloss B on: 06/12/2023 08:30 AM   Modules accepted: Orders

## 2023-06-14 ENCOUNTER — Other Ambulatory Visit (HOSPITAL_BASED_OUTPATIENT_CLINIC_OR_DEPARTMENT_OTHER): Payer: Self-pay

## 2023-06-14 DIAGNOSIS — F411 Generalized anxiety disorder: Secondary | ICD-10-CM | POA: Diagnosis not present

## 2023-06-14 DIAGNOSIS — F330 Major depressive disorder, recurrent, mild: Secondary | ICD-10-CM | POA: Diagnosis not present

## 2023-06-14 MED ORDER — ARIPIPRAZOLE 10 MG PO TABS
10.0000 mg | ORAL_TABLET | Freq: Every day | ORAL | 0 refills | Status: DC
  Filled 2023-06-14: qty 90, 90d supply, fill #0

## 2023-07-04 ENCOUNTER — Other Ambulatory Visit (HOSPITAL_BASED_OUTPATIENT_CLINIC_OR_DEPARTMENT_OTHER): Payer: Self-pay

## 2023-07-04 ENCOUNTER — Other Ambulatory Visit: Payer: Self-pay

## 2023-07-05 ENCOUNTER — Other Ambulatory Visit: Payer: Self-pay

## 2023-07-05 ENCOUNTER — Other Ambulatory Visit (HOSPITAL_BASED_OUTPATIENT_CLINIC_OR_DEPARTMENT_OTHER): Payer: Self-pay

## 2023-07-22 ENCOUNTER — Other Ambulatory Visit: Payer: Self-pay | Admitting: Family Medicine

## 2023-07-22 ENCOUNTER — Other Ambulatory Visit (HOSPITAL_BASED_OUTPATIENT_CLINIC_OR_DEPARTMENT_OTHER): Payer: Self-pay

## 2023-07-22 MED ORDER — ROSUVASTATIN CALCIUM 40 MG PO TABS
40.0000 mg | ORAL_TABLET | Freq: Every day | ORAL | 3 refills | Status: DC
  Filled 2023-07-22: qty 90, 90d supply, fill #0
  Filled 2023-10-21: qty 90, 90d supply, fill #1
  Filled 2024-01-14: qty 90, 90d supply, fill #2
  Filled 2024-04-20: qty 90, 90d supply, fill #3

## 2023-07-22 MED ORDER — PIOGLITAZONE HCL 45 MG PO TABS
45.0000 mg | ORAL_TABLET | Freq: Every day | ORAL | 3 refills | Status: DC
  Filled 2023-07-22: qty 30, 30d supply, fill #0
  Filled 2023-08-18: qty 30, 30d supply, fill #1
  Filled 2023-09-27 (×3): qty 30, 30d supply, fill #2
  Filled 2023-10-28: qty 30, 30d supply, fill #3

## 2023-07-23 ENCOUNTER — Other Ambulatory Visit (HOSPITAL_BASED_OUTPATIENT_CLINIC_OR_DEPARTMENT_OTHER): Payer: Self-pay

## 2023-07-31 ENCOUNTER — Other Ambulatory Visit (HOSPITAL_BASED_OUTPATIENT_CLINIC_OR_DEPARTMENT_OTHER): Payer: Self-pay

## 2023-07-31 MED ORDER — ARIPIPRAZOLE 10 MG PO TABS
10.0000 mg | ORAL_TABLET | Freq: Every day | ORAL | 0 refills | Status: DC
  Filled 2023-10-21: qty 90, 90d supply, fill #0

## 2023-08-07 ENCOUNTER — Other Ambulatory Visit (HOSPITAL_BASED_OUTPATIENT_CLINIC_OR_DEPARTMENT_OTHER): Payer: Self-pay

## 2023-08-07 ENCOUNTER — Other Ambulatory Visit: Payer: Self-pay | Admitting: Family Medicine

## 2023-08-07 ENCOUNTER — Ambulatory Visit: Payer: BC Managed Care – PPO | Admitting: Family Medicine

## 2023-08-07 ENCOUNTER — Encounter: Payer: Self-pay | Admitting: Family Medicine

## 2023-08-07 VITALS — BP 110/64 | HR 68 | Temp 97.9°F | Resp 16 | Ht 68.0 in | Wt 183.2 lb

## 2023-08-07 DIAGNOSIS — E785 Hyperlipidemia, unspecified: Secondary | ICD-10-CM

## 2023-08-07 DIAGNOSIS — E1165 Type 2 diabetes mellitus with hyperglycemia: Secondary | ICD-10-CM | POA: Diagnosis not present

## 2023-08-07 LAB — LIPID PANEL
Cholesterol: 60 mg/dL (ref 0–200)
HDL: 28.5 mg/dL — ABNORMAL LOW (ref >39.00)
LDL Cholesterol: 1 mg/dL (ref 0–99)
NonHDL: 31.19
Total CHOL/HDL Ratio: 2
Triglycerides: 149 mg/dL (ref 0.0–149.0)
VLDL: 29.8 mg/dL (ref 0.0–40.0)

## 2023-08-07 LAB — HEMOGLOBIN A1C: Hgb A1c MFr Bld: 8.1 % — ABNORMAL HIGH (ref 4.6–6.5)

## 2023-08-07 LAB — COMPREHENSIVE METABOLIC PANEL
ALT: 50 U/L (ref 0–53)
AST: 31 U/L (ref 0–37)
Albumin: 4.5 g/dL (ref 3.5–5.2)
Alkaline Phosphatase: 62 U/L (ref 39–117)
BUN: 23 mg/dL (ref 6–23)
CO2: 31 meq/L (ref 19–32)
Calcium: 9.6 mg/dL (ref 8.4–10.5)
Chloride: 100 meq/L (ref 96–112)
Creatinine, Ser: 0.92 mg/dL (ref 0.40–1.50)
GFR: 91.02 mL/min (ref >60.00)
Glucose, Bld: 125 mg/dL — ABNORMAL HIGH (ref 70–99)
Potassium: 4.6 meq/L (ref 3.5–5.1)
Sodium: 140 meq/L (ref 135–145)
Total Bilirubin: 1.3 mg/dL — ABNORMAL HIGH (ref 0.2–1.2)
Total Protein: 6.7 g/dL (ref 6.0–8.3)

## 2023-08-07 LAB — MICROALBUMIN / CREATININE URINE RATIO
Creatinine,U: 72 mg/dL
Microalb Creat Ratio: 1.5 mg/g (ref 0.0–30.0)
Microalb, Ur: 1.1 mg/dL (ref 0.0–1.9)

## 2023-08-07 MED ORDER — RYBELSUS 14 MG PO TABS
14.0000 mg | ORAL_TABLET | Freq: Every day | ORAL | 2 refills | Status: DC
  Filled 2023-08-07: qty 30, 30d supply, fill #0
  Filled 2023-09-01: qty 30, 30d supply, fill #1
  Filled 2023-09-27 – 2023-09-30 (×2): qty 30, 30d supply, fill #2

## 2023-08-07 NOTE — Patient Instructions (Signed)
Give [place] 2-3 business days to get the results of your labs back. Our follow up will be based on the results.   Keep the diet clean and stay active.  Let us know if you need anything.

## 2023-08-07 NOTE — Progress Notes (Signed)
Subjective:   Chief Complaint  Patient presents with   Follow-up    Follow up    Shawn Perez is a 57 y.o. male here for follow-up of diabetes.   Graiden's self monitored glucose range is low-mid 100's.  Patient denies hypoglycemic reactions. He checks his glucose levels 1 time(s) per week. Patient does not require insulin.   Medications include: Metformin XR 1000 mg/d, Actos 45 mg/d, Jardiance 25 mg/d, Rybelsus 7 mg/d Diet is healthy.  Exercise: none  Hyperlipidemia Patient presents for dyslipidemia follow up. Currently being treated with Crestor 40 mg/d, Zetia 10 mg/d and compliance with treatment thus far has been good. He denies myalgias. Diet/exercise as above.  The patient is not known to have coexisting coronary artery disease.  Past Medical History:  Diagnosis Date   Anxiety    Diabetes mellitus without complication (HCC)    type 2   Family history of adverse reaction to anesthesia    [name] has trouble waking up both times under anesthesia diagnosed with sleep apnea   GERD (gastroesophageal reflux disease)    "had 4-5 years ago"   Heart murmur    since birth   Hyperlipidemia 2011   Hypertension    Sleep apnea    wears cpap   Temporomandibular jaw dysfunction      Related testing: Retinal exam: Due Pneumovax: done  Objective:  BP 110/64 (BP Location: Left Arm, Patient Position: Sitting, Cuff Size: Normal)   Pulse 68   Temp 97.9 F (36.6 C) (Oral)   Resp 16   Ht 5\' 8"  (1.727 m)   Wt 183 lb 3.2 oz (83.1 kg)   SpO2 96%   BMI 27.86 kg/m  General:  Well developed, well nourished, in no apparent distress Lungs:  CTAB, no access msc use Cardio:  RRR, no bruits, no LE edema Psych: Age appropriate judgment and insight  Assessment:   Type 2 diabetes mellitus with hyperglycemia, without long-term current use of insulin (HCC) - Plan: Hemoglobin A1c, Microalbumin / creatinine urine ratio  Hyperlipidemia LDL goal <70 - Plan: Comprehensive metabolic  panel, Lipid panel   Plan:   Chronic, uncontrolled. Cont Metformin XR 1000 mg/d, Actos 45 mg/d, Jardiance 25 mg/d, Rybelsus 7 mg/d. Counseled on diet and exercise. Chronic, hopefully stable.  Continue Zetia 10 mg daily, Crestor 40 mg daily. F/u in 3-6 mo pending the above results. The patient voiced understanding and agreement to the plan.  Jilda Roche [place], DO 08/07/23 8:50 AM

## 2023-08-09 ENCOUNTER — Ambulatory Visit: Payer: BC Managed Care – PPO | Admitting: Family Medicine

## 2023-08-13 ENCOUNTER — Ambulatory Visit: Payer: BC Managed Care – PPO | Admitting: Family Medicine

## 2023-08-18 ENCOUNTER — Other Ambulatory Visit: Payer: Self-pay | Admitting: Family Medicine

## 2023-08-19 ENCOUNTER — Other Ambulatory Visit (HOSPITAL_BASED_OUTPATIENT_CLINIC_OR_DEPARTMENT_OTHER): Payer: Self-pay

## 2023-08-19 MED ORDER — EZETIMIBE 10 MG PO TABS
10.0000 mg | ORAL_TABLET | Freq: Every day | ORAL | 0 refills | Status: DC
  Filled 2023-08-19: qty 90, 90d supply, fill #0

## 2023-09-02 DIAGNOSIS — H2513 Age-related nuclear cataract, bilateral: Secondary | ICD-10-CM | POA: Diagnosis not present

## 2023-09-02 DIAGNOSIS — H348322 Tributary (branch) retinal vein occlusion, left eye, stable: Secondary | ICD-10-CM | POA: Diagnosis not present

## 2023-09-02 DIAGNOSIS — H401131 Primary open-angle glaucoma, bilateral, mild stage: Secondary | ICD-10-CM | POA: Diagnosis not present

## 2023-09-02 DIAGNOSIS — E119 Type 2 diabetes mellitus without complications: Secondary | ICD-10-CM | POA: Diagnosis not present

## 2023-09-13 DIAGNOSIS — F411 Generalized anxiety disorder: Secondary | ICD-10-CM | POA: Diagnosis not present

## 2023-09-13 DIAGNOSIS — F330 Major depressive disorder, recurrent, mild: Secondary | ICD-10-CM | POA: Diagnosis not present

## 2023-09-27 ENCOUNTER — Other Ambulatory Visit (HOSPITAL_BASED_OUTPATIENT_CLINIC_OR_DEPARTMENT_OTHER): Payer: Self-pay

## 2023-10-07 ENCOUNTER — Other Ambulatory Visit (HOSPITAL_BASED_OUTPATIENT_CLINIC_OR_DEPARTMENT_OTHER): Payer: Self-pay

## 2023-10-07 ENCOUNTER — Other Ambulatory Visit: Payer: Self-pay | Admitting: Family Medicine

## 2023-10-07 MED ORDER — METFORMIN HCL ER 500 MG PO TB24
1000.0000 mg | ORAL_TABLET | Freq: Every day | ORAL | 2 refills | Status: DC
  Filled 2023-10-07: qty 180, 90d supply, fill #0

## 2023-10-21 ENCOUNTER — Other Ambulatory Visit: Payer: Self-pay

## 2023-10-21 ENCOUNTER — Other Ambulatory Visit (HOSPITAL_BASED_OUTPATIENT_CLINIC_OR_DEPARTMENT_OTHER): Payer: Self-pay

## 2023-10-28 ENCOUNTER — Other Ambulatory Visit: Payer: Self-pay

## 2023-10-28 ENCOUNTER — Other Ambulatory Visit (HOSPITAL_BASED_OUTPATIENT_CLINIC_OR_DEPARTMENT_OTHER): Payer: Self-pay

## 2023-10-28 ENCOUNTER — Other Ambulatory Visit: Payer: Self-pay | Admitting: Family Medicine

## 2023-10-28 MED ORDER — EMPAGLIFLOZIN 25 MG PO TABS
25.0000 mg | ORAL_TABLET | Freq: Every day | ORAL | 1 refills | Status: DC
  Filled 2023-10-28: qty 90, 90d supply, fill #0
  Filled 2024-01-26: qty 90, 90d supply, fill #1

## 2023-10-28 MED ORDER — RYBELSUS 14 MG PO TABS
14.0000 mg | ORAL_TABLET | Freq: Every day | ORAL | 2 refills | Status: DC
  Filled 2023-10-28 – 2023-10-29 (×2): qty 30, 30d supply, fill #0
  Filled 2023-12-02: qty 30, 30d supply, fill #1
  Filled 2024-01-02: qty 30, 30d supply, fill #2

## 2023-10-29 ENCOUNTER — Other Ambulatory Visit (HOSPITAL_BASED_OUTPATIENT_CLINIC_OR_DEPARTMENT_OTHER): Payer: Self-pay

## 2023-11-05 ENCOUNTER — Other Ambulatory Visit (HOSPITAL_BASED_OUTPATIENT_CLINIC_OR_DEPARTMENT_OTHER): Payer: Self-pay

## 2023-11-05 ENCOUNTER — Other Ambulatory Visit: Payer: Self-pay | Admitting: Family Medicine

## 2023-11-05 ENCOUNTER — Encounter: Payer: Self-pay | Admitting: Family Medicine

## 2023-11-05 ENCOUNTER — Ambulatory Visit: Payer: BC Managed Care – PPO | Admitting: Family Medicine

## 2023-11-05 VITALS — BP 132/76 | HR 68 | Resp 20 | Ht 68.0 in | Wt 176.0 lb

## 2023-11-05 DIAGNOSIS — Z7984 Long term (current) use of oral hypoglycemic drugs: Secondary | ICD-10-CM

## 2023-11-05 DIAGNOSIS — E1165 Type 2 diabetes mellitus with hyperglycemia: Secondary | ICD-10-CM | POA: Diagnosis not present

## 2023-11-05 LAB — HEMOGLOBIN A1C: Hgb A1c MFr Bld: 7.3 % — ABNORMAL HIGH (ref 4.6–6.5)

## 2023-11-05 MED ORDER — METFORMIN HCL ER 500 MG PO TB24
1000.0000 mg | ORAL_TABLET | Freq: Two times a day (BID) | ORAL | 2 refills | Status: AC
  Filled 2023-11-05 – 2023-12-19 (×2): qty 360, 90d supply, fill #0
  Filled 2024-04-02: qty 360, 90d supply, fill #1

## 2023-11-05 NOTE — Patient Instructions (Signed)
 Give [place] 2-3 business days to get the results of your labs back.   Keep the diet clean and stay active.  Let us know if you need anything.

## 2023-11-05 NOTE — Progress Notes (Signed)
 Subjective:   Chief Complaint  Patient presents with   Diabetes    Patient presents today for a diabetes follow-up    Shawn Perez is a 58 y.o. male here for follow-up of diabetes.   [name]'s self monitored glucose range is low 100's.  Patient denies hypoglycemic reactions. He checks his glucose levels 1 time(s) per week. Patient does not require insulin.   Medications include: Jardiance 25 mg/d, metformin XR 1000 mg/d, Rybelsus 14 mg/d, Actos 45 mg/d Diet is OK.  Exercise: walking No CP or SOB.   Past Medical History:  Diagnosis Date   Anxiety    Diabetes mellitus without complication (HCC)    type 2   Family history of adverse reaction to anesthesia    [name] has trouble waking up both times under anesthesia diagnosed with sleep apnea   GERD (gastroesophageal reflux disease)    "had 4-5 years ago"   Heart murmur    since birth   Hyperlipidemia 2011   Hypertension    Sleep apnea    wears cpap   Temporomandibular jaw dysfunction      Related testing: Retinal exam: Done Pneumovax: done  Objective:  BP 132/76   Pulse 68   Resp 20   Ht 5\' 8"  (1.727 m)   Wt 176 lb (79.8 kg)   SpO2 97%   BMI 26.76 kg/m  General:  Well developed, well nourished, in no apparent distress Lungs:  CTAB, no access msc use Cardio:  RRR, no bruits, no LE edema Psych: Age appropriate judgment and insight  Assessment:   Type 2 diabetes mellitus with hyperglycemia, without long-term current use of insulin (HCC) - Plan: Hemoglobin A1c   Plan:   Chronic, not stable.  For now, he will continue Actos 45 mg daily, Rybelsus 14 mg daily, metformin XR 1000 mg daily, Jardiance 25 mg daily.  If somewhat improved, would consider increasing metformin from daily to twice daily.  If not controlled, will add glyburide 5 mg daily.  He would prefer to stay away from injectables.  Continue to monitor sugars at home.  Counseled on diet and exercise. F/u in 3-6 mo. The patient voiced understanding and  agreement to the plan.  Jilda Roche [place], DO 11/05/23 8:57 AM

## 2023-11-18 ENCOUNTER — Other Ambulatory Visit: Payer: Self-pay | Admitting: Family Medicine

## 2023-11-18 ENCOUNTER — Other Ambulatory Visit: Payer: Self-pay

## 2023-11-18 ENCOUNTER — Other Ambulatory Visit (HOSPITAL_BASED_OUTPATIENT_CLINIC_OR_DEPARTMENT_OTHER): Payer: Self-pay

## 2023-11-18 MED ORDER — EZETIMIBE 10 MG PO TABS
10.0000 mg | ORAL_TABLET | Freq: Every day | ORAL | 1 refills | Status: DC
Start: 2023-11-18 — End: 2024-05-19
  Filled 2023-11-18: qty 90, 90d supply, fill #0
  Filled 2024-02-17: qty 90, 90d supply, fill #1

## 2023-11-18 MED ORDER — PIOGLITAZONE HCL 45 MG PO TABS
45.0000 mg | ORAL_TABLET | Freq: Every day | ORAL | 1 refills | Status: DC
  Filled 2023-11-18 – 2023-11-20 (×2): qty 90, 90d supply, fill #0
  Filled 2024-02-17: qty 90, 90d supply, fill #1

## 2023-11-19 ENCOUNTER — Other Ambulatory Visit (HOSPITAL_BASED_OUTPATIENT_CLINIC_OR_DEPARTMENT_OTHER): Payer: Self-pay

## 2023-11-19 ENCOUNTER — Other Ambulatory Visit: Payer: Self-pay | Admitting: Family Medicine

## 2023-11-19 MED ORDER — CONTOUR NEXT TEST VI STRP
1.0000 | ORAL_STRIP | Freq: Every day | 12 refills | Status: AC
  Filled 2023-11-19: qty 50, 50d supply, fill #0
  Filled 2024-01-26: qty 50, 50d supply, fill #1

## 2023-11-20 ENCOUNTER — Other Ambulatory Visit (HOSPITAL_BASED_OUTPATIENT_CLINIC_OR_DEPARTMENT_OTHER): Payer: Self-pay

## 2023-11-20 ENCOUNTER — Other Ambulatory Visit: Payer: Self-pay

## 2023-12-06 ENCOUNTER — Other Ambulatory Visit (HOSPITAL_BASED_OUTPATIENT_CLINIC_OR_DEPARTMENT_OTHER): Payer: Self-pay

## 2023-12-06 DIAGNOSIS — F411 Generalized anxiety disorder: Secondary | ICD-10-CM | POA: Diagnosis not present

## 2023-12-06 DIAGNOSIS — F330 Major depressive disorder, recurrent, mild: Secondary | ICD-10-CM | POA: Diagnosis not present

## 2023-12-06 MED ORDER — ARIPIPRAZOLE 10 MG PO TABS
10.0000 mg | ORAL_TABLET | Freq: Every day | ORAL | 0 refills | Status: AC
  Filled 2024-01-14: qty 90, 90d supply, fill #0

## 2023-12-19 ENCOUNTER — Other Ambulatory Visit (HOSPITAL_BASED_OUTPATIENT_CLINIC_OR_DEPARTMENT_OTHER): Payer: Self-pay

## 2023-12-25 ENCOUNTER — Ambulatory Visit: Admitting: Family Medicine

## 2023-12-25 ENCOUNTER — Encounter: Payer: Self-pay | Admitting: Family Medicine

## 2023-12-25 ENCOUNTER — Telehealth: Payer: Self-pay

## 2023-12-25 VITALS — BP 112/70 | HR 99 | Temp 98.1°F | Resp 18 | Ht 68.0 in | Wt 172.0 lb

## 2023-12-25 DIAGNOSIS — R5383 Other fatigue: Secondary | ICD-10-CM

## 2023-12-25 DIAGNOSIS — R413 Other amnesia: Secondary | ICD-10-CM

## 2023-12-25 DIAGNOSIS — R4182 Altered mental status, unspecified: Secondary | ICD-10-CM | POA: Diagnosis not present

## 2023-12-25 NOTE — Telephone Encounter (Signed)
 Initial Comment Needed the office, will call back if needed. office not answering before lunch. Translation No Disp. Time [facility] Time) Disposition Final User 12/25/2023 11:57:20 AM General Information Provided Yes Flordia Hung Final Disposition 12/25/2023 11:57:20 AM General Information Provided Yes Flordia Hung

## 2023-12-25 NOTE — Progress Notes (Signed)
 Chief Complaint  Patient presents with   Fatigue    Onset: 4 days    Subjective: Patient is a 58 y.o. male here for fatigue.  He is here with his wife who helps with the history.  Started 4 d ago.  No obvious triggers or change in activity.  He is eating and drinking normally, may be dehydrated slightly.  He feels both devoid of energy and sleepy.  He is compliant with the CPAP.  He does not feel well rested in the morning.  There have been no changes in his settings.  Mood is stable overall, does seem to have a flat affect per his wife.  No blood in the stool, urine, or diarrhea.  He is not having any shortness of breath.  His wife notices he does seem more mentally confused.  No difficulty swallowing or trouble with speech.  No balance issues or weakness.  Past Medical History:  Diagnosis Date   Anxiety    Diabetes mellitus without complication (HCC)    type 2   Family history of adverse reaction to anesthesia    Dietra Fraction has trouble waking up both times under anesthesia diagnosed with sleep apnea   GERD (gastroesophageal reflux disease)    "had 4-5 years ago"   Heart murmur    since birth   Hyperlipidemia 2011   Hypertension    Sleep apnea    wears cpap   Temporomandibular jaw dysfunction     Objective: BP 112/70   Pulse 99   Temp 98.1 F (36.7 C)   Resp 18   Ht 5\' 8"  (1.727 m)   Wt 172 lb (78 kg)   SpO2 99%   BMI 26.15 kg/m  General: Awake, appears stated age Heart: RRR, no LE edema Lungs: CTAB, no rales, wheezes or rhonchi. No accessory muscle use Mouth: MMM Neuro: Gait is slow but steady.  No cerebellar signs.  DTRs equal and symmetric throughout, no clonus, no cerebellar signs. Psych: Age appropriate judgment and insight, normal affect and mood  Assessment and Plan: Memory change - Plan: B12, Urine Culture, Urine Microscopic Only, EKG 12-Lead  Fatigue, unspecified type - Plan: CBC, Comprehensive metabolic panel with GFR, VITAMIN D  25 Hydroxy (Vit-D Deficiency,  Fractures), TSH, T4, free, B12, Urine Culture, Urine Microscopic Only  Palpitation  Check above.  If negative, would consider MRI brain. Counseled on diet and exercise.  Stay hydrated.  Check labs.  Unlikely to be uncontrolled anxiety/depression.  Unlikely to be uncontrolled sleep apnea. The patient voiced understanding and agreement to the plan.  Shellie Dials [place], DO 12/25/23  4:50 PM

## 2023-12-25 NOTE — Patient Instructions (Addendum)
Give us 2-3 business days to get the results of your labs back.   Stay hydrated.  Let us know if you need anything.  

## 2023-12-25 NOTE — Telephone Encounter (Signed)
 Pt has appt today with Tricia Fuelling pt he stated he is fine now.

## 2023-12-26 ENCOUNTER — Other Ambulatory Visit: Payer: Self-pay | Admitting: Family Medicine

## 2023-12-26 ENCOUNTER — Encounter: Payer: Self-pay | Admitting: Family Medicine

## 2023-12-26 ENCOUNTER — Other Ambulatory Visit (HOSPITAL_BASED_OUTPATIENT_CLINIC_OR_DEPARTMENT_OTHER): Payer: Self-pay

## 2023-12-26 LAB — CBC
HCT: 48.2 % (ref 39.0–52.0)
Hemoglobin: 16.3 g/dL (ref 13.0–17.0)
MCHC: 33.7 g/dL (ref 30.0–36.0)
MCV: 97.5 fl (ref 78.0–100.0)
Platelets: 213 K/uL (ref 150.0–400.0)
RBC: 4.95 Mil/uL (ref 4.22–5.81)
RDW: 13.8 % (ref 11.5–15.5)
WBC: 6.5 K/uL (ref 4.0–10.5)

## 2023-12-26 LAB — COMPREHENSIVE METABOLIC PANEL WITH GFR
ALT: 25 U/L (ref 0–53)
AST: 20 U/L (ref 0–37)
Albumin: 4.5 g/dL (ref 3.5–5.2)
Alkaline Phosphatase: 59 U/L (ref 39–117)
BUN: 22 mg/dL (ref 6–23)
CO2: 29 meq/L (ref 19–32)
Calcium: 9.6 mg/dL (ref 8.4–10.5)
Chloride: 99 meq/L (ref 96–112)
Creatinine, Ser: 1.01 mg/dL (ref 0.40–1.50)
GFR: 81.16 mL/min
Glucose, Bld: 114 mg/dL — ABNORMAL HIGH (ref 70–99)
Potassium: 4.1 meq/L (ref 3.5–5.1)
Sodium: 138 meq/L (ref 135–145)
Total Bilirubin: 1.4 mg/dL — ABNORMAL HIGH (ref 0.2–1.2)
Total Protein: 6.9 g/dL (ref 6.0–8.3)

## 2023-12-26 LAB — URINALYSIS, MICROSCOPIC ONLY

## 2023-12-26 LAB — VITAMIN B12: Vitamin B-12: 162 pg/mL — ABNORMAL LOW (ref 211–911)

## 2023-12-26 LAB — URINE CULTURE
MICRO NUMBER:: 16395172
Result:: NO GROWTH
SPECIMEN QUALITY:: ADEQUATE

## 2023-12-26 LAB — T4, FREE: Free T4: 0.85 ng/dL (ref 0.60–1.60)

## 2023-12-26 LAB — TSH: TSH: 1.68 u[IU]/mL (ref 0.35–5.50)

## 2023-12-26 LAB — VITAMIN D 25 HYDROXY (VIT D DEFICIENCY, FRACTURES): VITD: 23.38 ng/mL — ABNORMAL LOW (ref 30.00–100.00)

## 2023-12-26 MED ORDER — SULFAMETHOXAZOLE-TRIMETHOPRIM 800-160 MG PO TABS
1.0000 | ORAL_TABLET | Freq: Two times a day (BID) | ORAL | 0 refills | Status: AC
Start: 2023-12-26 — End: 2024-01-02
  Filled 2023-12-26: qty 14, 7d supply, fill #0

## 2023-12-27 ENCOUNTER — Other Ambulatory Visit (HOSPITAL_BASED_OUTPATIENT_CLINIC_OR_DEPARTMENT_OTHER): Payer: Self-pay

## 2023-12-27 ENCOUNTER — Other Ambulatory Visit: Payer: Self-pay

## 2023-12-27 ENCOUNTER — Ambulatory Visit (INDEPENDENT_AMBULATORY_CARE_PROVIDER_SITE_OTHER): Admitting: Family Medicine

## 2023-12-27 ENCOUNTER — Ambulatory Visit

## 2023-12-27 ENCOUNTER — Encounter: Payer: Self-pay | Admitting: Family Medicine

## 2023-12-27 DIAGNOSIS — E538 Deficiency of other specified B group vitamins: Secondary | ICD-10-CM

## 2023-12-27 DIAGNOSIS — R5383 Other fatigue: Secondary | ICD-10-CM

## 2023-12-27 MED ORDER — CYANOCOBALAMIN 1000 MCG/ML IJ SOLN
1000.0000 ug | Freq: Once | INTRAMUSCULAR | Status: AC
  Administered 2023-12-27: 1000 ug via INTRAMUSCULAR

## 2023-12-27 MED ORDER — VITAMIN D (ERGOCALCIFEROL) 1.25 MG (50000 UNIT) PO CAPS
50000.0000 [IU] | ORAL_CAPSULE | ORAL | 3 refills | Status: DC
  Filled 2023-12-27: qty 5, 35d supply, fill #0
  Filled 2024-01-21 – 2024-01-22 (×2): qty 5, 35d supply, fill #1
  Filled 2024-03-02: qty 5, 35d supply, fill #2
  Filled 2024-07-16: qty 5, 35d supply, fill #3

## 2023-12-27 NOTE — Progress Notes (Unsigned)
 Pt here for b12 injection. Reportedly tolerated well. Will do 3 more weekly injections and then transition to monthly. Will recheck in future. Hopefully can do at home.  Shawn Perez Shawn Perez 4:03 PM 12/27/23

## 2023-12-27 NOTE — Telephone Encounter (Signed)
 Pt is coming in today for B-12

## 2023-12-27 NOTE — Telephone Encounter (Signed)
 Called pt appt schedule for B-12

## 2023-12-29 LAB — INTRINSIC FACTOR ANTIBODIES: Intrinsic Factor: NEGATIVE

## 2023-12-31 ENCOUNTER — Ambulatory Visit: Admitting: Family Medicine

## 2024-01-03 ENCOUNTER — Ambulatory Visit (INDEPENDENT_AMBULATORY_CARE_PROVIDER_SITE_OTHER): Admitting: *Deleted

## 2024-01-03 DIAGNOSIS — E538 Deficiency of other specified B group vitamins: Secondary | ICD-10-CM | POA: Diagnosis not present

## 2024-01-03 MED ORDER — CYANOCOBALAMIN 1000 MCG/ML IJ SOLN
1000.0000 ug | Freq: Once | INTRAMUSCULAR | Status: AC
  Administered 2024-01-03: 1000 ug via INTRAMUSCULAR

## 2024-01-03 NOTE — Progress Notes (Signed)
 Patient here for 2/4 weekly b12 injection per physicians order.  Injection given in left deltoid and patient tolerated well.  Next appointment scheduled for 01/09/24.

## 2024-01-09 ENCOUNTER — Ambulatory Visit (INDEPENDENT_AMBULATORY_CARE_PROVIDER_SITE_OTHER)

## 2024-01-09 DIAGNOSIS — E538 Deficiency of other specified B group vitamins: Secondary | ICD-10-CM | POA: Diagnosis not present

## 2024-01-09 MED ORDER — CYANOCOBALAMIN 1000 MCG/ML IJ SOLN
1000.0000 ug | Freq: Once | INTRAMUSCULAR | Status: AC
  Administered 2024-01-09: 1000 ug via INTRAMUSCULAR

## 2024-01-09 NOTE — Progress Notes (Signed)
 Pt here for monthly B12 injection per original order dated: 12/26/23 on patient message: you are required to have shots of B12  This will be B12 # 3 of 4 weekly.  Last B12 injection: 01/03/24  Last B12 level:  162 on 12/25/23  B12 1000mcg given IM, and pt tolerated injection well.  Next B12 injection scheduled for:  01/16/24 for B12 4 of 4 weekly.

## 2024-01-14 ENCOUNTER — Other Ambulatory Visit (HOSPITAL_BASED_OUTPATIENT_CLINIC_OR_DEPARTMENT_OTHER): Payer: Self-pay

## 2024-01-16 ENCOUNTER — Ambulatory Visit (INDEPENDENT_AMBULATORY_CARE_PROVIDER_SITE_OTHER)

## 2024-01-16 DIAGNOSIS — E538 Deficiency of other specified B group vitamins: Secondary | ICD-10-CM

## 2024-01-16 MED ORDER — CYANOCOBALAMIN 1000 MCG/ML IJ SOLN
1000.0000 ug | Freq: Once | INTRAMUSCULAR | Status: AC
  Administered 2024-01-16: 1000 ug via INTRAMUSCULAR

## 2024-01-16 NOTE — Progress Notes (Signed)
 Pt here for monthly B12 injection per original order dated:  12/26/23 on patient message: you are required to have shots of B12  This will be B12 # 4 of 4 weekly.  Last B12 injection:01/09/24  Last B12 level:  162 on 12/25/23  B12 1000mcg given IM, and pt tolerated injection well.  Next B12 injection scheduled for: 02/18/24

## 2024-01-17 ENCOUNTER — Ambulatory Visit

## 2024-01-21 ENCOUNTER — Other Ambulatory Visit (HOSPITAL_BASED_OUTPATIENT_CLINIC_OR_DEPARTMENT_OTHER): Payer: Self-pay

## 2024-01-24 ENCOUNTER — Ambulatory Visit

## 2024-01-27 ENCOUNTER — Other Ambulatory Visit: Payer: Self-pay | Admitting: Family Medicine

## 2024-01-27 ENCOUNTER — Other Ambulatory Visit (HOSPITAL_BASED_OUTPATIENT_CLINIC_OR_DEPARTMENT_OTHER): Payer: Self-pay

## 2024-01-27 MED ORDER — RYBELSUS 14 MG PO TABS
14.0000 mg | ORAL_TABLET | Freq: Every day | ORAL | 2 refills | Status: DC
  Filled 2024-01-27: qty 30, 30d supply, fill #0
  Filled 2024-03-02: qty 30, 30d supply, fill #1
  Filled 2024-04-02: qty 30, 30d supply, fill #2

## 2024-01-30 DIAGNOSIS — S1091XA Abrasion of unspecified part of neck, initial encounter: Secondary | ICD-10-CM | POA: Diagnosis not present

## 2024-02-05 ENCOUNTER — Ambulatory Visit: Admitting: Family Medicine

## 2024-02-14 ENCOUNTER — Encounter: Payer: Self-pay | Admitting: Family Medicine

## 2024-02-14 ENCOUNTER — Ambulatory Visit: Payer: Self-pay | Admitting: Family Medicine

## 2024-02-14 ENCOUNTER — Ambulatory Visit: Admitting: Family Medicine

## 2024-02-14 VITALS — BP 120/74 | HR 68 | Temp 98.0°F | Resp 16 | Ht 68.0 in | Wt 166.0 lb

## 2024-02-14 DIAGNOSIS — E785 Hyperlipidemia, unspecified: Secondary | ICD-10-CM

## 2024-02-14 DIAGNOSIS — E538 Deficiency of other specified B group vitamins: Secondary | ICD-10-CM | POA: Diagnosis not present

## 2024-02-14 DIAGNOSIS — Z7984 Long term (current) use of oral hypoglycemic drugs: Secondary | ICD-10-CM | POA: Diagnosis not present

## 2024-02-14 DIAGNOSIS — E1165 Type 2 diabetes mellitus with hyperglycemia: Secondary | ICD-10-CM | POA: Diagnosis not present

## 2024-02-14 LAB — COMPREHENSIVE METABOLIC PANEL WITH GFR
ALT: 44 U/L (ref 0–53)
AST: 32 U/L (ref 0–37)
Albumin: 4.7 g/dL (ref 3.5–5.2)
Alkaline Phosphatase: 52 U/L (ref 39–117)
BUN: 22 mg/dL (ref 6–23)
CO2: 31 meq/L (ref 19–32)
Calcium: 10.1 mg/dL (ref 8.4–10.5)
Chloride: 102 meq/L (ref 96–112)
Creatinine, Ser: 0.85 mg/dL (ref 0.40–1.50)
GFR: 94.74 mL/min (ref >60.00)
Glucose, Bld: 94 mg/dL (ref 70–99)
Potassium: 4.8 meq/L (ref 3.5–5.1)
Sodium: 142 meq/L (ref 135–145)
Total Bilirubin: 1.4 mg/dL — ABNORMAL HIGH (ref 0.2–1.2)
Total Protein: 6.8 g/dL (ref 6.0–8.3)

## 2024-02-14 LAB — HEMOGLOBIN A1C: Hgb A1c MFr Bld: 7 % — ABNORMAL HIGH (ref 4.6–6.5)

## 2024-02-14 LAB — LIPID PANEL
Cholesterol: 56 mg/dL (ref 0–200)
HDL: 26.1 mg/dL — ABNORMAL LOW (ref >39.00)
LDL Cholesterol: 16 mg/dL (ref 0–99)
NonHDL: 30.04
Total CHOL/HDL Ratio: 2
Triglycerides: 153 mg/dL — ABNORMAL HIGH (ref 0.0–149.0)
VLDL: 30.6 mg/dL (ref 0.0–40.0)

## 2024-02-14 MED ORDER — CYANOCOBALAMIN 1000 MCG/ML IJ SOLN
1000.0000 ug | Freq: Once | INTRAMUSCULAR | Status: AC
  Administered 2024-02-14: 1000 ug via INTRAMUSCULAR

## 2024-02-14 NOTE — Progress Notes (Signed)
 Pt here for visit and had 1st month B-12 Injections givn in LD Did well, Pt schedule next month B-12.

## 2024-02-14 NOTE — Patient Instructions (Addendum)
Give us 2-3 business days to get the results of your labs back.   Keep the diet clean and stay active.  Aim to do some physical exertion for 150 minutes per week. This is typically divided into 5 days per week, 30 minutes per day. The activity should be enough to get your heart rate up. Anything is better than nothing if you have time constraints.  Let us know if you need anything.  

## 2024-02-14 NOTE — Progress Notes (Signed)
 Subjective:   Chief Complaint  Patient presents with   Follow-up    Follow Up    Shawn Perez is a 58 y.o. male here for follow-up of diabetes.   Paxtyn's self monitored glucose range is low-mid 100's.  Patient denies hypoglycemic reactions. He checks his glucose levels 2 time(s) per week. Patient does not require insulin .   Medications include: Actos  45 mg/d, Metformin  XR 1000 mg bid, Jardiance  25 mg daily, Rybelsus  14 mg daily. Diet is healthy.  Exercise: none  Hyperlipidemia Patient presents for dyslipidemia follow up. Currently being treated with Zetia  10 mg/d,  and compliance with treatment thus far has been good. He denies myalgias. Diet/exercise as above. No CP or SOB.  The patient is not known to have coexisting coronary artery disease.  Past Medical History:  Diagnosis Date   Anxiety    Diabetes mellitus without complication (HCC)    type 2   Family history of adverse reaction to anesthesia    Dietra Fraction has trouble waking up both times under anesthesia diagnosed with sleep apnea   GERD (gastroesophageal reflux disease)    had 4-5 years ago   Heart murmur    since birth   Hyperlipidemia 2011   Hypertension    Sleep apnea    wears cpap   Temporomandibular jaw dysfunction      Related testing: Retinal exam: Done Pneumovax: politely declines.   Objective:  BP 120/74 (BP Location: Left Arm, Patient Position: Sitting)   Pulse 68   Temp 98 F (36.7 C) (Oral)   Resp 16   Ht 5' 8 (1.727 m)   Wt 166 lb (75.3 kg)   SpO2 97%   BMI 25.24 kg/m  General:  Well developed, well nourished, in no apparent distress Lungs:  CTAB, no access msc use Cardio:  RRR, no bruits, no LE edema Psych: Age appropriate judgment and insight  Assessment:   Type 2 diabetes mellitus with hyperglycemia, without long-term current use of insulin  (HCC) - Plan: Comprehensive metabolic panel with GFR, Hemoglobin A1c, Lipid panel  Hyperlipidemia LDL goal <70  B12 deficiency -  Plan: cyanocobalamin  (VITAMIN B12) injection 1,000 mcg   Plan:   Chronic, hopefully stable.  Continue metformin  1000 mg twice daily, Actos  45 mg daily, Jardiance  25 mg daily, Rybelsus  14 mg daily.  Counseled on diet and exercise. Chronic, stable.  Continue Zetia  10 mg daily, Crestor  40 mg daily. F/u in 6 mo. The patient voiced understanding and agreement to the plan.  Shellie Dials [place], DO 02/14/24 9:54 AM

## 2024-02-18 ENCOUNTER — Ambulatory Visit

## 2024-03-02 ENCOUNTER — Ambulatory Visit: Admitting: Family Medicine

## 2024-03-02 ENCOUNTER — Encounter: Payer: Self-pay | Admitting: Family Medicine

## 2024-03-02 VITALS — BP 118/74 | HR 84 | Temp 98.0°F | Resp 16 | Ht 68.0 in | Wt 168.0 lb

## 2024-03-02 DIAGNOSIS — N5089 Other specified disorders of the male genital organs: Secondary | ICD-10-CM | POA: Diagnosis not present

## 2024-03-02 DIAGNOSIS — H401131 Primary open-angle glaucoma, bilateral, mild stage: Secondary | ICD-10-CM | POA: Diagnosis not present

## 2024-03-02 LAB — HM DIABETES EYE EXAM

## 2024-03-02 NOTE — Progress Notes (Signed)
 Chief Complaint  Patient presents with   Mass    Small Bump Right Scrotum    Shawn Perez is a 58 y.o. male here for a skin complaint.  Duration: 1 week Location: R portion of scrotum Pruritic? No Painful? No Drainage? No New soaps/lotions/topicals/detergents? No Sick contacts? No Other associated symptoms: no fevers or spreading/changing, no urinary complaints.  Therapies tried thus far: none  Past Medical History:  Diagnosis Date   Anxiety    Diabetes mellitus without complication (HCC)    type 2   Family history of adverse reaction to anesthesia    [name] has trouble waking up both times under anesthesia diagnosed with sleep apnea   GERD (gastroesophageal reflux disease)    had 4-5 years ago   Heart murmur    since birth   Hyperlipidemia 2011   Hypertension    Sleep apnea    wears cpap   Temporomandibular jaw dysfunction     BP 118/74 (BP Location: Left Arm, Patient Position: Sitting)   Pulse 84   Temp 98 F (36.7 C) (Oral)   Resp 16   Ht 5' 8 (1.727 m)   Wt 168 lb (76.2 kg)   SpO2 95%   BMI 25.54 kg/m  Gen: awake, alert, appearing stated age Lungs: No accessory muscle use Skin: pt notes he feels a small mass when I'm palpating the epididymis. No drainage, erythema, TTP, fluctuance, excoriation Psych: Age appropriate judgment and insight  Lump in the testicle - Plan: US  Scrotum  Ck US . I do not appreciate anything on exam. Ice/Tylenol  prn.  F/u prn. The patient voiced understanding and agreement to the plan.  Mabel Mt [place], DO 03/02/24 2:41 PM

## 2024-03-02 NOTE — Patient Instructions (Signed)
 We will be in touch with your ultrasound results.   Ice/cold pack over area for 10-15 min twice daily.  OK to take Tylenol  1000 mg (2 extra strength tabs) or 975 mg (3 regular strength tabs) every 6 hours as needed.  Let us  know if you need anything.

## 2024-03-06 DIAGNOSIS — F330 Major depressive disorder, recurrent, mild: Secondary | ICD-10-CM | POA: Diagnosis not present

## 2024-03-06 DIAGNOSIS — F411 Generalized anxiety disorder: Secondary | ICD-10-CM | POA: Diagnosis not present

## 2024-03-07 ENCOUNTER — Ambulatory Visit (HOSPITAL_BASED_OUTPATIENT_CLINIC_OR_DEPARTMENT_OTHER)
Admission: RE | Admit: 2024-03-07 | Discharge: 2024-03-07 | Disposition: A | Discharge status: Home / Self Care | Source: Ambulatory Visit | Attending: Family Medicine

## 2024-03-07 DIAGNOSIS — N5089 Other specified disorders of the male genital organs: Secondary | ICD-10-CM | POA: Insufficient documentation

## 2024-03-07 DIAGNOSIS — I861 Scrotal varices: Secondary | ICD-10-CM | POA: Diagnosis not present

## 2024-03-07 DIAGNOSIS — N433 Hydrocele, unspecified: Secondary | ICD-10-CM | POA: Diagnosis not present

## 2024-03-09 ENCOUNTER — Ambulatory Visit: Payer: Self-pay | Admitting: Family Medicine

## 2024-03-18 ENCOUNTER — Ambulatory Visit (INDEPENDENT_AMBULATORY_CARE_PROVIDER_SITE_OTHER)

## 2024-03-18 DIAGNOSIS — E538 Deficiency of other specified B group vitamins: Secondary | ICD-10-CM

## 2024-03-18 MED ORDER — CYANOCOBALAMIN 1000 MCG/ML IJ SOLN
1000.0000 ug | Freq: Once | INTRAMUSCULAR | Status: AC
  Administered 2024-03-18: 1000 ug via INTRAMUSCULAR

## 2024-03-18 NOTE — Progress Notes (Signed)
 Pt here for monthly B12 injection per original order dated: 12/25/23  Last B12 injection:01/16/24  Last B12 level:  12/25/23  B12 1000mcg given IM, and pt tolerated injection well by Gerardine LABOR, CMA student -- with supervision of Chiquita Sayres, CMA  Next B12 injection scheduled for: next month

## 2024-03-23 ENCOUNTER — Telehealth: Payer: Self-pay

## 2024-03-23 NOTE — Telephone Encounter (Signed)
 Copied from CRM [id number]. Topic: Clinical - Request for Lab/Test Order >> Mar 23, 2024  4:32 PM Lavanda D wrote: Reason for CRM: Patient looking to schedule his upcoming monthly b12, order not yet in for the b12.

## 2024-03-23 NOTE — Telephone Encounter (Signed)
 Called pt B-12 Monthly scheduled.

## 2024-04-17 ENCOUNTER — Ambulatory Visit (INDEPENDENT_AMBULATORY_CARE_PROVIDER_SITE_OTHER)

## 2024-04-17 DIAGNOSIS — E538 Deficiency of other specified B group vitamins: Secondary | ICD-10-CM | POA: Diagnosis not present

## 2024-04-17 MED ORDER — CYANOCOBALAMIN 1000 MCG/ML IJ SOLN
1000.0000 ug | Freq: Once | INTRAMUSCULAR | Status: AC
Start: 2024-04-17 — End: 2024-04-17
  Administered 2024-04-17: 1000 ug via INTRAMUSCULAR

## 2024-04-17 NOTE — Progress Notes (Signed)
         Pt here for monthly B12 injection per original order dated: 12/25/23   Last B12 injection:03/18/24   Last B12 level:  12/25/23   B12 1000mcg given IM, and pt tolerated injection well.   Next B12 injection scheduled for: next month

## 2024-04-20 ENCOUNTER — Encounter (HOSPITAL_BASED_OUTPATIENT_CLINIC_OR_DEPARTMENT_OTHER): Payer: Self-pay

## 2024-04-20 ENCOUNTER — Other Ambulatory Visit: Payer: Self-pay | Admitting: Family Medicine

## 2024-04-20 ENCOUNTER — Other Ambulatory Visit (HOSPITAL_BASED_OUTPATIENT_CLINIC_OR_DEPARTMENT_OTHER): Payer: Self-pay

## 2024-04-20 ENCOUNTER — Other Ambulatory Visit: Payer: Self-pay

## 2024-04-20 MED ORDER — EMPAGLIFLOZIN 25 MG PO TABS
25.0000 mg | ORAL_TABLET | Freq: Every day | ORAL | 1 refills | Status: AC
  Filled 2024-04-20: qty 90, 90d supply, fill #0
  Filled 2024-07-16: qty 90, 90d supply, fill #1

## 2024-04-21 ENCOUNTER — Other Ambulatory Visit (HOSPITAL_BASED_OUTPATIENT_CLINIC_OR_DEPARTMENT_OTHER): Payer: Self-pay

## 2024-04-21 MED ORDER — ARIPIPRAZOLE 10 MG PO TABS
10.0000 mg | ORAL_TABLET | Freq: Every day | ORAL | 0 refills | Status: DC
  Filled 2024-04-21: qty 90, 90d supply, fill #0

## 2024-04-22 ENCOUNTER — Other Ambulatory Visit (HOSPITAL_BASED_OUTPATIENT_CLINIC_OR_DEPARTMENT_OTHER): Payer: Self-pay

## 2024-05-03 ENCOUNTER — Other Ambulatory Visit: Payer: Self-pay | Admitting: Family Medicine

## 2024-05-04 ENCOUNTER — Other Ambulatory Visit (HOSPITAL_BASED_OUTPATIENT_CLINIC_OR_DEPARTMENT_OTHER): Payer: Self-pay

## 2024-05-04 ENCOUNTER — Other Ambulatory Visit: Payer: Self-pay

## 2024-05-04 MED ORDER — RYBELSUS 14 MG PO TABS
14.0000 mg | ORAL_TABLET | Freq: Every day | ORAL | 2 refills | Status: DC
  Filled 2024-05-04: qty 30, 30d supply, fill #0
  Filled 2024-06-02: qty 30, 30d supply, fill #1
  Filled 2024-07-01: qty 30, 30d supply, fill #2

## 2024-05-19 ENCOUNTER — Other Ambulatory Visit (HOSPITAL_BASED_OUTPATIENT_CLINIC_OR_DEPARTMENT_OTHER): Payer: Self-pay

## 2024-05-19 ENCOUNTER — Other Ambulatory Visit: Payer: Self-pay

## 2024-05-19 ENCOUNTER — Other Ambulatory Visit: Payer: Self-pay | Admitting: Family Medicine

## 2024-05-19 MED ORDER — EZETIMIBE 10 MG PO TABS
10.0000 mg | ORAL_TABLET | Freq: Every day | ORAL | 1 refills | Status: AC
  Filled 2024-05-19: qty 90, 90d supply, fill #0
  Filled 2024-08-03: qty 90, 90d supply, fill #1

## 2024-05-19 MED ORDER — PIOGLITAZONE HCL 45 MG PO TABS
45.0000 mg | ORAL_TABLET | Freq: Every day | ORAL | 1 refills | Status: AC
  Filled 2024-05-19: qty 90, 90d supply, fill #0
  Filled 2024-08-23: qty 90, 90d supply, fill #1

## 2024-05-20 ENCOUNTER — Ambulatory Visit (INDEPENDENT_AMBULATORY_CARE_PROVIDER_SITE_OTHER): Admitting: *Deleted

## 2024-05-20 DIAGNOSIS — E538 Deficiency of other specified B group vitamins: Secondary | ICD-10-CM

## 2024-05-20 MED ORDER — CYANOCOBALAMIN 1000 MCG/ML IJ SOLN
1000.0000 ug | Freq: Once | INTRAMUSCULAR | Status: AC
  Administered 2024-05-20: 1000 ug via INTRAMUSCULAR

## 2024-05-20 NOTE — Progress Notes (Signed)
 Pt in for monthly b12 injections per Dr. Frann.  Given LD without complications.  Next injection 06/19/24.

## 2024-06-01 ENCOUNTER — Other Ambulatory Visit (HOSPITAL_BASED_OUTPATIENT_CLINIC_OR_DEPARTMENT_OTHER): Payer: Self-pay

## 2024-06-01 DIAGNOSIS — F330 Major depressive disorder, recurrent, mild: Secondary | ICD-10-CM | POA: Diagnosis not present

## 2024-06-01 DIAGNOSIS — F411 Generalized anxiety disorder: Secondary | ICD-10-CM | POA: Diagnosis not present

## 2024-06-01 MED ORDER — ARIPIPRAZOLE 10 MG PO TABS
10.0000 mg | ORAL_TABLET | Freq: Every day | ORAL | 0 refills | Status: DC
  Filled 2024-07-16: qty 90, 90d supply, fill #0

## 2024-06-08 ENCOUNTER — Telehealth: Payer: Self-pay

## 2024-06-08 ENCOUNTER — Other Ambulatory Visit (HOSPITAL_COMMUNITY): Payer: Self-pay

## 2024-06-08 NOTE — Telephone Encounter (Signed)
 Pharmacy Patient Advocate Encounter   Received notification from CoverMyMeds that prior authorization for Rybelsus  7MG  tablets IS DUE FOR RENEWAL.   Insurance verification completed.   The patient is insured through [facility].  Action: PA required; PA submitted to above mentioned insurance via Latent Key/confirmation #/EOC [id number] Status is pending

## 2024-06-10 NOTE — Telephone Encounter (Signed)
 Pharmacy Patient Advocate Encounter  Received notification from [facility] [place] that Prior Authorization for Rybelsus  7MG  tablets   has been APPROVED from 06/09/2024 to 06/09/2025   PA #/Case ID/Reference #: [id number]

## 2024-06-19 ENCOUNTER — Ambulatory Visit (INDEPENDENT_AMBULATORY_CARE_PROVIDER_SITE_OTHER)

## 2024-06-19 DIAGNOSIS — E538 Deficiency of other specified B group vitamins: Secondary | ICD-10-CM | POA: Diagnosis not present

## 2024-06-19 MED ORDER — CYANOCOBALAMIN 1000 MCG/ML IJ SOLN
1000.0000 ug | Freq: Once | INTRAMUSCULAR | Status: AC
  Administered 2024-06-19: 1000 ug via INTRAMUSCULAR

## 2024-06-19 NOTE — Progress Notes (Signed)
 Pt here for monthly B12 injection  PCP  Last B12 injection: 05/20/2024  Last B12 level: 12/25/2023  B12 1000mcg given IM L deltoid, and pt tolerated injection well.  Next B12 injection scheduled for: 06/19/2024

## 2024-07-16 ENCOUNTER — Other Ambulatory Visit: Payer: Self-pay | Admitting: Family Medicine

## 2024-07-16 ENCOUNTER — Other Ambulatory Visit (HOSPITAL_BASED_OUTPATIENT_CLINIC_OR_DEPARTMENT_OTHER): Payer: Self-pay

## 2024-07-16 MED ORDER — ROSUVASTATIN CALCIUM 40 MG PO TABS
40.0000 mg | ORAL_TABLET | Freq: Every day | ORAL | 3 refills | Status: AC
  Filled 2024-07-16: qty 90, 90d supply, fill #0

## 2024-07-20 ENCOUNTER — Ambulatory Visit (INDEPENDENT_AMBULATORY_CARE_PROVIDER_SITE_OTHER)

## 2024-07-20 DIAGNOSIS — E538 Deficiency of other specified B group vitamins: Secondary | ICD-10-CM

## 2024-07-20 MED ORDER — CYANOCOBALAMIN 1000 MCG/ML IJ SOLN
1000.0000 ug | Freq: Once | INTRAMUSCULAR | Status: AC
  Administered 2024-07-20: 1000 ug via INTRAMUSCULAR

## 2024-07-20 NOTE — Progress Notes (Signed)
 Pt here for monthly B12 injection per original order dated: 12/26/23 to come in for B12 injections  Last B12 injection:06/19/24  Last B12 level: 12/25/23 was 162  B12 given IM left deltoid, and pt tolerated injection well.  Next B12 injection will be done during scheduled appointment with provider on 08/25/24 per patient's preference.

## 2024-08-03 ENCOUNTER — Other Ambulatory Visit: Payer: Self-pay | Admitting: Family Medicine

## 2024-08-03 ENCOUNTER — Other Ambulatory Visit (HOSPITAL_BASED_OUTPATIENT_CLINIC_OR_DEPARTMENT_OTHER): Payer: Self-pay

## 2024-08-03 ENCOUNTER — Other Ambulatory Visit: Payer: Self-pay

## 2024-08-03 MED ORDER — RYBELSUS 14 MG PO TABS
14.0000 mg | ORAL_TABLET | Freq: Every day | ORAL | 0 refills | Status: AC
  Filled 2024-08-03: qty 90, 90d supply, fill #0

## 2024-08-23 ENCOUNTER — Other Ambulatory Visit: Payer: Self-pay | Admitting: Family Medicine

## 2024-08-24 ENCOUNTER — Other Ambulatory Visit: Payer: Self-pay

## 2024-08-24 ENCOUNTER — Other Ambulatory Visit (HOSPITAL_BASED_OUTPATIENT_CLINIC_OR_DEPARTMENT_OTHER): Payer: Self-pay

## 2024-08-24 MED ORDER — VITAMIN D (ERGOCALCIFEROL) 1.25 MG (50000 UNIT) PO CAPS
50000.0000 [IU] | ORAL_CAPSULE | ORAL | 3 refills | Status: AC
  Filled 2024-08-24: qty 5, 35d supply, fill #0

## 2024-08-25 ENCOUNTER — Other Ambulatory Visit: Payer: Self-pay

## 2024-08-25 ENCOUNTER — Ambulatory Visit: Payer: Self-pay | Admitting: Family Medicine

## 2024-08-25 ENCOUNTER — Ambulatory Visit (INDEPENDENT_AMBULATORY_CARE_PROVIDER_SITE_OTHER): Admitting: Family Medicine

## 2024-08-25 VITALS — BP 120/78 | HR 87 | Temp 97.9°F | Resp 16 | Ht 68.0 in | Wt 167.2 lb

## 2024-08-25 DIAGNOSIS — Z23 Encounter for immunization: Secondary | ICD-10-CM

## 2024-08-25 DIAGNOSIS — E538 Deficiency of other specified B group vitamins: Secondary | ICD-10-CM

## 2024-08-25 DIAGNOSIS — Z0000 Encounter for general adult medical examination without abnormal findings: Secondary | ICD-10-CM

## 2024-08-25 DIAGNOSIS — E1165 Type 2 diabetes mellitus with hyperglycemia: Secondary | ICD-10-CM

## 2024-08-25 DIAGNOSIS — Z125 Encounter for screening for malignant neoplasm of prostate: Secondary | ICD-10-CM | POA: Diagnosis not present

## 2024-08-25 DIAGNOSIS — R748 Abnormal levels of other serum enzymes: Secondary | ICD-10-CM

## 2024-08-25 LAB — MICROALBUMIN / CREATININE URINE RATIO
Creatinine,U: 65 mg/dL
Microalb Creat Ratio: UNDETERMINED mg/g (ref 0.0–30.0)
Microalb, Ur: <0.7 mg/dL

## 2024-08-25 LAB — COMPREHENSIVE METABOLIC PANEL WITH GFR
ALT: 65 U/L — ABNORMAL HIGH (ref 3–53)
AST: 46 U/L — ABNORMAL HIGH (ref 5–37)
Albumin: 4.5 g/dL (ref 3.5–5.2)
Alkaline Phosphatase: 51 U/L (ref 39–117)
BUN: 18 mg/dL (ref 6–23)
CO2: 33 meq/L — ABNORMAL HIGH (ref 19–32)
Calcium: 9.5 mg/dL (ref 8.4–10.5)
Chloride: 102 meq/L (ref 96–112)
Creatinine, Ser: 0.79 mg/dL (ref 0.40–1.50)
GFR: 96.49 mL/min
Glucose, Bld: 111 mg/dL — ABNORMAL HIGH (ref 70–99)
Potassium: 4.6 meq/L (ref 3.5–5.1)
Sodium: 143 meq/L (ref 135–145)
Total Bilirubin: 1.1 mg/dL (ref 0.2–1.2)
Total Protein: 6.6 g/dL (ref 6.0–8.3)

## 2024-08-25 LAB — CBC
HCT: 46.3 % (ref 39.0–52.0)
Hemoglobin: 15.5 g/dL (ref 13.0–17.0)
MCHC: 33.5 g/dL (ref 30.0–36.0)
MCV: 95.6 fl (ref 78.0–100.0)
Platelets: 202 K/uL (ref 150.0–400.0)
RBC: 4.84 Mil/uL (ref 4.22–5.81)
RDW: 13.6 % (ref 11.5–15.5)
WBC: 4.6 K/uL (ref 4.0–10.5)

## 2024-08-25 LAB — LIPID PANEL
Cholesterol: 64 mg/dL (ref 28–200)
HDL: 34.2 mg/dL — ABNORMAL LOW
LDL Cholesterol: 9 mg/dL — ABNORMAL LOW (ref 10–99)
NonHDL: 29.7
Total CHOL/HDL Ratio: 2
Triglycerides: 106 mg/dL (ref 10.0–149.0)
VLDL: 21.2 mg/dL (ref 0.0–40.0)

## 2024-08-25 LAB — PSA: PSA: 0.77 ng/mL (ref 0.10–4.00)

## 2024-08-25 LAB — HEMOGLOBIN A1C: Hgb A1c MFr Bld: 6.7 % — ABNORMAL HIGH (ref 4.6–6.5)

## 2024-08-25 MED ORDER — CYANOCOBALAMIN 1000 MCG/ML IJ SOLN
1000.0000 ug | Freq: Once | INTRAMUSCULAR | Status: AC
  Administered 2024-08-25: 1000 ug via INTRAMUSCULAR

## 2024-08-25 NOTE — Patient Instructions (Addendum)
 Give [place] 2-3 business days to get the results of your labs back.   Keep the diet clean and stay active.  Please consider adding some weight resistance exercise to your routine. Consider yoga as well.   Please get me a copy of your advanced directive form at your convenience.   The Shingrix vaccine (for shingles) is a 2 shot series spaced 2-6 months apart. It can make people feel low energy, achy and almost like they have the flu for 48 hours after injection. 1/5 people can have nausea and/or vomiting. Please plan accordingly when deciding on when to get this shot. Call our office for a nurse visit appointment to get this. The second shot of the series is less severe regarding the side effects, but it still lasts 48 hours.   Let us know if you need anything.

## 2024-08-25 NOTE — Progress Notes (Signed)
 Pt here for monthly B12 injection per original order dated: 12/26/23 to come in for B12 injections  Last B12 injection:08/25/2024  Last B12 level: 12/25/23 was 162  B12 given IM left deltoid, and pt tolerated injection well.  Next B12 injection will be done during scheduled appointment with provider.

## 2024-08-25 NOTE — Progress Notes (Signed)
 Chief Complaint  Patient presents with   Annual Exam    CPE    Well Male Shawn Perez is here for a complete physical.   His last physical was >1 year ago.  Current diet: in general, a healthy diet.  Current exercise: walking Weight trend: stable Fatigue out of ordinary? No. Seat belt? Yes.   Advanced directive? No  Health maintenance Shingrix- No Colonoscopy- Yes Tetanus- Yes HIV- Yes Hep C- Yes   Past Medical History:  Diagnosis Date   Anxiety    Diabetes mellitus without complication (HCC)    type 2   Family history of adverse reaction to anesthesia    Grandson has trouble waking up both times under anesthesia diagnosed with sleep apnea   GERD (gastroesophageal reflux disease)    had 4-5 years ago   Heart murmur    since birth   Hyperlipidemia 2011   Hypertension    Sleep apnea    wears cpap   Temporomandibular jaw dysfunction       Past Surgical History:  Procedure Laterality Date   COLONOSCOPY  1983   COLONOSCOPY WITH PROPOFOL   10/24/2021   INGUINAL HERNIA REPAIR N/A 04/09/2018   Procedure: LAPAROSCOPIC RIGHT INGUINAL AND BILATERAL FEMORAL HERNIA REPAIR;  Surgeon: Sheldon Standing, MD;  Location: [facility];  Service: General;  Laterality: N/A;   INSERTION OF MESH N/A 04/09/2018   Procedure: INSERTION OF MESH;  Surgeon: Sheldon Standing, MD;  Location: [facility];  Service: General;  Laterality: N/A;   LAPAROSCOPIC RIGHT HEMI COLECTOMY Right 12/20/2021   Procedure: LAPAROSCOPIC RIGHT HEMI COLECTOMY;  Surgeon: Teresa Lonni HERO, MD;  Location: [facility];  Service: General;  Laterality: Right;   NO PAST SURGERIES  07/09/2011   Denies surgical history   WISDOM TOOTH EXTRACTION      Medications  Medications Ordered Prior to Encounter[1]   Allergies Allergies[2]  Family History Family History  Problem Relation Age of Onset   Hypertension Mother    Diabetes Mother    Early death Mother    Heart disease Mother    Colon cancer Father    Heart disease Sister     Early death Brother    Heart disease Brother    Hyperlipidemia Brother    Hypertension Brother    Stomach cancer Brother    Heart disease Brother    Early death Maternal Aunt    Heart disease Maternal Aunt    Early death Maternal Uncle    Heart disease Maternal Uncle    Heart disease Maternal Grandmother    Early death Maternal Grandmother    Esophageal cancer Maternal Grandfather    Rectal cancer Neg Hx    Colon polyps Neg Hx     Review of Systems: Constitutional:  no fevers Eye:  no recent significant change in vision Ear/Nose/Mouth/Throat:  Ears:  no hearing loss Nose/Mouth/Throat:  no complaints of nasal congestion, no sore throat Cardiovascular:  no chest pain Respiratory:  no shortness of breath Gastrointestinal:  no change in bowel habits GU:  Male: negative for dysuria, frequency Musculoskeletal/Extremities:  no joint pain Integumentary (Skin/Breast):  no abnormal skin lesions reported Neurologic:  no headaches Endocrine: No unexpected weight changes Hematologic/Lymphatic:  no abnormal bleeding  Exam BP 120/78 (BP Location: Left Arm, Patient Position: Sitting)   Pulse 87   Temp 97.9 F (36.6 C) (Oral)   Resp 16   Ht 5' 8 (1.727 m)   Wt 167 lb 3.2 oz (75.8 kg)   SpO2 95%  BMI 25.42 kg/m  General:  well developed, well nourished, in no apparent distress Skin:  no significant moles, warts, or growths Head:  no masses, lesions, or tenderness Eyes:  pupils equal and round, sclera anicteric without injection Ears:  canals without lesions, TMs shiny without retraction, no obvious effusion, no erythema Nose:  nares patent, mucosa normal Throat/Pharynx:  lips and gingiva without lesion; tongue and uvula midline; non-inflamed pharynx; no exudates or postnasal drainage Neck: neck supple without adenopathy, thyromegaly, or masses Cardiac: RRR, no bruits, no LE edema Lungs:  clear to auscultation, breath sounds equal bilaterally, no respiratory distress Abdomen:  BS+, soft, non-tender, non-distended, no masses or organomegaly noted Rectal: Deferred Musculoskeletal:  symmetrical muscle groups noted without atrophy or deformity Neuro:  gait normal; deep tendon reflexes normal and symmetric Psych: well oriented with normal range of affect and appropriate judgment/insight  Assessment and Plan  Well adult exam - Plan: CBC, Comprehensive metabolic panel with GFR, Lipid panel  Screening for prostate cancer - Plan: PSA  Type 2 diabetes mellitus with hyperglycemia, without long-term current use of insulin  (HCC) - Plan: Hemoglobin A1c, Microalbumin / creatinine urine ratio  Need for pneumococcal vaccine - Plan: Pneumococcal conjugate vaccine 20-valent (Prevnar 20)  B12 deficiency - Plan: cyanocobalamin  (VITAMIN B12) injection 1,000 mcg   Well 58 y.o. male. Counseled on diet and exercise. Counseled on risks and benefits of prostate cancer screening with PSA. The patient agrees to undergo testing. Advanced directive form provided today.  Flu shot politely declined.  Shingrix recommended to get at pharmacy. PCV20 today.  Immunizations, labs, and further orders as above. Follow up in 6 mo. The patient voiced understanding and agreement to the plan.  Mabel Mt [place], DO 08/25/2024 8:46 AM     [1]  Current Outpatient Medications on File Prior to Visit  Medication Sig Dispense Refill   ARIPiprazole  (ABILIFY ) 10 MG tablet Take 1 tablet (10 mg total) by mouth daily. 90 tablet 0   ARIPiprazole  (ABILIFY ) 10 MG tablet Take 1 tablet (10 mg total) by mouth daily. 90 tablet 0   Blood Glucose Monitoring Suppl (CONTOUR NEXT EZ) w/Device KIT Use daily to check blood sugars 1 kit 0   empagliflozin  (JARDIANCE ) 25 MG TABS tablet Take 1 tablet (25 mg total) by mouth daily. 90 tablet 1   ezetimibe  (ZETIA ) 10 MG tablet Take 1 tablet (10 mg total) by mouth daily. 90 tablet 1   glucose blood (CONTOUR NEXT TEST) test strip Use 1 strip daily to check blood sugar.  100 each 12   latanoprost  (XALATAN ) 0.005 % ophthalmic solution Place 1 drop into both eyes at bedtime.      lidocaine  (XYLOCAINE ) 2 % solution Use as directed 15 mLs in the mouth or throat every 4 (four) hours as needed for mouth pain. 100 mL 0   meloxicam  (MOBIC ) 15 MG tablet Take 1 tablet (15 mg total) by mouth daily as needed. 45 tablet 1   metFORMIN  (GLUCOPHAGE -XR) 500 MG 24 hr tablet Take 2 tablets (1,000 mg total) by mouth 2 (two) times daily with a meal. 360 tablet 2   Microlet Lancets MISC Use daily to check blood sugar. 100 each 3   pioglitazone  (ACTOS ) 45 MG tablet Take 1 tablet (45 mg total) by mouth daily. 90 tablet 1   rosuvastatin  (CRESTOR ) 40 MG tablet Take 1 tablet (40 mg total) by mouth daily. 90 tablet 3   Semaglutide  (RYBELSUS ) 14 MG TABS Take 1 tablet (14 mg total) by mouth daily. 90  tablet 0   Vitamin D , Ergocalciferol , (DRISDOL ) 1.25 MG (50000 UNIT) CAPS capsule Take 1 capsule (50,000 Units total) by mouth every 7 (seven) days. 5 capsule 3   No current facility-administered medications on file prior to visit.  [2]  Allergies Allergen Reactions   Fenofibrate  Rash   Penicillins Rash    Childhood allergy Has patient had a PCN reaction causing immediate rash, facial/tongue/throat swelling, SOB or lightheadedness with hypotension: Yes Has patient had a PCN reaction causing severe rash involving mucus membranes or skin necrosis: No Has patient had a PCN reaction that required hospitalization: No Has patient had a PCN reaction occurring within the last 10 years: No If all of the above answers are NO, then may proceed with Cephalosporin use.

## 2024-09-07 ENCOUNTER — Ambulatory Visit: Payer: Self-pay | Admitting: Family Medicine

## 2024-09-07 ENCOUNTER — Other Ambulatory Visit (INDEPENDENT_AMBULATORY_CARE_PROVIDER_SITE_OTHER)

## 2024-09-07 DIAGNOSIS — R748 Abnormal levels of other serum enzymes: Secondary | ICD-10-CM | POA: Diagnosis not present

## 2024-09-07 LAB — HEPATIC FUNCTION PANEL
ALT: 44 U/L (ref 3–53)
AST: 31 U/L (ref 5–37)
Albumin: 4.5 g/dL (ref 3.5–5.2)
Alkaline Phosphatase: 60 U/L (ref 39–117)
Bilirubin, Direct: 0.2 mg/dL (ref 0.1–0.3)
Total Bilirubin: 0.9 mg/dL (ref 0.2–1.2)
Total Protein: 7 g/dL (ref 6.0–8.3)

## 2024-09-30 ENCOUNTER — Other Ambulatory Visit (HOSPITAL_BASED_OUTPATIENT_CLINIC_OR_DEPARTMENT_OTHER): Payer: Self-pay

## 2024-09-30 MED ORDER — ARIPIPRAZOLE 10 MG PO TABS
10.0000 mg | ORAL_TABLET | Freq: Every day | ORAL | 0 refills | Status: AC
  Filled 2024-09-30: qty 90, 90d supply, fill #0
